# Patient Record
Sex: Male | Born: 1960 | Race: White | Hispanic: No | Smoking: Never smoker
Health system: Southern US, Community
[De-identification: ages and names within clinical notes are randomized; demographics above are authoritative.]

---

## 2010-08-05 DIAGNOSIS — Z86711 Personal history of pulmonary embolism: Secondary | ICD-10-CM | POA: Insufficient documentation

## 2012-06-28 DIAGNOSIS — I10 Essential (primary) hypertension: Secondary | ICD-10-CM | POA: Insufficient documentation

## 2012-07-02 DIAGNOSIS — E119 Type 2 diabetes mellitus without complications: Secondary | ICD-10-CM | POA: Insufficient documentation

## 2012-09-27 DIAGNOSIS — R011 Cardiac murmur, unspecified: Secondary | ICD-10-CM | POA: Insufficient documentation

## 2013-09-12 DIAGNOSIS — N529 Male erectile dysfunction, unspecified: Secondary | ICD-10-CM | POA: Insufficient documentation

## 2014-03-01 DIAGNOSIS — Z7901 Long term (current) use of anticoagulants: Secondary | ICD-10-CM | POA: Insufficient documentation

## 2019-01-15 ENCOUNTER — Encounter: Payer: Self-pay | Admitting: Psychiatry

## 2019-01-15 ENCOUNTER — Ambulatory Visit (INDEPENDENT_AMBULATORY_CARE_PROVIDER_SITE_OTHER): Payer: 59 | Admitting: Psychiatry

## 2019-01-15 ENCOUNTER — Other Ambulatory Visit: Payer: Self-pay

## 2019-01-15 DIAGNOSIS — F4323 Adjustment disorder with mixed anxiety and depressed mood: Secondary | ICD-10-CM
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
## 2019-01-22 ENCOUNTER — Ambulatory Visit (INDEPENDENT_AMBULATORY_CARE_PROVIDER_SITE_OTHER): Payer: 59 | Admitting: Psychiatry

## 2019-01-22 ENCOUNTER — Other Ambulatory Visit: Payer: Self-pay

## 2019-01-22 ENCOUNTER — Encounter: Payer: Self-pay | Admitting: Psychiatry

## 2019-01-22 DIAGNOSIS — F4323 Adjustment disorder with mixed anxiety and depressed mood: Secondary | ICD-10-CM

## 2019-01-22 NOTE — Progress Notes (Signed)
      [facility] Counselor/Therapist Progress Note  Patient ID: Allen Gregory, MRN: [id number],    Date: 01/22/2019  Time Spent: 50 minutes   Treatment Type: Individual Therapy  Reported Symptoms: anxiety, sadness.  Mental Status Exam:  Appearance:   Casual     Behavior:  Appropriate  Motor:  Normal  Speech/Language:   Clear and Coherent  Affect:  Appropriate  Mood:  anxious and sad  Thought process:  normal  Thought content:    WNL  Sensory/Perceptual disturbances:    WNL  Orientation:  oriented to person, place, time/date and situation  Attention:  Good  Concentration:  Good  Memory:  WNL  Fund of knowledge:   Good  Insight:    Good  Judgment:   Good  Impulse Control:  Good   Risk Assessment: Danger to Self:  No Self-injurious Behavior: No Danger to Others: No Duty to Warn:no Physical Aggression / Violence:No  Access to Firearms a concern: No  Gang Involvement:No   Subjective: The client states that he has done some guided meditations online which have been very helpful.  We also discussed mindfulness skills.  I gave the client a handout on different things that he could do.  We also discussed thinking errors and ways to set appropriate boundaries with others.  The client has handouts on both subjects. Today we started with eye-movement around his anxiety with sexual performance.  His negative cognition was, "I am failing.  I am not a man."  His level of distress was a 6+.  At the end of the session it was less than 3.  The client was able to note that he has been successful sexually in the past.  We discussed being able to stay in the present tense and not worry about what his body is doing but what his body is feeling.  As the client used the hand paddles he was able to decrease his anxiety to below a 3.  I took the client through mindfulness exercise which helped him relax.  I also discussed with him the 1-2 ratio to use with breathing when he practices that.  He states  he will try to do the mindfulness practices twice a day.  Interventions: Assertiveness/Communication, Mindfulness Meditation, Motivational Interviewing, Solution-Oriented/Positive Psychology, [facility] Desensitization and Reprocessing (EMDR) and Insight-Oriented  Diagnosis:   ICD-10-CM   1. Adjustment disorder with mixed anxiety and depressed mood  F43.23     Plan: Mindfulness, boundaries, assertiveness, thinking errors, exercise.  This record has been created using [employer].  Chart creation errors have been sought, but Adaysha Dubinsky not always have been located and corrected. Such creation errors do not reflect on the standard of medical care.  Nazanin Kinner, [facility]

## 2019-02-13 ENCOUNTER — Encounter: Payer: Self-pay | Admitting: Psychiatry

## 2019-02-13 ENCOUNTER — Ambulatory Visit (INDEPENDENT_AMBULATORY_CARE_PROVIDER_SITE_OTHER): Payer: 59 | Admitting: Psychiatry

## 2019-02-13 ENCOUNTER — Other Ambulatory Visit: Payer: Self-pay

## 2019-02-13 DIAGNOSIS — F4323 Adjustment disorder with mixed anxiety and depressed mood: Secondary | ICD-10-CM | POA: Diagnosis not present
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
## 2019-02-19 ENCOUNTER — Ambulatory Visit: Payer: 59 | Admitting: Psychiatry

## 2019-03-02 ENCOUNTER — Ambulatory Visit: Payer: 59 | Admitting: Psychiatry

## 2019-03-09 ENCOUNTER — Ambulatory Visit: Payer: 59 | Admitting: Psychiatry

## 2019-03-16 ENCOUNTER — Ambulatory Visit: Payer: 59 | Admitting: Psychiatry

## 2019-07-09 ENCOUNTER — Other Ambulatory Visit: Payer: Self-pay

## 2019-07-09 ENCOUNTER — Encounter (HOSPITAL_BASED_OUTPATIENT_CLINIC_OR_DEPARTMENT_OTHER): Payer: 59 | Attending: Internal Medicine | Admitting: Internal Medicine

## 2019-07-09 DIAGNOSIS — Z7901 Long term (current) use of anticoagulants: Secondary | ICD-10-CM | POA: Insufficient documentation

## 2019-07-09 DIAGNOSIS — Z96641 Presence of right artificial hip joint: Secondary | ICD-10-CM | POA: Diagnosis not present

## 2019-07-09 DIAGNOSIS — L97512 Non-pressure chronic ulcer of other part of right foot with fat layer exposed: Secondary | ICD-10-CM | POA: Diagnosis not present

## 2019-07-09 DIAGNOSIS — E1142 Type 2 diabetes mellitus with diabetic polyneuropathy: Secondary | ICD-10-CM | POA: Diagnosis not present

## 2019-07-09 DIAGNOSIS — I10 Essential (primary) hypertension: Secondary | ICD-10-CM | POA: Diagnosis not present

## 2019-07-09 DIAGNOSIS — E1151 Type 2 diabetes mellitus with diabetic peripheral angiopathy without gangrene: Secondary | ICD-10-CM | POA: Diagnosis not present

## 2019-07-09 DIAGNOSIS — E11621 Type 2 diabetes mellitus with foot ulcer: Secondary | ICD-10-CM | POA: Diagnosis present

## 2019-07-09 DIAGNOSIS — Z86711 Personal history of pulmonary embolism: Secondary | ICD-10-CM | POA: Insufficient documentation

## 2019-07-09 DIAGNOSIS — Z86718 Personal history of other venous thrombosis and embolism: Secondary | ICD-10-CM | POA: Diagnosis not present

## 2019-07-09 NOTE — Progress Notes (Signed)
Allen, Gregory ([id number]) Visit Report for 07/09/2019 Abuse/Suicide Risk Screen Details Patient Name: Date of Service: Allen Gregory, Allen Gregory 07/09/2019 2:45 PM Medical Record [id number] Patient Account Number: [id number] Date of Birth/Sex: Treating RN: 31-Aug-1960 (59 y.o. Male) Baruch Gouty Primary Care Esther Broyles: PATIENT, NO Other Clinician: Referring Kentrell Guettler: Treating Salvadore Valvano/Extender:Robson, Esperanza Richters in Treatment: 0 Abuse/Suicide Risk Screen Items Answer ABUSE RISK SCREEN: Has anyone close to you tried to hurt or harm you recentlyo No Do you feel uncomfortable with anyone in your familyo No Electronic Signature(s) Signed: 07/09/2019 5:35:37 PM By: Baruch Gouty RN, BSN Entered By: Baruch Gouty on 07/09/2019 15:03:11 -------------------------------------------------------------------------------- Activities of Daily Living Details Patient Name: Date of Service: Allen, Gregory 07/09/2019 2:45 PM Medical Record [id number] Patient Account Number: [id number] Date of Birth/Sex: Treating RN: [birth year] (59 y.o. Male) Baruch Gouty Primary Care Jaton Eilers: PATIENT, NO Other Clinician: Referring Davonte Siebenaler: Treating La Shehan/Extender:Robson, Esperanza Richters in Treatment: 0 Activities of Daily Living Items Answer Activities of Daily Living (Please select one for each item) Drive Automobile Completely Able Take Medications Completely Able Use Telephone Completely Able Care for Appearance Completely Able Use Toilet Completely Able Bath / Shower Completely Able Dress Self Completely Able Feed Self Completely Able Walk Completely Able Get In / Out Bed Completely Able Housework Completely Able Prepare Meals Completely Able Handle Money Completely Able Shop for Self Completely Able Electronic Signature(s) Signed: 07/09/2019 5:35:37 PM By: Baruch Gouty RN, BSN Entered By: Baruch Gouty on 07/09/2019  15:03:47 -------------------------------------------------------------------------------- Education Screening Details Patient Name: Date of Service: Allen, Gregory 07/09/2019 2:45 PM Medical Record [id number] Patient Account Number: [id number] Date of Birth/Sex: Treating RN: [birth year] (59 y.o. Male) Baruch Gouty Primary Care Parveen Freehling: PATIENT, NO Other Clinician: Referring Makila Colombe: Treating Gwynneth Fabio/Extender:Robson, Esperanza Richters in Treatment: 0 Primary Learner Assessed: Patient Learning Preferences/Education Level/Primary Language Learning Preference: Explanation, Demonstration, Printed Material Highest Education Level: College or Above Preferred Language: Diplomatic Services operational officer Language Barrier: No Translator Needed: No Memory Deficit: No Emotional Barrier: No Cultural/Religious Beliefs Affecting Medical Care: No Physical Barrier Impaired Vision: Yes Contacts Impaired Hearing: No Decreased Hand dexterity: No Knowledge/Comprehension Knowledge Level: High Comprehension Level: High Ability to understand written High instructions: Ability to understand verbal High instructions: Motivation Anxiety Level: Calm Cooperation: Cooperative Education Importance: Acknowledges Need Interest in Health Problems: Asks Questions Perception: Coherent Willingness to Engage in Self- High Management Activities: Readiness to Engage in Self- High Management Activities: Electronic Signature(s) Signed: 07/09/2019 5:35:37 PM By: Baruch Gouty RN, BSN Entered By: Baruch Gouty on 07/09/2019 15:04:16 -------------------------------------------------------------------------------- Fall Risk Assessment Details Patient Name: Date of Service: Allen, Gregory 07/09/2019 2:45 PM Medical Record [id number] Patient Account Number: [id number] Date of Birth/Sex: Treating RN: 1960-10-01 (59 y.o. Male) Baruch Gouty Primary Care Daxon Kyne: PATIENT, NO Other Clinician: Referring  Lore Polka: Treating Emran Molzahn/Extender:Robson, Esperanza Richters in Treatment: 0 Fall Risk Assessment Items Have you had 2 or more falls in the last 12 monthso 0 No Have you had any fall that resulted in injury in the last 12 monthso 0 No FALLS RISK SCREEN History of falling - immediate or within 3 months 0 No Secondary diagnosis (Do you have 2 or more medical diagnoseso) 0 No Ambulatory aid None/bed rest/wheelchair/nurse 0 Yes Crutches/cane/walker 0 No Furniture 0 No Intravenous therapy Access/Saline/Heparin Lock 0 No Gait/Transferring Normal/ bed rest/ wheelchair 0 Yes Impaired (short steps with shuffle, may have difficulty arising from chair, 0 No head down, impaired balance) Mental Status Oriented to own ability 0 Yes Overestimates or forgets limitations 0 No Risk Level: Low Risk Score: 0 Electronic Signature(s) Signed: 07/09/2019  5:35:37 PM By: Zenaida Deed RN, BSN Entered By: Zenaida Deed on 07/09/2019 15:04:46 -------------------------------------------------------------------------------- Foot Assessment Details Patient Name: Date of Service: Allen, Gregory 07/09/2019 2:45 PM Medical Record [id number] Patient Account Number: [id number] Date of Birth/Sex: Treating RN: Dec 31, 1960 (59 y.o. Male) Zenaida Deed Primary Care Maxamus Colao: PATIENT, NO Other Clinician: Referring Sophi Calligan: Treating Sherrita Riederer/Extender:Robson, [name] in Treatment: 0 Foot Assessment Items Site Locations + = Sensation present, - = Sensation absent, C = Callus, U = Ulcer R = Redness, W = Warmth, M = Maceration, PU = Pre-ulcerative lesion F = Fissure, S = Swelling, D = Dryness Assessment Right: Left: Other Deformity: No No Prior Foot Ulcer: No No Prior Amputation: No No Charcot Joint: No No Ambulatory Status: Ambulatory Without Help Gait: Steady Electronic Signature(s) Signed: 07/09/2019 5:35:37 PM By: Zenaida Deed RN, BSN Entered By: Zenaida Deed on 07/09/2019  15:07:42 -------------------------------------------------------------------------------- Nutrition Risk Screening Details Patient Name: Date of Service: Allen, Gregory 07/09/2019 2:45 PM Medical Record [id number] Patient Account Number: [id number] Date of Birth/Sex: Treating RN: [birth year] (58 y.o. Male) Zenaida Deed Primary Care Tyronica Truxillo: PATIENT, NO Other Clinician: Referring Alisandra Son: Treating Amed Datta/Extender:Robson, [name] in Treatment: 0 Height (in): Weight (lbs): Body Mass Index (BMI): Nutrition Risk Screening Items Score Screening NUTRITION RISK SCREEN: I have an illness or condition that made me change the kind and/or 0 No amount of food I eat I eat fewer than two meals per day 0 No I eat few fruits and vegetables, or milk products 0 No I have three or more drinks of beer, liquor or wine almost every day 0 No I have tooth or mouth problems that make it hard for me to eat 0 No I don't always have enough money to buy the food I need 0 No I eat alone most of the time 1 Yes I take three or more different prescribed or over-the-counter drugs a day 1 Yes 0 No Without wanting to, I have lost or gained 10 pounds in the last six months I am not always physically able to shop, cook and/or feed myself 0 No Nutrition Protocols Good Risk Protocol 0 No interventions needed Moderate Risk Protocol High Risk Proctocol Risk Level: Good Risk Score: 2 Electronic Signature(s) Signed: 07/09/2019 5:35:37 PM By: Zenaida Deed RN, BSN Entered By: Zenaida Deed on 07/09/2019 15:05:23

## 2019-07-10 NOTE — Progress Notes (Signed)
Allen Gregory, Tania ([id number]) Visit Report for 07/09/2019 Chief Complaint Document Details Patient Name: Date of Service: Allen Gregory, Allen Gregory 07/09/2019 2:45 PM Medical Record [id number] Patient Account Number: [id number] Date of Birth/Sex: Treating RN: 03/08/1961 (59 y.o. Male) Zandra AbtsLynch, Shatara Primary Care Provider: PATIENT, NO Other Clinician: Referring Provider: Treating Provider/Extender:Lassie Demorest, Lamar SprinklesMichael Weeks in Treatment: 0 Information Obtained from: Patient Chief Complaint 07/09/2019; patient is here for review of wounds on the right plantar first toe and the dorsal right fifth toe Electronic Signature(s) Signed: 07/10/2019 4:08:35 PM By: Baltazar Najjarobson, Laurali Goddard MD Entered By: Baltazar Najjarobson, Alexandria Current on 07/09/2019 16:37:48 -------------------------------------------------------------------------------- Debridement Details Patient Name: Date of Service: Allen Gregory, Allen Gregory 07/09/2019 2:45 PM Medical Record [id number] Patient Account Number: [id number] Date of Birth/Sex: 06/30/1960 [age] y.o. Male) Treating RN: Zandra AbtsLynch, Shatara Primary Care Provider: PATIENT, NO Other Clinician: Referring Provider: Treating Provider/Extender:Jerni Selmer, Lamar SprinklesMichael Weeks in Treatment: 0 Debridement Performed for Wound #1 Right Toe Great Assessment: Performed By: Physician Maxwell Caulobson, Dailan Pfalzgraf G., MD Debridement Type: Debridement Severity of Tissue Pre Fat layer exposed Debridement: Level of Consciousness (Pre- Awake and Alert procedure): Pre-procedure Verification/Time Out Taken: Yes - 16:20 Start Time: 16:20 Total Area Debrided (L x W): 1 (cm) x 0.6 (cm) = 0.6 (cm) Tissue and other material Viable, Non-Viable, Callus, Subcutaneous debrided: Level: Skin/Subcutaneous Tissue Debridement Description: Excisional Instrument: Curette Bleeding: Minimum Hemostasis Achieved: Pressure End Time: 16:21 Procedural Pain: 0 Post Procedural Pain: 0 Response to Treatment: Procedure was tolerated well Level of Consciousness Awake and  Alert (Post-procedure): Post Debridement Measurements of Total Wound Length: (cm) 1 Width: (cm) 0.6 Depth: (cm) 0.5 Volume: (cm) 0.236 Character of Wound/Ulcer Post Improved Debridement: Severity of Tissue Post Debridement: Fat layer exposed Post Procedure Diagnosis Same as Pre-procedure Electronic Signature(s) Signed: 07/09/2019 6:02:32 PM By: Zandra AbtsLynch, Shatara RN, BSN Signed: 07/10/2019 4:08:35 PM By: Baltazar Najjarobson, Anandi Abramo MD Entered By: Baltazar Najjarobson, Jaquay Morneault on 07/09/2019 16:37:11 -------------------------------------------------------------------------------- Debridement Details Patient Name: Date of Service: Allen Gregory, Allen Gregory 07/09/2019 2:45 PM Medical Record [id number] Patient Account Number: [id number] Date of Birth/Sex: 10/31/1960 [age] y.o. Male) Treating RN: Zandra AbtsLynch, Shatara Primary Care Provider: PATIENT, NO Other Clinician: Referring Provider: Treating Provider/Extender:Gelila Well, Lamar SprinklesMichael Weeks in Treatment: 0 Debridement Performed for Wound #2 Right Toe Fifth Assessment: Performed By: Physician Maxwell Caulobson, Antonieta Slaven G., MD Debridement Type: Debridement Severity of Tissue Pre Fat layer exposed Debridement: Level of Consciousness (Pre- Awake and Alert procedure): Pre-procedure Verification/Time Out Taken: Yes - 16:20 Start Time: 16:22 Total Area Debrided (L x W): 0.4 (cm) x 0.2 (cm) = 0.08 (cm) Tissue and other material Viable, Non-Viable, Callus, Subcutaneous debrided: Level: Skin/Subcutaneous Tissue Debridement Description: Excisional Instrument: Curette Bleeding: Minimum Hemostasis Achieved: Pressure End Time: 16:24 Procedural Pain: 0 Post Procedural Pain: 0 Response to Treatment: Procedure was tolerated well Level of Consciousness Awake and Alert (Post-procedure): Post Debridement Measurements of Total Wound Length: (cm) 0.4 Width: (cm) 0.2 Depth: (cm) 0.1 Volume: (cm) 0.006 Character of Wound/Ulcer Post Improved Debridement: Severity of Tissue Post Debridement: Fat  layer exposed Post Procedure Diagnosis Same as Pre-procedure Electronic Signature(s) Signed: 07/09/2019 6:02:32 PM By: Zandra AbtsLynch, Shatara RN, BSN Signed: 07/10/2019 4:08:35 PM By: Baltazar Najjarobson, Ayson Cherubini MD Entered By: Baltazar Najjarobson, Keelia Graybill on 07/09/2019 16:37:19 -------------------------------------------------------------------------------- HPI Details Patient Name: Date of Service: Allen Gregory, Allen Gregory 07/09/2019 2:45 PM Medical Record [id number] Patient Account Number: [id number] Date of Birth/Sex: Treating RN: 12/27/1960 (59 y.o. Male) Zandra AbtsLynch, Shatara Primary Care Provider: PATIENT, NO Other Clinician: Referring Provider: Treating Provider/Extender:Shareen Capwell, Lamar SprinklesMichael Weeks in Treatment: 0 History of Present Illness HPI Description: ADMISSION 07/09/2019 This is a pleasant 59 year old man who referred himself here for second opinion  sign a new area on the right plantar first toe and the dorsal part of his right fifth toe. He says he had these in August when he had removed callus from the first toe and then played pool volleyball for about 4 hours. He thinks the bottom of the pool simply caused an abrasion of the toe. The story sounds accurate. He has been going to podiatry for the last several months. Me a picture on his phone from August and the wound is gotten considerably smaller. He states that it started doing well recently when they started collagen. He has been using a surgical shoe to offload. 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 He had a brace for a long time but now he simply lifts his foot higher to clear but states that most people would not even notice that he had any disability. He is a type 2 diabetes Badik but he has diet-controlled he has lost weight by watching calories. Past medical history type 2 diabetes diet controlled, right foot  drop is noted, right total hip replacement x2, history of PE on Xarelto chronically ABI in our clinic was 1.03 on the right. Electronic Signature(s) Signed: 07/10/2019 4:08:35 PM By: Baltazar Najjar MD Entered By: Baltazar Najjar on 07/09/2019 16:40:40 -------------------------------------------------------------------------------- Physical Exam Details Patient Name: Date of Service: Allen Gregory, Allen Gregory 07/09/2019 2:45 PM Medical Record [id number] Patient Account Number: [id number] Date of Birth/Sex: Treating RN: 01/03/1961 (59 y.o. Male) Zandra Abts Primary Care Provider: PATIENT, NO Other Clinician: Referring Provider: Treating Provider/Extender:Annaleah Arata, [name] in Treatment: 0 Constitutional Patient is hypertensive.. Pulse regular and within target range for patient.Marland Kitchen Respirations regular, non-labored and within target range.. Temperature is normal and within the target range for the patient.Marland Kitchen Appears in no distress. Respiratory work of breathing is normal. Cardiovascular Both dorsalis pedis posterior tibial and popliteal pulses are robust. Neurological Patient has right foot drop however he manages to do fairly well with his surgical shoe. Psychiatric appears at normal baseline. Notes Wound exam; the patient's wound is on the plantar right great toe over the interphalangeal joint. Raised edges of skin and subcutaneous tissue removed with a #3 curette. The base of this actually looks quite healthy. There is no exposed bone no overt evidence of surrounding infection. Over the fifth toe dorsally over the possible inner phalangeal joint callused skin removed with a #3 curette there is still a small open area removed remaining here. Electronic Signature(s) Signed: 07/10/2019 4:08:35 PM By: Baltazar Najjar MD Entered By: Baltazar Najjar on 07/09/2019 16:56:30 -------------------------------------------------------------------------------- Physician Orders Details Patient  Name: Date of Service: Allen Gregory, Allen Gregory 07/09/2019 2:45 PM Medical Record [id number] Patient Account Number: [id number] Date of Birth/Sex: Treating RN: 02-25-1961 (58 y.o. Male) Zandra Abts Primary Care Provider: PATIENT, NO Other Clinician: Referring Provider: Treating Provider/Extender:Carys Malina, [name] in Treatment: 0 Verbal / Phone Orders: No Diagnosis Coding Follow-up Appointments Return Appointment in 1 week. Dressing Change Frequency Wound #1 Right Toe Great Change Dressing every other day. - may change daily if needed for excess drainage Wound #2 Right Toe Fifth Change Dressing every other day. - may change daily if needed for excess drainage Wound Cleansing May shower and wash wound with soap and water. - on days that dressing is changed Primary Wound Dressing Wound #1 Right Toe Great Silver Collagen - moisten with hydrogel (or KY jelly) Wound #2 Right  Toe Fifth Silver Collagen - moisten with hydrogel (or KY jelly) Secondary Dressing Wound #1 Right Toe Great Foam - foam donut Kerlix/Rolled Gauze Dry Gauze Other: - felt in surgical shoe Wound #2 Right Toe Fifth Foam - foam donut Kerlix/Rolled Gauze Dry Gauze Other: - felt in surgical shoe Electronic Signature(s) Signed: 07/09/2019 6:02:32 PM By: Zandra AbtsLynch, Shatara RN, BSN Signed: 07/10/2019 4:08:35 PM By: Baltazar Najjarobson, Micai Apolinar MD Entered By: Zandra AbtsLynch, Shatara on 07/09/2019 16:27:11 -------------------------------------------------------------------------------- Problem List Details Patient Name: Date of Service: Allen Gregory, Allen Gregory 07/09/2019 2:45 PM Medical Record [id number] Patient Account Number: [id number] Date of Birth/Sex: Treating RN: 10/26/1960 (59 y.o. Male) Zandra AbtsLynch, Shatara Primary Care Provider: PATIENT, NO Other Clinician: Referring Provider: Treating Provider/Extender:Liah Morr, Lamar SprinklesMichael Weeks in Treatment: 0 Active Problems ICD-10 Evaluated Encounter Code Description Active Date Today Diagnosis E11.621  Type 2 diabetes mellitus with foot ulcer 07/09/2019 No Yes L97.512 Non-pressure chronic ulcer of other part of right foot 07/09/2019 No Yes with fat layer exposed E11.42 Type 2 diabetes mellitus with diabetic polyneuropathy 07/09/2019 No Yes M21.371 Foot drop, right foot 07/09/2019 No Yes Inactive Problems Resolved Problems Electronic Signature(s) Signed: 07/10/2019 4:08:35 PM By: Baltazar Najjarobson, Brynlynn Walko MD Entered By: Baltazar Najjarobson, Reace Breshears on 07/09/2019 16:36:50 -------------------------------------------------------------------------------- Progress Note Details Patient Name: Date of Service: Allen Gregory, Allen Gregory 07/09/2019 2:45 PM Medical Record [id number] Patient Account Number: [id number] Date of Birth/Sex: Treating RN: 01/01/1961 (59 y.o. Male) Zandra AbtsLynch, Shatara Primary Care Provider: PATIENT, NO Other Clinician: Referring Provider: Treating Provider/Extender:Olivier Frayre, Lamar SprinklesMichael Weeks in Treatment: 0 Subjective Chief Complaint Information obtained from Patient 07/09/2019; patient is here for review of wounds on the right plantar first toe and the dorsal right fifth toe History of Present Illness (HPI) ADMISSION 07/09/2019 This is a pleasant 59 year old man who referred himself here for second opinion sign a new area on the right plantar first toe and the dorsal part of his right fifth toe. He says he had these in August when he had removed callus from the first toe and then played pool volleyball for about 4 hours. He thinks the bottom of the pool simply caused an abrasion of the toe. The story sounds accurate. He has been going to podiatry for the last several months. Me a picture on his phone from August and the wound is gotten considerably smaller. He states that it started doing well recently when they started collagen. He has been using a surgical shoe to offload. 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 He had a brace for a long time but now he simply lifts his foot higher to clear but states that most people would not even notice that he had any disability. He is a type 2 diabetes Badik but he has diet-controlled he has lost weight by watching calories. 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 Patient History Information obtained from Patient. Allergies lisinopril (Reaction: cough) Family History Cancer - Maternal Grandparents,Paternal Grandparents, Diabetes - Mother, Heart Disease - Father, Hypertension - Father,Siblings, No family history of Kidney Disease, Lung Disease, Seizures, Stroke, Thyroid Problems, Tuberculosis. Social History Never smoker, Marital Status - Single, Alcohol Use - Moderate - 4-5 drinks per week, Drug Use - No History, Caffeine Use - Daily - coffee, diet coke. Medical History Respiratory  Patient has history of Pneumothorax - CPAP Cardiovascular Patient has history of Deep Vein Thrombosis Endocrine Patient has history of Type II Diabetes Denies history of Type I Diabetes Genitourinary Denies history of End Stage Renal Disease Integumentary (Skin) Denies history of History of Burn Neurologic Patient has history of Neuropathy Oncologic Denies history of Received Chemotherapy, Received Radiation Psychiatric Denies history of Anorexia/bulimia, Confinement Anxiety Patient is treated with Controlled Diet. Blood sugar is not tested. Hospitalization/Surgery History - right hip replacement x2. - hydrocele repair. Medical And Surgical History Notes Cardiovascular hx PE Neurologic right foot drop Review of Systems (ROS) Constitutional Symptoms (General Health) Denies complaints or symptoms of Fatigue, Fever, Chills, Marked Weight  Change. Eyes Complains or has symptoms of Glasses / Contacts - contacts. Denies complaints or symptoms of Dry Eyes, Vision Changes. Ear/Nose/Mouth/Throat Denies complaints or symptoms of Chronic sinus problems or rhinitis. Respiratory Denies complaints or symptoms of Chronic or frequent coughs, Shortness of Breath. Cardiovascular Denies complaints or symptoms of Chest pain. Gastrointestinal Denies complaints or symptoms of Frequent diarrhea, Nausea, Vomiting. Genitourinary Denies complaints or symptoms of Frequent urination. Integumentary (Skin) Complains or has symptoms of Wounds - right great toe. Musculoskeletal Complains or has symptoms of Muscle Weakness - right leg. Denies complaints or symptoms of Muscle Pain. Neurologic Complains or has symptoms of Numbness/parasthesias. Psychiatric Denies complaints or symptoms of Claustrophobia, Suicidal. Objective Constitutional Patient is hypertensive.. Pulse regular and within target range for patient.Marland Kitchen Respirations regular, non-labored and within target range.. Temperature is normal and within the target range for the patient.Marland Kitchen Appears in no distress. Vitals Time Taken: 3:00 PM, Height: 78 in, Source: Stated, Weight: 285 lbs, Source: Stated, BMI: 32.9, Temperature: 98.6 F, Pulse: 79 bpm, Respiratory Rate: 18 breaths/min, Blood Pressure: 149/76 mmHg. Respiratory work of breathing is normal. Cardiovascular Both dorsalis pedis posterior tibial and popliteal pulses are robust. Neurological Patient has right foot drop however he manages to do fairly well with his surgical shoe. Psychiatric appears at normal baseline. General Notes: Wound exam; the patient's wound is on the plantar right great toe over the interphalangeal joint. Raised edges of skin and subcutaneous tissue removed with a #3 curette. The base of this actually looks quite healthy. There is no exposed bone no overt evidence of surrounding infection. ooOver the fifth toe  dorsally over the possible inner phalangeal joint callused skin removed with a #3 curette there is still a small open area removed remaining here. Integumentary (Hair, Skin) Wound #1 status is Open. Original cause of wound was Gradually Appeared. The wound is located on the Right Toe Great. The wound measures 1cm length x 0.6cm width x 0.5cm depth; 0.471cm^2 area and 0.236cm^3 volume. There is Fat Layer (Subcutaneous Tissue) Exposed exposed. There is no tunneling noted, however, there is undermining starting at 12:00 and ending at 12:00 with a maximum distance of 0.4cm. There is a small amount of serosanguineous drainage noted. The wound margin is thickened. There is large (67-100%) red granulation within the wound bed. There is no necrotic tissue within the wound bed. Wound #2 status is Open. Original cause of wound was Trauma. The wound is located on the Right Toe Fifth. The wound measures 0.4cm length x 0.2cm width x 0.1cm depth; 0.063cm^2 area and 0.006cm^3 volume. The wound is limited to skin breakdown. There is no tunneling or undermining noted. There is a none present amount of drainage noted. The wound margin is flat and intact. There is no granulation within the wound bed. There is a small (1-33%) amount  of necrotic tissue within the wound bed including Eschar. 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 There was a Excisional Skin/Subcutaneous Tissue Debridement with a total area of 0.6 sq cm performed by Maxwell Caul., MD. With the following instrument(s): Curette to remove Viable and Non-Viable  tissue/material. Material removed includes Callus and Subcutaneous Tissue and. No specimens were taken. A time out was conducted at 16:20, prior to the start of the procedure. A Minimum amount of bleeding was controlled with Pressure. The procedure was tolerated well with a pain level of 0 throughout and a pain level of 0 following the procedure. Post Debridement Measurements: 1cm length x 0.6cm width x 0.5cm depth; 0.236cm^3 volume. Character of Wound/Ulcer Post Debridement is improved. Severity of Tissue Post Debridement is: Fat layer exposed. Post procedure Diagnosis Wound #1: Same as Pre-Procedure Wound #2 Pre-procedure diagnosis of Wound #2 is a Diabetic Wound/Ulcer of the Lower Extremity located on the Right Toe Fifth .Severity of Tissue Pre Debridement is: Fat layer exposed. There was a Excisional Skin/Subcutaneous Tissue Debridement with a total area of 0.08 sq cm performed by Maxwell Caul., MD. With the following instrument(s): Curette to remove Viable and Non-Viable tissue/material. Material removed includes Callus and Subcutaneous Tissue and. No specimens were taken. A time out was conducted at 16:20, prior to the start of the procedure. A Minimum amount of bleeding was controlled with Pressure. The procedure was tolerated well with a pain level of 0 throughout and a pain level of 0 following the procedure. Post Debridement Measurements: 0.4cm length x 0.2cm width x 0.1cm depth; 0.006cm^3 volume. Character of Wound/Ulcer Post Debridement is improved. Severity of Tissue Post Debridement is: Fat layer exposed. Post procedure Diagnosis Wound #2: Same as Pre-Procedure Plan Follow-up Appointments: Return Appointment in 1 week. Dressing Change Frequency: Wound #1 Right Toe Great: Change Dressing every other day. - may change daily if needed for excess drainage Wound #2 Right Toe Fifth: Change Dressing every other day. - may change daily if needed for excess drainage Wound  Cleansing: May shower and wash wound with soap and water. - on days that dressing is changed Primary Wound Dressing: Wound #1 Right Toe Great: Silver Collagen - moisten with hydrogel (or KY jelly) Wound #2 Right Toe Fifth: Silver Collagen - moisten with hydrogel (or KY jelly) Secondary Dressing: Wound #1 Right Toe Great: Foam - foam donut Kerlix/Rolled Gauze Dry Gauze Other: - felt in surgical shoe Wound #2 Right Toe Fifth: Foam - foam donut Kerlix/Rolled Gauze Dry Gauze Other: - felt in surgical shoe 1. I agree with the silver collagen 2. We felted up his surgical shoe to offload the right great toe 3. He states that he had an x-ray about 2 weeks ago and was told that there is "no osteomyelitis". Again this was at podiatry are not available for review 4. I watched his gait he certainly is not that unsteady. I wonder whether he could manage a total contact cast or not. He is offloading this with a scooter at home but he is still going out to grocery stores etc. 5. I saw no evidence of an arterial issue at the bedside or  based on the ABI in this clinic. I spent 35 minutes in the review of this patient's records, face-to-face review and preparation of this record. Electronic Signature(s) Signed: 07/10/2019 4:08:35 PM By: Baltazar Najjar MD Entered By: Baltazar Najjar on 07/09/2019 16:59:04 -------------------------------------------------------------------------------- HxROS Details Patient Name: Date of Service: Allen Gregory, Allen Gregory 07/09/2019 2:45 PM Medical Record [id number] Patient Account Number: [id number] Date of Birth/Sex: Treating RN: 1961/01/28 (58 y.o. Male) Allen Gregory Primary Care Provider: PATIENT, NO Other Clinician: Referring Provider: Treating Provider/Extender:Laira Penninger, [name] in Treatment: 0 Information Obtained From Patient Constitutional Symptoms (General Health) Complaints and Symptoms: Negative for: Fatigue; Fever; Chills; Marked Weight  Change Eyes Complaints and Symptoms: Positive for: Glasses / Contacts - contacts Negative for: Dry Eyes; Vision Changes Ear/Nose/Mouth/Throat Complaints and Symptoms: Negative for: Chronic sinus problems or rhinitis Respiratory Complaints and Symptoms: Negative for: Chronic or frequent coughs; Shortness of Breath Medical History: Positive for: Pneumothorax - CPAP Cardiovascular Complaints and Symptoms: Negative for: Chest pain Medical History: Positive for: Deep Vein Thrombosis Past Medical History Notes: hx PE Gastrointestinal Complaints and Symptoms: Negative for: Frequent diarrhea; Nausea; Vomiting Genitourinary Complaints and Symptoms: Negative for: Frequent urination Medical History: Negative for: End Stage Renal Disease Integumentary (Skin) Complaints and Symptoms: Positive for: Wounds - right great toe Medical History: Negative for: History of Burn Musculoskeletal Complaints and Symptoms: Positive for: Muscle Weakness - right leg Negative for: Muscle Pain Neurologic Complaints and Symptoms: Positive for: Numbness/parasthesias Medical History: Positive for: Neuropathy Past Medical History Notes: right foot drop Psychiatric Complaints and Symptoms: Negative for: Claustrophobia; Suicidal Medical History: Negative for: Anorexia/bulimia; Confinement Anxiety Hematologic/Lymphatic Endocrine Medical History: Positive for: Type II Diabetes Negative for: Type I Diabetes Time with diabetes: 1 yr Treated with: Diet Blood sugar tested every day: No Immunological Oncologic Medical History: Negative for: Received Chemotherapy; Received Radiation Immunizations Pneumococcal Vaccine: Received Pneumococcal Vaccination: No Implantable Devices Yes Hospitalization / Surgery History Type of Hospitalization/Surgery right hip replacement x2 hydrocele repair Family and Social History Cancer: Yes - Maternal Grandparents,Paternal Grandparents; Diabetes: Yes - Mother;  Heart Disease: Yes - Father; Hypertension: Yes - Father,Siblings; Kidney Disease: No; Lung Disease: No; Seizures: No; Stroke: No; Thyroid Problems: No; Tuberculosis: No; Never smoker; Marital Status - Single; Alcohol Use: Moderate - 4-5 drinks per week; Drug Use: No History; Caffeine Use: Daily - coffee, diet coke; Financial Concerns: No; Food, Clothing or Shelter Needs: No; Support System Lacking: No; Transportation Concerns: No Psychologist, prison and probation services) Signed: 07/09/2019 5:35:37 PM By: Allen Deed RN, BSN Signed: 07/10/2019 4:08:35 PM By: Baltazar Najjar MD Entered By: Allen Gregory on 07/09/2019 15:02:55 -------------------------------------------------------------------------------- SuperBill Details Patient Name: Date of Service: PRAISE, DOLECKI 07/09/2019 Medical Record [id number] Patient Account Number: [id number] Date of Birth/Sex: Treating RN: September 06, 1960 (59 y.o. 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 M21.371 Foot drop, right foot Facility Procedures CPT4 Code Description: 57846962 99213 - WOUND CARE VISIT-LEV 3 EST PT Modifier: 25 Quantity: 1 CPT4 Code Description: 95284132 11042 - DEB SUBQ TISSUE 20 SQ CM/< ICD-10 Diagnosis Description L97.512 Non-pressure chronic ulcer of other part of right foot with Modifier: fat layer expo Quantity: 1 sed Physician Procedures CPT4 Code Description: 4401027 WC PHYS LEVEL 3 NEW PT ICD-10 Diagnosis Description E11.621 Type 2 diabetes mellitus with foot ulcer L97.512  Non-pressure chronic ulcer of other part of right foot w E11.42 Type 2 diabetes mellitus with diabetic   polyneuropathy M21.371 Foot drop, right foot Modifier: 25 ith fat layer e Quantity: 1 xposed CPT4 Code Description: 4650354 11042 - WC PHYS SUBQ TISS 20 SQ CM ICD-10 Diagnosis Description L97.512 Non-pressure chronic ulcer of other part of right foot with Modifier: fat layer expo Quantity: 1 sed Electronic Signature(s) Signed: 07/09/2019 6:02:32 PM By: Zandra Abts RN, BSN Signed: 07/10/2019 4:08:35 PM By: Baltazar Najjar MD Entered By: Zandra Abts on 07/09/2019 17:49:49

## 2019-07-10 NOTE — Progress Notes (Addendum)
GARLAN, DREWES ([id number]) Visit Report for 07/09/2019 Allergy List Details Patient Name: Date of Service: Allen Gregory, Allen Gregory 07/09/2019 2:45 PM Medical Record [id number] Patient Account Number: [id number] Date of Birth/Sex: Treating RN: [birth year] (59 y.o. Male) Zenaida Deed Primary Care Markell Sciascia: PATIENT, NO Other Clinician: Referring Rian Koon: Treating Sura Canul/Extender:Robson, [name] in Treatment: 0 Allergies Active Allergies lisinopril Reaction: cough Allergy Notes Electronic Signature(s) Signed: 07/09/2019 5:35:37 PM By: Zenaida Deed RN, BSN Entered By: Zenaida Deed on 07/09/2019 15:20:10 -------------------------------------------------------------------------------- Arrival Information Details Patient Name: Date of Service: Allen Gregory, Allen Gregory 07/09/2019 2:45 PM Medical Record [id number] Patient Account Number: [id number] Date of Birth/Sex: Treating RN: 09-09-1960 (58 y.o. Male) Zenaida Deed Primary Care Zunairah Devers: PATIENT, NO Other Clinician: Referring Laqueta Bonaventura: Treating Taris Galindo/Extender:Robson, [name] in Treatment: 0 Visit Information Patient Arrived: Ambulatory Arrival Time: 15:18 Accompanied By: self Transfer Assistance: None Patient Identification Verified: Yes Secondary Verification Process Yes Completed: Patient Requires Transmission-Based No Precautions: Patient Has Alerts: Yes Patient Alerts: Patient on Blood Thinner Electronic Signature(s) Signed: 07/09/2019 5:35:37 PM By: Zenaida Deed RN, BSN Entered By: Zenaida Deed on 07/09/2019 15:18:59 -------------------------------------------------------------------------------- Clinic Level of Care Assessment Details Patient Name: Date of Service: Allen Gregory, Allen Gregory 07/09/2019 2:45 PM Medical Record [id number] Patient Account Number: [id number] Date of Birth/Sex: Treating RN: Oct 03, 1960 (58 y.o. Male) Zandra Abts Primary Care Arden Tinoco: PATIENT, NO Other  Clinician: Referring Azaliyah Kennard: Treating Codi Kertz/Extender:Robson, [name] in Treatment: 0 Clinic Level of Care Assessment Items TOOL 1 Quantity Score X - Use when EandM and Procedure is performed on INITIAL visit 1 0 ASSESSMENTS - Nursing Assessment / Reassessment X - General Physical Exam (combine w/ comprehensive assessment (listed just below) 1 20 when performed on new pt. evals) X - Comprehensive Assessment (HX, ROS, Risk Assessments, Wounds Hx, etc.) 1 25 ASSESSMENTS - Wound and Skin Assessment / Reassessment []  - Dermatologic / Skin Assessment (not related to wound area) 0 ASSESSMENTS - Ostomy and/or Continence Assessment and Care []  - Incontinence Assessment and Management 0 []  - Ostomy Care Assessment and Management (repouching, etc.) 0 PROCESS - Coordination of Care X - Simple Patient / Family Education for ongoing care 1 15 []  - Complex (extensive) Patient / Family Education for ongoing care 0 X - Staff obtains Consents, Records, Test Results / Process Orders 1 10 []  - Staff telephones HHA, Nursing Homes / Clarify orders / etc 0 []  - Routine Transfer to another Facility (non-emergent condition) 0 []  - Routine Hospital Admission (non-emergent condition) 0 X - New Admissions / / Ordering NPWT, Apligraf, etc. 1 15 []  - Emergency Hospital Admission (emergent condition) 0 PROCESS - Special Needs []  - Pediatric / Minor Patient Management 0 []  - Isolation Patient Management 0 []  - Hearing / Language / Visual special needs 0 []  - Assessment of Community assistance (transportation, D/C planning, etc.) 0 []  - Additional assistance / Altered mentation 0 []  - Support Surface(s) Assessment (bed, cushion, seat, etc.) 0 INTERVENTIONS - Miscellaneous []  - External ear exam 0 []  - Patient Transfer (multiple staff / / Similar devices) 0 []  - Simple Staple / Suture removal (25 or less) 0 []  - Complex Staple / Suture removal (26 or more) 0 []  -  Hypo/Hyperglycemic Management (do not check if billed separately) 0 X - Ankle / Brachial Index (ABI) - do not check if billed separately 1 15 Has the patient been seen at the hospital within the last three years: Yes Total Score: 100 Level Of Care: New/Established - Level 3 Electronic Signature(s) Signed: 07/09/2019 6:02:32 PM By: ,  Shatara RN, BSN Entered By: Zandra Abts on 07/09/2019 16:23:59 -------------------------------------------------------------------------------- Lower Extremity Assessment Details Patient Name: Date of Service: Allen Gregory, Allen Gregory 07/09/2019 2:45 PM Medical Record [id number] Patient Account Number: [id number] Date of Birth/Sex: Treating RN: [birth year] (59 y.o. Male) Zenaida Deed Primary Care Ensley Blas: PATIENT, NO Other Clinician: Referring Dewie Ahart: Treating Shameka Aggarwal/Extender:Robson, [name] in Treatment: 0 Edema Assessment Assessed: [Left: No] [Right: No] Edema: [Left: N] [Right: o] Calf Left: Right: Point of Measurement: 38 cm From Medial Instep cm 45.5 cm Ankle Left: Right: Point of Measurement: 10 cm From Medial Instep cm 24 cm Vascular Assessment Pulses: Dorsalis Pedis Palpable: [Right:Yes] Blood Pressure: Brachial: [Right:149] Dorsalis Pedis: 140 Ankle: Posterior Tibial: 154 Ankle Brachial Index: [Right:1.03] Electronic Signature(s) Signed: 07/09/2019 5:35:37 PM By: Zenaida Deed RN, BSN Entered By: Zenaida Deed on 07/09/2019 15:14:22 -------------------------------------------------------------------------------- Multi Wound Chart Details Patient Name: Date of Service: Allen Gregory 07/09/2019 2:45 PM Medical Record [id number] Patient Account Number: [id number] Date of Birth/Sex: Treating RN: [birth year] (59 y.o. Male) Zandra Abts Primary Care Ramiya Delahunty: PATIENT, NO Other Clinician: Referring [place] Natarajan: Treating Chalee Hirota/Extender:Robson, [name] in Treatment: 0 Vital Signs Height(in): 78 Pulse(bpm):  79 Weight(lbs): 285 Blood Pressure(mmHg): 149/76 Body Mass Index(BMI): 33 Temperature(F): 98.6 Respiratory 18 Rate(breaths/min): Photos: [1:No Photos] [2:No Photos] [N/A:N/A] Wound Location: [1:Right Toe Great] [2:Right Toe Fifth] [N/A:N/A] Wounding Event: [1:Gradually Appeared] [2:Trauma] [N/A:N/A] Primary Etiology: [1:Diabetic Wound/Ulcer of the Diabetic Wound/Ulcer of the N/A Lower Extremity] [2:Lower Extremity] Comorbid History: [1:Pneumothorax, Deep Vein Pneumothorax, Deep Vein N/A Thrombosis, Type II Diabetes, Neuropathy] [2:Thrombosis, Type II Diabetes, Neuropathy] Date Acquired: [1:01/20/2019] [2:06/25/2019] [N/A:N/A] Weeks of Treatment: [1:0] [2:0] [N/A:N/A] Wound Status: [1:Open] [2:Open] [N/A:N/A] Measurements L x W x D 1x0.6x0.5 [2:0.4x0.2x0.1] [N/A:N/A] (cm) Area (cm) : [1:0.471] [2:0.063] [N/A:N/A] Volume (cm) : [1:0.236] [2:0.006] [N/A:N/A] Starting Position 1 12 (o'clock): Ending Position 1 [1:12] (o'clock): Maximum Distance 1 [1:0.4] (cm): Undermining: [1:Yes] [2:No] [N/A:N/A] Classification: [1:Grade 1] [2:Grade 1] [N/A:N/A] Exudate Amount: [1:Small] [2:None Present] [N/A:N/A] Exudate Type: [1:Serosanguineous] [2:N/A] [N/A:N/A] Exudate Color: [1:red, brown] [2:N/A] [N/A:N/A] Wound Margin: [1:Thickened] [2:Flat and Intact] [N/A:N/A] Granulation Amount: [1:Large (67-100%)] [2:None Present (0%)] [N/A:N/A] Granulation Quality: [1:Red] [2:N/A] [N/A:N/A] Necrotic Amount: [1:None Present (0%)] [2:Small (1-33%)] [N/A:N/A] Necrotic Tissue: [1:N/A] [2:Eschar] [N/A:N/A] Exposed Structures: [1:Fat Layer (Subcutaneous Tissue) Exposed: Yes Fascia: No Tendon: No Muscle: No Joint: No Bone: No] [2:Fascia: No Fat Layer (Subcutaneous Tissue) Exposed: No Tendon: No Muscle: No Joint: No Bone: No Limited to Skin Breakdown] [N/A:N/A] Epithelialization: [1:None] [2:Small (1-33%)] [N/A:N/A] Debridement: [1:Debridement - Excisional] [2:Debridement - Excisional] [N/A:N/A] Pre-procedure  [1:16:20] [2:16:20] [N/A:N/A] Verification/Time Out Taken: Tissue Debrided: [1:Callus, Subcutaneous] [2:Callus, Subcutaneous] [N/A:N/A] Level: [1:Skin/Subcutaneous Tissue] [2:Skin/Subcutaneous Tissue] [N/A:N/A] Debridement Area (sq cm):0.6 [2:0.08] [N/A:N/A] Instrument: [1:Curette] [2:Curette] [N/A:N/A] Bleeding: [1:Minimum] [2:Minimum] [N/A:N/A] Hemostasis Achieved: [1:Pressure] [2:Pressure] [N/A:N/A] Procedural Pain: [1:0] [2:0] [N/A:N/A] Post Procedural Pain: [1:0] [2:0] [N/A:N/A] Debridement Treatment Procedure was tolerated [2:Procedure was tolerated] [N/A:N/A] Response: [1:well] [2:well] Post Debridement [1:1x0.6x0.5] [2:0.4x0.2x0.1] [N/A:N/A] Measurements L x W x D (cm) Post Debridement [1:0.236] [2:0.006] [N/A:N/A] Volume: (cm) Procedures Performed: Debridement [2:Debridement] [N/A:N/A] Treatment Notes Electronic Signature(s) Signed: 07/09/2019 6:02:32 PM By: Zandra Abts RN, BSN Signed: 07/10/2019 4:08:35 PM By: Baltazar Najjar MD Entered By: Baltazar Najjar on 07/09/2019 16:36:57 -------------------------------------------------------------------------------- Multi-Disciplinary Care Plan Details Patient Name: Date of Service: Allen Gregory, Allen Gregory 07/09/2019 2:45 PM Medical Record [id number] Patient Account Number: [id number] Date of Birth/Sex: Treating RN: 09-Nov-1960 (59 y.o. Male) Zandra Abts Primary Care Adon Gehlhausen: PATIENT, NO Other Clinician: Referring Porsha Skilton: Treating Siniyah Evangelist/Extender:Robson, [name] in Treatment: 0 Active Inactive Nutrition Nursing Diagnoses: Impaired glucose  control: actual or potential Potential for alteratiion in Nutrition/Potential for imbalanced nutrition Goals: Patient/caregiver agrees to and verbalizes understanding of need to use nutritional supplements and/or vitamins as prescribed Date Initiated: 07/09/2019 Target Resolution Date: 08/10/2019 Goal Status: Active Patient/caregiver will maintain therapeutic glucose  control Date Initiated: 07/09/2019 Target Resolution Date: 08/10/2019 Goal Status: Active Interventions: Assess HgA1c results as ordered upon admission and as needed Assess patient nutrition upon admission and as needed per policy Provide education on elevated blood sugars and impact on wound healing Provide education on nutrition Notes: Wound/Skin Impairment Nursing Diagnoses: Impaired tissue integrity Knowledge deficit related to ulceration/compromised skin integrity Goals: Patient/caregiver will verbalize understanding of skin care regimen Date Initiated: 07/09/2019 Target Resolution Date: 08/10/2019 Goal Status: Active Interventions: Assess patient/caregiver ability to obtain necessary supplies Assess patient/caregiver ability to perform ulcer/skin care regimen upon admission and as needed Assess ulceration(s) every visit Provide education on ulcer and skin care Notes: Electronic Signature(s) Signed: 07/09/2019 6:02:32 PM By: Levan Hurst RN, BSN Entered By: Levan Hurst on 07/09/2019 16:20:47 -------------------------------------------------------------------------------- Pain Assessment Details Patient Name: Date of Service: Allen Gregory, Allen Gregory 07/09/2019 2:45 PM Medical Record [id number] Patient Account Number: [id number] Date of Birth/Sex: Treating RN: [birth year] (58 y.o. Male) Baruch Gouty Primary Care Deuce Paternoster: PATIENT, NO Other Clinician: Referring Taniah Reinecke: Treating Kensley Lares/Extender:Robson, Esperanza Richters in Treatment: 0 Active Problems Location of Pain Severity and Description of Pain Patient Has Paino No Site Locations Rate the pain. Current Pain Level: 0 Pain Management and Medication Current Pain Management: Electronic Signature(s) Signed: 07/09/2019 5:35:37 PM By: Baruch Gouty RN, BSN Entered By: Baruch Gouty on 07/09/2019 15:18:02 -------------------------------------------------------------------------------- Patient/Caregiver Education  Details Patient Name: Date of Service: Allen Gregory, Allen Gregory 1/18/2021andnbsp2:45 PM Medical Record [id number] Patient Account Number: [id number] Date of Birth/Gender: Nov 21, 1960 (59 y.o. Male) Treating RN: Levan Hurst Primary Care Physician: PATIENT, NO Other Clinician: Referring Physician: Treating Physician/Extender:Robson, Esperanza Richters in Treatment: 0 Education Assessment Education Provided To: Patient Education Topics Provided Nutrition: Methods: Explain/Verbal Responses: State content correctly Wound/Skin Impairment: Methods: Explain/Verbal Responses: State content correctly Electronic Signature(s) Signed: 07/09/2019 6:02:32 PM By: Levan Hurst RN, BSN Entered By: Levan Hurst on 07/09/2019 16:23:23 -------------------------------------------------------------------------------- Wound Assessment Details Patient Name: Date of Service: Allen Gregory, Allen Gregory 07/09/2019 2:45 PM Medical Record [id number] Patient Account Number: [id number] Date of Birth/Sex: Treating RN: Jun 29, 1960 (58 y.o. Male) Baruch Gouty Primary Care Maryfer Tauzin: PATIENT, NO Other Clinician: Referring Arin Peral: Treating Wilmont Olund/Extender:Robson, Esperanza Richters in Treatment: 0 Wound Status Wound Number: 1 Primary Diabetic Wound/Ulcer of the Lower Etiology: Extremity Wound Location: Right Toe Great Wound Open Wounding Event: Gradually Appeared Status: Date Acquired: 01/20/2019 Comorbid Pneumothorax, Deep Vein Thrombosis, Type Weeks Of Treatment: 0 History: II Diabetes, Neuropathy Clustered Wound: No Photos Wound Measurements Length: (cm) 1 Width: (cm) 0.6 Depth: (cm) 0.5 Area: (cm) 0.471 Volume: (cm) 0.236 % Reduction in Area: 0% % Reduction in Volume: 0% Epithelialization: None Tunneling: No Undermining: Yes Starting Position (o'clock): 12 Ending Position (o'clock): 12 Maximum Distance: (cm) 0.4 Wound Description Classification: Grade 1 Foul Odor Wound Margin: Thickened  Slough/Fi Exudate Amount: Small Exudate Type: Serosanguineous Exudate Color: red, brown Wound Bed Granulation Amount: Large (67-100%) Granulation Quality: Red Fascia Ex Necrotic Amount: None Present (0%) Fat Layer Tendon Ex Muscle Ex Joint Exp Bone Expo After Cleansing: No brino No Exposed Structure posed: No (Subcutaneous Tissue) Exposed: Yes posed: No posed: No osed: No sed: No Electronic Signature(s) Signed: 07/13/2019 4:58:06 PM By: Baruch Gouty RN, BSN Signed: 07/13/2019 5:14:05 PM By: Mikeal Hawthorne EMT/HBOT Previous Signature: 07/09/2019 5:35:37 PM Version By: Baruch Gouty RN,  BSN Entered By: Benjaman Kindler on 07/13/2019 15:49:25 -------------------------------------------------------------------------------- Wound Assessment Details Patient Name: Date of Service: Allen Gregory, Allen Gregory 07/09/2019 2:45 PM Medical Record [id number] Patient Account Number: [id number] Date of Birth/Sex: Treating RN: 1961-06-02 (59 y.o. Male) Zenaida Deed Primary Care Ramiro Pangilinan: PATIENT, NO Other Clinician: Referring Alayasia Breeding: Treating Leontae Bostock/Extender:Robson, [name] in Treatment: 0 Wound Status Wound Number: 2 Primary Diabetic Wound/Ulcer of the Lower Etiology: Extremity Wound Location: Right Toe Fifth Wound Open Wounding Event: Trauma Status: Date Acquired: 06/25/2019 Comorbid Pneumothorax, Deep Vein Thrombosis, Type Weeks Of Treatment: 0 History: II Diabetes, Neuropathy Clustered Wound: No Photos Wound Measurements Length: (cm) 0.4 % Reductio Width: (cm) 0.2 % Reductio Depth: (cm) 0.1 Epithelial Area: (cm) 0.063 Tunneling Volume: (cm) 0.006 Undermini Wound Description Classification: Grade 1 Wound Margin: Flat and Intact Exudate Amount: None Present Wound Bed Granulation Amount: None Present (0%) Necrotic Amount: Small (1-33%) Necrotic Quality: Eschar Foul Odor After Cleansing: No Slough/Fibrino No Exposed Structure Fascia Exposed: No Fat Layer  (Subcutaneous Tissue) Exposed: No Tendon Exposed: No Muscle Exposed: No Joint Exposed: No Bone Exposed: No Limited to Skin Breakdown n in Area: 0% n in Volume: 0% ization: Small (1-33%) : No ng: No Electronic Signature(s) Signed: 07/13/2019 4:58:06 PM By: Zenaida Deed RN, BSN Signed: 07/13/2019 5:14:05 PM By: Benjaman Kindler EMT/HBOT Previous Signature: 07/09/2019 5:35:37 PM Version By: Zenaida Deed RN, BSN Entered By: Benjaman Kindler on 07/13/2019 15:49:45 -------------------------------------------------------------------------------- Vitals Details Patient Name: Date of Service: Allen Gregory, Allen Gregory 07/09/2019 2:45 PM Medical Record [id number] Patient Account Number: [id number] Date of Birth/Sex: Treating RN: 20-Oct-1960 (59 y.o. Male) Zenaida Deed Primary Care Jennalee Greaves: PATIENT, NO Other Clinician: Referring Ayo Guarino: Treating Leanny Moeckel/Extender:Robson, [name] in Treatment: 0 Vital Signs Time Taken: 15:00 Temperature (F): 98.6 Height (in): 78 Pulse (bpm): 79 Source: Stated Respiratory Rate (breaths/min): 18 Weight (lbs): 285 Blood Pressure (mmHg): 149/76 Source: Stated Reference Range: 80 - 120 mg / dl Body Mass Index (BMI): 32.9 Electronic Signature(s) Signed: 07/09/2019 5:35:37 PM By: Zenaida Deed RN, BSN Entered By: Zenaida Deed on 07/09/2019 15:19:43

## 2019-07-16 ENCOUNTER — Encounter (HOSPITAL_BASED_OUTPATIENT_CLINIC_OR_DEPARTMENT_OTHER): Payer: 59 | Admitting: Internal Medicine

## 2019-07-16 ENCOUNTER — Other Ambulatory Visit: Payer: Self-pay

## 2019-07-16 DIAGNOSIS — E11621 Type 2 diabetes mellitus with foot ulcer: Secondary | ICD-10-CM | POA: Diagnosis not present

## 2019-07-17 NOTE — Progress Notes (Signed)
TAIT, BALISTRERI ([id number]) Visit Report for 07/16/2019 HPI Details Patient Name: Date of Service: Allen Gregory, Allen Gregory 07/16/2019 2:15 PM Medical Record [id number] Patient Account Number: [id number] Date of Birth/Sex: Treating RN: 12-14-1960 (59 y.o. M) Primary Care Provider: PATIENT, NO Other Clinician: Referring Provider: Treating Provider/Extender:Alfreddie Consalvo, [name] in Treatment: 1 History of Present Illness HPI Description: ADMISSION 07/09/2019 This is a pleasant 59 year old man who referred himself here for second opinion sign a new area on the right plantar first toe and the dorsal part of his right fifth toe. He says he had these in August when he had removed callus from the first toe and then played pool volleyball for about 4 hours. He thinks the bottom of the pool simply caused an abrasion of the toe. The story sounds accurate. He has been going to podiatry for the last several months. Me a picture on his phone from August and the wound is gotten considerably smaller. He states that it started doing well recently when they started collagen. He has been using a surgical shoe to offload. 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 He had a brace for a long time but now he simply lifts his foot higher to clear but states that most people would not even notice that he had any disability. He is a type 2 diabetes Badik but he has diet-controlled he has lost weight by watching calories. 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 07/16/2019; right fifth toe is closed. Right first toe is smaller. He is using a surgical shoe but he tells me is fairly religious about using his scooter at home.  Hopefully this will give him enough offloading to close this. Electronic Signature(s) Signed: 07/17/2019 5:32:11 PM By: Baltazar Najjar MD Entered By: Baltazar Najjar on 07/16/2019 16:43:52 -------------------------------------------------------------------------------- Physical Exam Details Patient Name: Date of Service: JAQUELL, Gregory 07/16/2019 2:15 PM Medical Record [id number] Patient Account Number: [id number] Date of Birth/Sex: Treating RN: 06/27/1960 (59 y.o. M) Primary Care Provider: PATIENT, NO Other Clinician: Referring Provider: Treating Provider/Extender:Haunani Dickard, [name] in Treatment: 1 Constitutional Patient is hypertensive.. Pulse regular and within target range for patient.Marland Kitchen Respirations regular, non-labored and within target range.. Temperature is normal and within the target range for the patient.Marland Kitchen Appears in no distress. Notes Wound exam; the patient's wound on the right plantar fifth toe is closed. Over the interphalangeal joint of the right plantar first toe the wound is still open. Some debris around the wound with some callus but I elected not to debride this today. Dimensions are better surface of the wound looks satisfactory for now. May require debridement next week if this does not progress Electronic Signature(s) Signed: 07/17/2019 5:32:11 PM By: Baltazar Najjar MD Entered By: Baltazar Najjar on 07/16/2019 16:44:48 -------------------------------------------------------------------------------- Physician Orders Details Patient Name: Date of Service: DEARL, RUDDEN 07/16/2019 2:15 PM Medical Record [id number] Patient Account Number: [id number] Date of Birth/Sex: Treating RN: [birth year] (59 y.o. 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 Dressing Change Frequency Wound #1 Right Toe Great Change Dressing every other day. - may change daily if needed for excess drainage Wound Cleansing Wound #1 Right Toe Great May shower and wash wound with soap and water. - on days that dressing is changed Primary Wound Dressing Wound #1 Right Toe Great Silver Collagen - moisten with hydrogel (or KY jelly) Secondary Dressing Wound #1 Right Toe Great Foam - foam donut Kerlix/Rolled Gauze Dry Gauze Other: - felt in surgical shoe Electronic Signature(s) Signed: 07/16/2019 6:46:58 PM By: Levan Hurst RN, BSN Signed: 07/17/2019 5:32:11 PM By: Linton Ham MD Entered By: Levan Hurst on 07/16/2019 16:29:25 -------------------------------------------------------------------------------- Problem List Details Patient Name: Date of Service: Allen, Gregory 07/16/2019 2:15 PM Medical Record [id number] Patient Account Number: [id number] Date of Birth/Sex: Treating RN: 1960-12-08 (59 y.o. [name] Primary Care Provider: PATIENT, NO Other Clinician: Referring Provider: Treating Provider/Extender:Anaija Wissink, Esperanza Richters in Treatment: 1 Active Problems ICD-10 Evaluated Encounter Code Description Active Date Today Diagnosis E11.621 Type 2 diabetes mellitus with foot ulcer 07/09/2019 No Yes L97.512 Non-pressure chronic ulcer of other part of right foot 07/09/2019 No Yes with fat layer exposed E11.42 Type 2 diabetes mellitus with diabetic polyneuropathy 07/09/2019 No Yes M21.371 Foot drop, right foot 07/09/2019 No Yes Inactive Problems Resolved Problems Electronic Signature(s) Signed: 07/17/2019 5:32:11 PM By: Linton Ham MD Entered By: Linton Ham on  07/16/2019 16:43:01 -------------------------------------------------------------------------------- Progress Note Details Patient Name: Date of Service: Allen, Gregory 07/16/2019 2:15 PM Medical Record [id number] Patient Account Number: [id number] Date of Birth/Sex: Treating RN: 04-06-1961 (59 y.o. M) Primary Care Provider: PATIENT, NO Other Clinician: Referring Provider: Treating Provider/Extender:Izeah Vossler, Esperanza Richters in Treatment: 1 Subjective History of Present Illness (HPI) ADMISSION 07/09/2019 This is a pleasant 59 year old man who referred himself here for second opinion sign a new area on the right plantar first toe and the dorsal part of his right fifth toe. He says he had these in August when he had removed callus from the first toe and then played pool volleyball for about 4 hours. He thinks the bottom of the pool simply caused an abrasion of the toe. The story sounds accurate. He has been going to podiatry for the last several months. Me a picture on his phone from August and the wound is gotten considerably smaller. He states that it started doing well recently when they started collagen. He has been using a surgical shoe to offload. 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 He had a brace for a long time but now he simply lifts his foot higher to clear but states that most people would not even notice that he had any disability. He is a type 2 diabetes Badik but he has diet-controlled he has lost weight by watching calories. 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 07/16/2019; right fifth toe is closed. Right first toe is smaller. He is using a surgical shoe but he tells me  is fairly religious about using his scooter at home. Hopefully this will give him enough offloading to close this. Objective Constitutional Patient is hypertensive.. Pulse regular and  within target range for patient.Marland Kitchen Respirations regular, non-labored and within target range.. Temperature is normal and within the target range for the patient.Marland Kitchen Appears in no distress. Vitals Time Taken: 3:18 PM, Height: 78 in, Weight: 285 lbs, BMI: 32.9, Temperature: 98 F, Pulse: 71 bpm, Respiratory Rate: 18 breaths/min, Blood Pressure: 140/76 mmHg. General Notes: Wound exam; the patient's wound on the right plantar fifth toe is closed. ooOver the interphalangeal joint of the right plantar first toe the wound is still open. Some debris around the wound with some callus but I elected not to debride this today. Dimensions are better surface of the wound looks satisfactory for now. May require debridement next week if this does not progress Integumentary (Hair, Skin) Wound #1 status is Open. Original cause of wound was Gradually Appeared. The wound is located on the Right Toe Great. The wound measures 1cm length x 0.4cm width x 0.2cm depth; 0.314cm^2 area and 0.063cm^3 volume. There is Fat Layer (Subcutaneous Tissue) Exposed exposed. There is no tunneling or undermining noted. There is a small amount of serosanguineous drainage noted. The wound margin is thickened. There is large (67-100%) red granulation within the wound bed. There is no necrotic tissue within the wound bed. Wound #2 status is Healed - Epithelialized. Original cause of wound was Trauma. The wound is located on the Right Toe Fifth. The wound measures 0cm length x 0cm width x 0cm depth; 0cm^2 area and 0cm^3 volume. The wound is limited to skin breakdown. There is no tunneling or undermining noted. There is a none present amount of drainage noted. The wound margin is flat and intact. There is no granulation within the wound bed. There is  no necrotic tissue within the wound bed. 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 Dressing Change Frequency: Wound #1 Right Toe Great: Change Dressing every other day. - may change daily if needed for excess drainage Wound Cleansing: Wound #1 Right Toe Great: May shower and wash wound with soap and water. - on days that dressing is changed Primary Wound Dressing: Wound #1 Right Toe Great: Silver Collagen - moisten with hydrogel (or KY jelly) Secondary Dressing: Wound #1 Right Toe Great: Foam - foam donut Kerlix/Rolled Gauze Dry Gauze Other: - felt in surgical shoe 1. Continue with silver collagen to the right great toe, Kerlix gauze still in the surgical shoe. He is offloading with a scooter. I told him the option would be a total contact cast 2. He reminds me that this happened playing swimming pool volleyball. It may be that he can go back in his regular shoes without worry once this closes Electronic Signature(s) Signed: 07/17/2019 5:32:11 PM By: Baltazar Najjar MD Entered By: Baltazar Najjar on 07/16/2019 16:45:55 -------------------------------------------------------------------------------- SuperBill Details Patient Name: Date of Service: JOSEDE, CICERO 07/16/2019 Medical Record [id number] Patient Account Number: [id number] Date of Birth/Sex: Treating RN: 01-May-1961 (59 y.o. M) Primary Care Provider: PATIENT, NO Other Clinician: Referring Provider: Treating Provider/Extender:Elizzie Westergard, [name] in Treatment: 1 Diagnosis Coding ICD-10 Codes Code Description E11.621 Type 2 diabetes mellitus with foot ulcer L97.512 Non-pressure chronic ulcer of other part of right foot with fat layer exposed E11.42 Type 2 diabetes  mellitus with diabetic polyneuropathy M21.371 Foot drop, right foot Facility Procedures CPT4 Code: [id number] Description: 99213 - WOUND CARE VISIT-LEV 3 EST PT Modifier:  Quantity: 1 Physician Procedures CPT4 Code Description: [id number] 99213 - WC PHYS LEVEL 3 - EST PT ICD-10 Diagnosis Description L97.512 Non-pressure chronic ulcer of other part of right foot wit E11.621 Type 2 diabetes mellitus with foot ulcer Modifier: h fat layer e Quantity: 1 xposed Electronic Signature(s) Signed: 07/16/2019 6:46:58 PM By: Zandra Abts RN, BSN Signed: 07/17/2019 5:32:11 PM By: Baltazar Najjar MD Entered By: Zandra Abts on 07/16/2019 18:45:16

## 2019-07-17 NOTE — Progress Notes (Addendum)
Allen Gregory ([id number]) Visit Report for 07/16/2019 Arrival Information Details Patient Name: Date of Service: [address] [place], [place] 07/16/2019 2:15 PM Medical Record Number: [id number] Patient Account Number: [id number] Date of Birth/Sex: Treating RN: July 25, 1960 (59 y.o. Allen Gregory) Yevonne Pax Primary Care Carmelle Bamberg: [name], [place] Other Clinician: Referring Jaxston Chohan: Treating Pansey Pinheiro/Extender: Valentino Saxon in Treatment: 1 Visit Information History Since Last Visit All ordered tests and consults were completed: No Patient Arrived: Knee Scooter Added or deleted any medications: No Arrival Time: 15:18 Any new allergies or adverse reactions: No Accompanied By: self Had a fall or experienced change in No Transfer Assistance: None activities of daily living that may affect Patient Identification Verified: Yes risk of falls: Secondary Verification Process Completed: Yes Signs or symptoms of abuse/neglect since last visito No Patient Requires Transmission-Based Precautions: No Hospitalized since last visit: No Patient Has Alerts: Yes Implantable device outside of the clinic excluding No Patient Alerts: Patient on Blood Thinner cellular tissue based products placed in the center since last visit: Has Dressing in Place as Prescribed: Yes Pain Present Now: No Electronic Signature(s) Signed: 07/16/2019 6:01:30 PM By: Yevonne Pax RN Entered By: Yevonne Pax on 07/16/2019 15:18:35 -------------------------------------------------------------------------------- Clinic Level of Care Assessment Details Patient Name: Date of Service: [place], [place] 07/16/2019 2:15 PM Medical Record Number: [id number] Patient Account Number: [id number] Date of Birth/Sex: Treating RN: October 16, 1960 (59 y.o. Allen Gregory Primary Care Conley Pawling: [name], NO Other Clinician: Referring Cj Edgell: Treating Sadie Pickar/Extender: Valentino Saxon in Treatment: 1 Clinic Level of Care Assessment Items TOOL 4  Quantity Score X- 1 0 Use when only an EandM is performed on FOLLOW-UP visit ASSESSMENTS - Nursing Assessment / Reassessment X- 1 10 Reassessment of Co-morbidities (includes updates in patient status) X- 1 5 Reassessment of Adherence to Treatment Plan ASSESSMENTS - Wound and Skin A ssessment / Reassessment X - Simple Wound Assessment / Reassessment - one wound 1 5 []  - 0 Complex Wound Assessment / Reassessment - multiple wounds []  - 0 Dermatologic / Skin Assessment (not related to wound area) ASSESSMENTS - Focused Assessment []  - 0 Circumferential Edema Measurements - multi extremities []  - 0 Nutritional Assessment / Counseling / Intervention X- 1 5 Lower Extremity Assessment (monofilament, tuning fork, pulses) []  - 0 Peripheral Arterial Disease Assessment (using hand held doppler) ASSESSMENTS - Ostomy and/or Continence Assessment and Care []  - 0 Incontinence Assessment and Management []  - 0 Ostomy Care Assessment and Management (repouching, etc.) PROCESS - Coordination of Care X - Simple Patient / Family Education for ongoing care 1 15 []  - 0 Complex (extensive) Patient / Family Education for ongoing care X- 1 10 Staff obtains , Records, T Results / Process Orders est []  - 0 Staff telephones HHA, Nursing Homes / Clarify orders / etc []  - 0 Routine Transfer to another Facility (non-emergent condition) []  - 0 Routine Hospital Admission (non-emergent condition) []  - 0 New Admissions / / Ordering NPWT Apligraf, etc. , []  - 0 Emergency Hospital Admission (emergent condition) X- 1 10 Simple Discharge Coordination []  - 0 Complex (extensive) Discharge Coordination PROCESS - Special Needs []  - 0 Pediatric / Minor Patient Management []  - 0 Isolation Patient Management []  - 0 Hearing / Language / Visual special needs []  - 0 Assessment of Community assistance (transportation, D/C planning, etc.) []  - 0 Additional assistance / Altered  mentation []  - 0 Support Surface(s) Assessment (bed, cushion, seat, etc.) INTERVENTIONS - Wound Cleansing / Measurement X - Simple Wound  Cleansing - one wound 1 5 []  - 0 Complex Wound Cleansing - multiple wounds X- 1 5 Wound Imaging (photographs - any number of wounds) []  - 0 Wound Tracing (instead of photographs) X- 1 5 Simple Wound Measurement - one wound []  - 0 Complex Wound Measurement - multiple wounds INTERVENTIONS - Wound Dressings X - Small Wound Dressing one or multiple wounds 1 10 []  - 0 Medium Wound Dressing one or multiple wounds []  - 0 Large Wound Dressing one or multiple wounds []  - 0 Application of Medications - topical []  - 0 Application of Medications - injection INTERVENTIONS - Miscellaneous []  - 0 External ear exam []  - 0 Specimen Collection (cultures, biopsies, blood, body fluids, etc.) []  - 0 Specimen(s) / Culture(s) sent or taken to Lab for analysis []  - 0 Patient Transfer (multiple staff / Civil Service fast streamer / Similar devices) []  - 0 Simple Staple / Suture removal (25 or less) []  - 0 Complex Staple / Suture removal (26 or more) []  - 0 Hypo / Hyperglycemic Management (close monitor of Blood Glucose) []  - 0 Ankle / Brachial Index (ABI) - do not check if billed separately X- 1 5 Vital Signs Has the patient been seen at the hospital within the last three years: Yes Total Score: 90 Level Of Care: New/Established - Level 3 Electronic Signature(s) Signed: 07/16/2019 6:46:58 PM By: Levan Hurst RN, BSN Entered By: Levan Hurst on 07/16/2019 18:45:07 -------------------------------------------------------------------------------- Encounter Discharge Information Details Patient Name: Date of Service: [address], [place] 07/16/2019 2:15 PM Medical Record Number: [id number] Patient Account Number: [id number] Date of Birth/Sex: Treating RN: May 29, 1961 (59 y.o. Allen Gregory Primary Care Adanna Zuckerman: PA Haig Prophet, NO Other Clinician: Referring Sherilynn Dieu: Treating  Timithy Arons/Extender: Cheree Ditto in Treatment: 1 Encounter Discharge Information Items Discharge Condition: Stable Ambulatory Status: Walker Discharge Destination: Home Transportation: Private Auto Accompanied By: self Schedule Follow-up Appointment: Yes Clinical Summary of Care: Electronic Signature(s) Signed: 07/16/2019 5:49:19 PM By: Deon Pilling Entered By: Deon Pilling on 07/16/2019 16:31:57 -------------------------------------------------------------------------------- Lower Extremity Assessment Details Patient Name: Date of Service: [place] [address], [place] 07/16/2019 2:15 PM Medical Record Number: [id number] Patient Account Number: [id number] Date of Birth/Sex: Treating RN: [birth year] (58 y.o. [name]) Carlene Coria Primary Care Salif Tay: PA Haig Prophet, NO Other Clinician: Referring Johnnisha Forton: Treating Rosealee Recinos/Extender: Cheree Ditto in Treatment: 1 Edema Assessment Assessed: [Left: No] [Right: No] Edema: [Left: N] [Right: o] Calf Left: Right: Point of Measurement: 38 cm From Medial Instep cm 45 cm Ankle Left: Right: Point of Measurement: 10 cm From Medial Instep cm 25 cm Electronic Signature(s) Signed: 07/16/2019 6:01:30 PM By: Carlene Coria RN Entered By: Carlene Coria on 07/16/2019 15:19:51 -------------------------------------------------------------------------------- Multi Wound Chart Details Patient Name: Date of Service: [address], [place] 07/16/2019 2:15 PM Medical Record Number: [id number] Patient Account Number: [id number] Date of Birth/Sex: Treating RN: [birth year] (59 y.o. M) Primary Care Tavaris Eudy: PA Haig Prophet, NO Other Clinician: Referring Tansy Lorek: Treating Charle Mclaurin/Extender: Cheree Ditto in Treatment: 1 Vital Signs Height(in): 66 Pulse(bpm): 89 Weight(lbs): 285 Blood Pressure(mmHg): 140/76 Body Mass Index(BMI): 33 Temperature(F): 50 Respiratory Rate(breaths/min): 53 Photos: [1:No Photos Right T Great oe] [2:No Photos Right T Fifth oe]  [N/A:N/A N/A] Wound Location: [1:Gradually Appeared] [2:Trauma] [N/A:N/A] Wounding Event: [1:Diabetic Wound/Ulcer of the Lower] [2:Diabetic Wound/Ulcer of the Lower] [N/A:N/A] Primary Etiology: [1:Extremity Pneumothorax, Deep Vein Thrombosis, Pneumothorax, Deep Vein Thrombosis, N/A] [2:Extremity] Comorbid History: [1:Type II Diabetes, Neuropathy 01/20/2019] [2:Type II Diabetes, Neuropathy 06/25/2019] [N/A:N/A] Date Acquired: [1:1] [2:1] [N/A:N/A] Weeks of  Treatment: [1:Open] [2:Healed - Epithelialized] [N/A:N/A] Wound Status: [1:1x0.4x0.2] [2:0x0x0] [N/A:N/A] Measurements L x W x D (cm) [1:0.314] [2:0] [N/A:N/A] A (cm) : rea [1:0.063] [2:0] [N/A:N/A] Volume (cm) : [1:33.30%] [2:100.00%] [N/A:N/A] % Reduction in A rea: [1:73.30%] [2:100.00%] [N/A:N/A] % Reduction in Volume: [1:Grade 1] [2:Grade 1] [N/A:N/A] Classification: [1:Small] [2:None Present] [N/A:N/A] Exudate A mount: [1:Serosanguineous] [2:N/A] [N/A:N/A] Exudate Type: [1:red, brown] [2:N/A] [N/A:N/A] Exudate Color: [1:Thickened] [2:Flat and Intact] [N/A:N/A] Wound Margin: [1:Large (67-100%)] [2:None Present (0%)] [N/A:N/A] Granulation A mount: [1:Red] [2:N/A] [N/A:N/A] Granulation Quality: [1:None Present (0%)] [2:None Present (0%)] [N/A:N/A] Necrotic A mount: [1:Fat Layer (Subcutaneous Tissue)] [2:Fascia: No] [N/A:N/A] Exposed Structures: [1:Exposed: Yes Fascia: No Tendon: No Muscle: No Joint: No Bone: No None] [2:Fat Layer (Subcutaneous Tissue) Exposed: No Tendon: No Muscle: No Joint: No Bone: No Limited to Skin Breakdown Large (67-100%)] [N/A:N/A] Treatment Notes Wound #1 (Right Toe Great) 1. Cleanse With Wound Cleanser 3. Primary Dressing Applied Collegen AG Hydrogel or K-Y Jelly 4. Secondary Dressing Dry Gauze Roll Gauze Foam 5. Secured With Medipore tape 7. Footwear/Offloading device applied Felt/Foam Surgical shoe Electronic Signature(s) Signed: 07/17/2019 5:32:11 PM By: Baltazar Najjar MD Entered By: Baltazar Najjar on 07/16/2019 16:43:17 -------------------------------------------------------------------------------- Multi-Disciplinary Care Plan Details Patient Name: Date of Service: [address] RD, [place] 07/16/2019 2:15 PM Medical Record Number: [id number] Patient Account Number: [id number] Date of Birth/Sex: Treating RN: 12-Apr-1961 (59 y.o. Allen Gregory Primary Care Anastasia Tompson: [name], [place] Other Clinician: Referring Damonique Brunelle: Treating Asante Blanda/Extender: Valentino Saxon in Treatment: 1 Active Inactive Nutrition Nursing Diagnoses: Impaired glucose control: actual or potential Potential for alteratiion in Nutrition/Potential for imbalanced nutrition Goals: Patient/caregiver agrees to and verbalizes understanding of need to use nutritional supplements and/or vitamins as prescribed Date Initiated: 07/09/2019 Target Resolution Date: 08/10/2019 Goal Status: Active Patient/caregiver will maintain therapeutic glucose control Date Initiated: 07/09/2019 Target Resolution Date: 08/10/2019 Goal Status: Active Interventions: Assess HgA1c results as ordered upon admission and as needed Assess patient nutrition upon admission and as needed per policy Provide education on elevated blood sugars and impact on wound healing Provide education on nutrition Treatment Activities: Education provided on Nutrition : 07/09/2019 Notes: Wound/Skin Impairment Nursing Diagnoses: Impaired tissue integrity Knowledge deficit related to ulceration/compromised skin integrity Goals: Patient/caregiver will verbalize understanding of skin care regimen Date Initiated: 07/09/2019 Target Resolution Date: 08/10/2019 Goal Status: Active Interventions: Assess patient/caregiver ability to obtain necessary supplies Assess patient/caregiver ability to perform ulcer/skin care regimen upon admission and as needed Assess ulceration(s) every visit Provide education on ulcer and skin care Notes: Electronic  Signature(s) Signed: 07/16/2019 6:46:58 PM By: Zandra Abts RN, BSN Entered By: Zandra Abts on 07/16/2019 18:44:40 -------------------------------------------------------------------------------- Pain Assessment Details Patient Name: Date of Service: Leone Payor RD, RO SO L 07/16/2019 2:15 PM Medical Record Number: [id number] Patient Account Number: [id number] Date of Birth/Sex: Treating RN: Jun 13, 1961 (58 y.o. Allen Gregory) Yevonne Pax Primary Care Yari Szeliga: [name], [place] Other Clinician: Referring Brecklyn Galvis: Treating Zyria Fiscus/Extender: Valentino Saxon in Treatment: 1 Active Problems Location of Pain Severity and Description of Pain Patient Has Paino No Site Locations Pain Management and Medication Current Pain Management: Electronic Signature(s) Signed: 07/16/2019 6:01:30 PM By: Yevonne Pax RN Entered By: Yevonne Pax on 07/16/2019 15:19:11 -------------------------------------------------------------------------------- Patient/Caregiver Education Details Patient Name: Date of Service: Leone Payor RD, RO SO L 1/25/2021andnbsp2:15 PM Medical Record Number: [id number] Patient Account Number: [id number] Date of Birth/Gender: Treating RN: 1961-04-11 (59 y.o. Allen Gregory Primary Care Physician: PA Fidela Juneau Other Clinician: Referring Physician: Treating Physician/Extender: Valentino Saxon in Treatment:  1 Education Assessment Education Provided To: Patient Education Topics Provided Wound/Skin Impairment: Methods: Explain/Verbal Responses: State content correctly Electronic Signature(s) Signed: 07/16/2019 6:46:58 PM By: Zandra Abts RN, BSN Entered By: Zandra Abts on 07/16/2019 18:44:49 -------------------------------------------------------------------------------- Wound Assessment Details Patient Name: Date of Service: Allen Gregory NA RD 07/16/2019 2:15 PM Medical Record Number: [id number] Patient Account Number: [id number] Date of Birth/Sex: Treating RN: 01-02-1961  (58 y.o. Allen Gregory) Yevonne Pax Primary Care Shealeigh Dunstan: [name], [place] Other Clinician: Referring Nalee Lightle: Treating Bethanne Mule/Extender: Valentino Saxon in Treatment: 1 Wound Status Wound Number: 1 Primary Diabetic Wound/Ulcer of the Lower Extremity Etiology: Wound Location: Right T Great oe Wound Status: Open Wounding Event: Gradually Appeared Comorbid Pneumothorax, Deep Vein Thrombosis, Type II Diabetes, Date Acquired: 01/20/2019 History: Neuropathy Weeks Of Treatment: 1 Clustered Wound: No Photos Wound Measurements Length: (cm) 1 Width: (cm) 0.4 Depth: (cm) 0.2 Area: (cm) 0.314 Volume: (cm) 0.063 % Reduction in Area: 33.3% % Reduction in Volume: 73.3% Epithelialization: None Tunneling: No Undermining: No Wound Description Classification: Grade 1 Wound Margin: Thickened Exudate Amount: Small Exudate Type: Serosanguineous Exudate Color: red, brown Foul Odor After Cleansing: No Slough/Fibrino No Wound Bed Granulation Amount: Large (67-100%) Exposed Structure Granulation Quality: Red Fascia Exposed: No Necrotic Amount: None Present (0%) Fat Layer (Subcutaneous Tissue) Exposed: Yes Tendon Exposed: No Muscle Exposed: No Joint Exposed: No Bone Exposed: No Electronic Signature(s) Signed: 07/24/2019 5:19:10 PM By: Benjaman Kindler EMT/HBOT Signed: 12/18/2019 5:27:17 PM By: Yevonne Pax RN Previous Signature: 07/16/2019 6:01:30 PM Version By: Yevonne Pax RN Entered By: Benjaman Kindler on 07/24/2019 15:05:35 -------------------------------------------------------------------------------- Wound Assessment Details Patient Name: Date of Service: Allen Gregory NA RD 07/16/2019 2:15 PM Medical Record Number: [id number] Patient Account Number: [id number] Date of Birth/Sex: Treating RN: [birth year] (59 y.o. M) Primary Care Georgenia Salim: [name], NO Other Clinician: Referring Naziyah Tieszen: Treating Latifah Padin/Extender: Valentino Saxon in Treatment: 1 Wound Status Wound Number: 2  Primary Diabetic Wound/Ulcer of the Lower Extremity Etiology: Wound Location: Right T Fifth oe Wound Status: Healed - Epithelialized Wounding Event: Trauma Comorbid Pneumothorax, Deep Vein Thrombosis, Type II Diabetes, Date Acquired: 06/25/2019 History: Neuropathy Weeks Of Treatment: 1 Clustered Wound: No Photos Wound Measurements Length: (cm) Width: (cm) Depth: (cm) Area: (cm) Volume: (cm) 0 % Reduction in Area: 100% 0 % Reduction in Volume: 100% 0 Epithelialization: Large (67-100%) 0 Tunneling: No 0 Undermining: No Wound Description Classification: Grade 1 Wound Margin: Flat and Intact Exudate Amount: None Present Foul Odor After Cleansing: No Slough/Fibrino No Wound Bed Granulation Amount: None Present (0%) Exposed Structure Necrotic Amount: None Present (0%) Fascia Exposed: No Fat Layer (Subcutaneous Tissue) Exposed: No Tendon Exposed: No Muscle Exposed: No Joint Exposed: No Bone Exposed: No Limited to Skin Breakdown Electronic Signature(s) Signed: 07/24/2019 5:19:10 PM By: Benjaman Kindler EMT/HBOT Previous Signature: 07/16/2019 6:46:58 PM Version By: Zandra Abts RN, BSN Entered By: Benjaman Kindler on 07/24/2019 15:05:14 -------------------------------------------------------------------------------- Vitals Details Patient Name: Date of Service: Leone Payor RD, RO SO L 07/16/2019 2:15 PM Medical Record Number: [id number] Patient Account Number: [id number] Date of Birth/Sex: Treating RN: [birth year] (58 y.o. Allen Gregory) Yevonne Pax Primary Care Cristofer Yaffe: [name], [place] Other Clinician: Referring Timmy Cleverly: Treating Pranit Owensby/Extender: Valentino Saxon in Treatment: 1 Vital Signs Time Taken: 15:18 Temperature (F): 98 Height (in): 78 Pulse (bpm): 71 Weight (lbs): 285 Respiratory Rate (breaths/min): 18 Body Mass Index (BMI): 32.9 Blood Pressure (mmHg): 140/76 Reference Range: 80 - 120 mg / dl Electronic Signature(s) Signed: 07/16/2019 6:01:30 PM By: Yevonne Pax  RN Entered By: Yevonne Pax  on 07/16/2019 15:19:04

## 2019-07-23 ENCOUNTER — Other Ambulatory Visit: Payer: Self-pay

## 2019-07-23 ENCOUNTER — Encounter (HOSPITAL_BASED_OUTPATIENT_CLINIC_OR_DEPARTMENT_OTHER): Payer: 59 | Admitting: Internal Medicine

## 2019-07-23 DIAGNOSIS — M21371 Foot drop, right foot: Secondary | ICD-10-CM | POA: Insufficient documentation

## 2019-07-23 DIAGNOSIS — Z86711 Personal history of pulmonary embolism: Secondary | ICD-10-CM | POA: Insufficient documentation

## 2019-07-23 DIAGNOSIS — L97512 Non-pressure chronic ulcer of other part of right foot with fat layer exposed: Secondary | ICD-10-CM | POA: Diagnosis not present

## 2019-07-23 DIAGNOSIS — E1142 Type 2 diabetes mellitus with diabetic polyneuropathy: Secondary | ICD-10-CM | POA: Diagnosis not present

## 2019-07-23 DIAGNOSIS — E11621 Type 2 diabetes mellitus with foot ulcer: Secondary | ICD-10-CM | POA: Diagnosis not present

## 2019-07-23 DIAGNOSIS — E1151 Type 2 diabetes mellitus with diabetic peripheral angiopathy without gangrene: Secondary | ICD-10-CM | POA: Diagnosis not present

## 2019-07-23 DIAGNOSIS — I10 Essential (primary) hypertension: Secondary | ICD-10-CM | POA: Insufficient documentation

## 2019-07-23 DIAGNOSIS — Z7901 Long term (current) use of anticoagulants: Secondary | ICD-10-CM | POA: Insufficient documentation

## 2019-07-23 DIAGNOSIS — Z96641 Presence of right artificial hip joint: Secondary | ICD-10-CM | POA: Insufficient documentation

## 2019-07-23 NOTE — Progress Notes (Signed)
Allen Gregory ([id number]) Visit Report for 07/23/2019 Debridement Details Patient Name: Date of Service: Allen Gregory, Allen Gregory 07/23/2019 8:00 AM Medical Record [id number] Patient Account Number: [id number] Date of Birth/Sex: Treating RN: July 11, 1960 (59 y.o. M) Primary Care Provider: PATIENT, NO Other Clinician: Referring Provider: Treating Provider/Extender:Allen Gregory, Allen Gregory in Treatment: 2 Debridement Performed for Wound #1 Right Toe Great Assessment: Performed By: Physician Allen Caul., MD Debridement Type: Debridement Severity of Tissue Pre Fat layer exposed Debridement: Level of Consciousness (Pre- Awake and Alert procedure): Pre-procedure Verification/Time Out Taken: Yes - 09:05 Start Time: 09:05 Total Area Debrided (L x W): 0.9 (cm) x 0.4 (cm) = 0.36 (cm) Tissue and other material Viable, Non-Viable, Callus, Subcutaneous debrided: Level: Skin/Subcutaneous Tissue Debridement Description: Excisional Instrument: Curette Bleeding: Moderate Hemostasis Achieved: Silver Nitrate End Time: 09:06 Procedural Pain: 0 Post Procedural Pain: 0 Response to Treatment: Procedure was tolerated well Level of Consciousness Awake and Alert (Post-procedure): Post Debridement Measurements of Total Wound Length: (cm) 0.9 Width: (cm) 0.4 Depth: (cm) 0.4 Volume: (cm) 0.113 Character of Wound/Ulcer Post Improved Debridement: Severity of Tissue Post Debridement: Fat layer exposed Post Procedure Diagnosis Same as Pre-procedure Electronic Signature(s) Signed: 07/23/2019 6:41:12 PM By: Allen Najjar MD Entered By: Allen Gregory on 07/23/2019 09:28:49 -------------------------------------------------------------------------------- HPI Details Patient Name: Date of Service: Allen Gregory, Allen Gregory 07/23/2019 8:00 AM Medical Record [id number] Patient Account Number: [id number] Date of Birth/Sex: Treating RN: 08/29/1960 (59 y.o. M) Primary Care Provider: PATIENT, NO Other  Clinician: Referring Provider: Treating Provider/Extender:Allen Gregory, Allen Gregory in Treatment: 2 History of Present Illness HPI Description: ADMISSION 07/09/2019 This is a pleasant 59 year old man who referred himself here for second opinion sign a new area on the right plantar first toe and the dorsal part of his right fifth toe. He says he had these in August when he had removed callus from the first toe and then played pool volleyball for about 4 hours. He thinks the bottom of the pool simply caused an abrasion of the toe. The story sounds accurate. He has been going to podiatry for the last several months. Me a picture on his phone from August and the wound is gotten considerably smaller. He states that it started doing well recently when they started collagen. He has been using a surgical shoe to offload. 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 He had a brace for a long time but now he simply lifts his foot higher to clear but states that most people would not even notice that he had any disability. He is a type 2 diabetes Badik but he has diet-controlled he has lost weight by watching calories. 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 07/16/2019; right fifth toe is closed. Right first toe is smaller. He is using a surgical shoe but he tells me is fairly religious about using his scooter at home. Hopefully this will give him enough offloading to close this. 2/1; right fifth toe dorsally remains closed. Right first toe still not a lot of improvement. Although superficially it looks smaller there is undermining laterally from 6-6 of at least 3 to 4 mm. Also worrisome is that the granulation does  not look that healthy. We have been using silver collagen He had his  foot x-rayed by his podiatrist but I do not have access to this. I am going to x-ray the right great toe again. Otherwise I am thinking he is going to require a total contact cast probably next week and I discussed this with him today. Electronic Signature(s) Signed: 07/23/2019 6:41:12 PM By: Allen Najjar MD Entered By: Allen Gregory on 07/23/2019 09:30:57 -------------------------------------------------------------------------------- Physical Exam Details Patient Name: Date of Service: Allen Gregory, Allen Gregory 07/23/2019 8:00 AM Medical Record [id number] Patient Account Number: [id number] Date of Birth/Sex: Treating RN: January 05, 1961 (59 y.o. M) Primary Care Provider: PATIENT, NO Other Clinician: Referring Provider: Treating Provider/Extender:Allen Gregory, Allen Gregory in Treatment: 2 Constitutional Sitting or standing Blood Pressure is within target range for patient.. Pulse regular and within target range for patient.Marland Kitchen Respirations regular, non-labored and within target range.. Temperature is normal and within the target range for the patient.Marland Kitchen Appears in no distress. Notes Wound exam; the patient's wound is on the plantar right great toe. Over the interphalangeal joint of the right great toe 3 to 4 mm of 6-6 lateral undermining. There is no palpable bone. I am not healthy with healthy granulation looks. There was no overt evidence of infection using a #5 curette I removed the entire circumference of nonviable tissue. Hemostasis with silver nitrate and direct pressure Electronic Signature(s) Signed: 07/23/2019 6:41:12 PM By: Allen Najjar MD Entered By: Allen Gregory on 07/23/2019 09:31:56 -------------------------------------------------------------------------------- Physician Orders Details Patient Name: Date of Service: Allen Gregory, Allen Gregory 07/23/2019 8:00 AM Medical Record [id number] Patient Account Number:  [id number] Date of Birth/Sex: Treating RN: 09/29/1960 (59 y.o. 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 Dressing Change Frequency Wound #1 Right Toe Great Change Dressing every other day. - may change daily if needed for excess drainage Wound Cleansing Wound #1 Right Toe Great May shower and wash wound with soap and water. - on days that dressing is changed Primary Wound Dressing Wound #1 Right Toe Great Calcium Alginate with Silver Secondary Dressing Wound #1 Right Toe Great Foam - foam donut Kerlix/Rolled Gauze Dry Gauze Other: - felt in surgical shoe Radiology X-ray, right great toe - Non healing ulcer on plantar great toe - (ICD10 E11.621 - Type 2 diabetes mellitus with foot ulcer) Electronic Signature(s) Signed: 07/23/2019 6:39:44 PM By: Zandra Abts RN, BSN Signed: 07/23/2019 6:41:12 PM By: Allen Najjar MD Entered By: Zandra Abts on 07/23/2019 09:09:05 -------------------------------------------------------------------------------- Prescription 07/23/2019 Patient Name: Allen Gregory Provider: Baltazar Najjar MD Date of Birth: Feb 19, 1961 NPI#: [id number] Sex: M DEA#: [id number] Phone #: [phone] License #: [id number] Patient Address: Eligha Bridegroom [facility] [address] [place], [place] [address] [address] [place], [place] [address] [phone] Allergies lisinopril Reaction: cough Provider's Orders X-ray, right great toe - ICD10: E11.621 - Non healing ulcer  on plantar great toe Signature(s): Date(s): Electronic Signature(s) Signed: 07/23/2019 6:39:44 PM By: Zandra Abts RN, BSN Signed: 07/23/2019 6:41:12 PM By: Allen Najjar MD Entered By: Zandra Abts on 07/23/2019 09:09:06 --------------------------------------------------------------------------------  Problem List Details Patient Name: Date of Service: Allen Gregory, Allen Gregory 07/23/2019 8:00 AM Medical Record [id number] Patient Account Number: [id number] Date of Birth/Sex: Treating RN: [birth year] (58  y.o. Judie PetitM) Zandra AbtsLynch, Shatara Primary Care Provider: PATIENT, NO Other Clinician: Referring Provider: Treating Provider/Extender:Jomes Giraldo, Allen SprinklesMichael Weeks in Treatment: 2 Active Problems ICD-10 Evaluated Encounter Code Description Active Date Today Diagnosis E11.621 Type 2 diabetes mellitus with foot ulcer 07/09/2019 No Yes L97.512 Non-pressure chronic ulcer of other part of right foot 07/09/2019 No Yes with fat layer exposed E11.42 Type 2 diabetes mellitus with diabetic polyneuropathy 07/09/2019 No Yes M21.371 Foot drop, right foot 07/09/2019 No Yes Inactive Problems Resolved Problems Electronic Signature(s) Signed: 07/23/2019 6:41:12 PM By: Allen Najjarobson, Carmel[facility] Violet MD Entered By: Allen Najjarobson, Brek Reece on 07/23/2019 09:28:26 -------------------------------------------------------------------------------- Progress Note Details Patient Name: Date of Service: Allen FurnishLEONARD, Allen Gregory 07/23/2019 8:00 AM Medical Record [id number] Patient Account Number: [id number] Date of Birth/Sex: Treating RN: 12/22/1960 (59 y.o. M) Primary Care Provider: PATIENT, NO Other Clinician: Referring Provider: Treating Provider/Extender:Lothar Prehn, Allen SprinklesMichael Weeks in Treatment: 2 Subjective History of Present Illness (HPI) ADMISSION 07/09/2019 This is a pleasant 59 year old man who referred himself here for second opinion sign a new area on the right plantar first toe and the dorsal part of his right fifth toe. He says he had these in August  when he had removed callus from the first toe and then played pool volleyball for about 4 hours. He thinks the bottom of the pool simply caused an abrasion of the toe. The story sounds accurate. He has been going to podiatry for the last several months. Me a picture on his phone from August and the wound is gotten considerably smaller. He states that it started doing well recently when they started collagen. He has been using a surgical shoe to offload. 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 He had a brace for a long time but now he simply lifts his foot higher to clear but states that most people would not even notice that he had any disability. He is a type 2 diabetes Badik but he has diet-controlled he has lost weight by watching calories. 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 07/16/2019; right fifth toe is closed. Right first toe is smaller. He is using a surgical shoe but he tells me is fairly religious about using his scooter at home. Hopefully this will give him enough offloading to close this. 2/1; right fifth toe dorsally remains closed. Right first toe still not a lot of improvement. Although superficially it looks smaller there is undermining laterally from 6-6 of at least 3 to 4 mm. Also worrisome is that the granulation does not look that healthy. We have been using silver collagen He had his foot x-rayed by his podiatrist but I do not have access to this. I am going to x-ray the right great toe again. Otherwise I am thinking he is going to require a total contact cast probably next week and I discussed this with him today. Objective Constitutional Sitting or standing Blood  Pressure is within target range for patient.. Pulse regular and within target range for patient.Marland Kitchen. Respirations regular, non-labored and within target range.. Temperature is normal and within the target range for the patient.Marland Kitchen. Appears in no distress. Vitals Time Taken: 8:30 AM, Height: 78 in, Weight: 285 lbs, BMI: 32.9, Temperature: 98.1 F, Pulse: 72 bpm, Respiratory Rate: 18 breaths/min,  Blood Pressure: 139/71 mmHg. General Notes: Wound exam; the patient's wound is on the plantar right great toe. Over the interphalangeal joint of the right great toe 3 to 4 mm of 6-6 lateral undermining. There is no palpable bone. I am not healthy with healthy granulation looks. There was no overt evidence of infection using a #5 curette I removed the entire circumference of nonviable tissue. Hemostasis with silver nitrate and direct pressure Integumentary (Hair, Skin) Wound #1 status is Open. Original cause of wound was Gradually Appeared. The wound is located on the Right Toe Great. The wound measures 0.9cm length x 0.4cm width x 0.4cm depth; 0.283cm^2 area and 0.113cm^3 volume. There is Fat Layer (Subcutaneous Tissue) Exposed exposed. There is no tunneling noted, however, there is undermining starting at 6:00 and ending at 12:00 with a maximum distance of 0.7cm. There is a small amount of serosanguineous drainage noted. The wound margin is thickened. There is large (67-100%) red granulation within the wound bed. There is no necrotic tissue within the wound bed. 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 There was a Excisional  Skin/Subcutaneous Tissue Debridement with a total area of 0.36 sq cm performed by Ricard Dillon., MD. With the following instrument(s): Curette to remove Viable and Non-Viable tissue/material. Material removed includes Callus and Subcutaneous Tissue and. No specimens were taken. A time out was conducted at 09:05, prior to the start of the procedure. A Moderate amount of bleeding was controlled with Silver Nitrate. The procedure was tolerated well with a pain level of 0 throughout and a pain level of 0 following the procedure. Post Debridement Measurements: 0.9cm length x 0.4cm width x 0.4cm depth; 0.113cm^3 volume. Character of Wound/Ulcer Post Debridement is improved. Severity of Tissue Post Debridement is: Fat layer exposed. Post procedure Diagnosis Wound #1: Same as Pre-Procedure Plan Follow-up Appointments: Return Appointment in 1 week. Dressing Change Frequency: Wound #1 Right Toe Great: Change Dressing every other day. - may change daily if needed for excess drainage Wound Cleansing: Wound #1 Right Toe Great: May shower and wash wound with soap and water. - on days that dressing is changed Primary Wound Dressing: Wound #1 Right Toe Great: Calcium Alginate with Silver Secondary Dressing: Wound #1 Right Toe Great: Foam - foam donut Kerlix/Rolled Gauze Dry Gauze Other: - felt in surgical shoe Radiology ordered were: X-ray, right great toe - Non healing ulcer on plantar great toe 1. I change the primary dressing to silver alginate 2. X-ray of the toe as described. 3. Plan for a total contact cast next week Electronic Signature(s) Signed: 07/23/2019 6:41:12 PM By: Linton Ham MD Entered By: Linton Ham on 07/23/2019 09:32:30 -------------------------------------------------------------------------------- SuperBill Details Patient Name: Date of Service: Allen Gregory, Allen Gregory 07/23/2019 Medical Record [id number] Patient Account Number: [id number] Date of  Birth/Sex: Treating RN: [birth year] (59 y.o. 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

## 2019-07-24 NOTE — Progress Notes (Addendum)
TREQUAN, MARSOLEK ([id number]) Visit Report for 07/23/2019 Arrival Information Details Patient Name: Date of Service: DAILY, CRATE 07/23/2019 8:00 AM Medical Record [id number] Patient Account Number: [id number] Date of Birth/Sex: Treating RN: [birth year] (58 y.o. [name]) Carlene Coria Primary Care Zhaniya Swallows: PATIENT, NO Other Clinician: Referring Deonna Krummel: Treating Trampus Mcquerry/Extender:Robson, Esperanza Richters in Treatment: 2 Visit Information History Since Last Visit All ordered tests and consults were completed: No Patient Arrived: Knee Scooter Added or deleted any medications: No Arrival Time: 08:28 Any new allergies or adverse reactions: No Accompanied By: self Had a fall or experienced change in No Transfer Assistance: None activities of daily living that may affect Patient Identification Verified: Yes risk of falls: Secondary Verification Process Yes Signs or symptoms of abuse/neglect since last No Completed: visito Patient Requires Transmission- No Hospitalized since last visit: No Based Precautions: Implantable device outside of the clinic excluding No Patient Has Alerts: Yes cellular tissue based products placed in the center Patient Alerts: Patient on Blood since last visit: Thinner Has Dressing in Place as Prescribed: Yes Pain Present Now: No Electronic Signature(s) Signed: 07/24/2019 5:23:18 PM By: Carlene Coria RN Entered By: Carlene Coria on 07/23/2019 08:30:48 -------------------------------------------------------------------------------- Encounter Discharge Information Details Patient Name: Date of Service: TRAMMELL, BOWDEN 07/23/2019 8:00 AM Medical Record [id number] Patient Account Number: [id number] Date of Birth/Sex: Treating RN: 1960/11/01 (59 y.o. [name] Primary Care Tymeer Vaquera: PATIENT, NO Other Clinician: Referring Sharisa Toves: Treating Ailin Rochford/Extender:Robson, Esperanza Richters in Treatment: 2 Encounter Discharge Information Items Post Procedure  Vitals Discharge Condition: Stable Temperature (F): 98.1 Ambulatory Status: Walker Pulse (bpm): 72 Discharge Destination: Home Respiratory Rate (breaths/min): 18 Transportation: Private Auto Blood Pressure (mmHg): 139/71 Accompanied By: self Schedule Follow-up Appointment: Yes Clinical Summary of Care: Electronic Signature(s) Signed: 07/23/2019 6:01:21 PM By: Deon Pilling Entered By: Deon Pilling on 07/23/2019 09:21:22 -------------------------------------------------------------------------------- Lower Extremity Assessment Details Patient Name: Date of Service: RONTRELL, MOQUIN 07/23/2019 8:00 AM Medical Record [id number] Patient Account Number: [id number] Date of Birth/Sex: Treating RN: 12-03-1960 (58 y.o. [name]) Carlene Coria Primary Care Michalina Calbert: PATIENT, NO Other Clinician: Referring Michaella Imai: Treating Avrie Kedzierski/Extender:Robson, Esperanza Richters in Treatment: 2 Edema Assessment Assessed: [Left: No] [Right: No] Edema: [Left: N] [Right: o] Calf Left: Right: Point of Measurement: 38 cm From Medial Instep cm 42 cm Ankle Left: Right: Point of Measurement: 10 cm From Medial Instep cm 26 cm Electronic Signature(s) Signed: 07/24/2019 5:23:18 PM By: Carlene Coria RN Entered By: Carlene Coria on 07/23/2019 08:37:20 -------------------------------------------------------------------------------- Multi Wound Chart Details Patient Name: Date of Service: TELLY, JAWAD 07/23/2019 8:00 AM Medical Record [id number] Patient Account Number: [id number] Date of Birth/Sex: Treating RN: [birth year] (59 y.o. M) Primary Care Jewel Venditto: PATIENT, NO Other Clinician: Referring Justa Hatchell: Treating [place] Kielbasa/Extender:Robson, Esperanza Richters in Treatment: 2 Vital Signs Height(in): 78 Pulse(bpm): 72 Weight(lbs): 285 Blood Pressure(mmHg): 139/71 Body Mass Index(BMI): 33 Temperature(F): 98.1 Respiratory 18 Rate(breaths/min): Photos: [1:No Photos] [N/A:N/A] Wound Location: [1:Right Toe Great]  [N/A:N/A] Wounding Event: [1:Gradually Appeared] [N/A:N/A] Primary Etiology: [1:Diabetic Wound/Ulcer of the N/A Lower Extremity] Comorbid History: [1:Pneumothorax, Deep Vein N/A Thrombosis, Type II Diabetes, Neuropathy] Date Acquired: [1:01/20/2019] [N/A:N/A] Weeks of Treatment: [1:2] [N/A:N/A] Wound Status: [1:Open] [N/A:N/A] Measurements L x W x D 0.9x0.4x0.4 [N/A:N/A] (cm) Area (cm) : [1:0.283] [N/A:N/A] Volume (cm) : [1:0.113] [N/A:N/A] % Reduction in Area: [1:39.90%] [N/A:N/A] % Reduction in Volume: 52.10% [N/A:N/A] Starting Position 1 [1:6] (o'clock): Ending Position 1 [1:12] (o'clock): Maximum Distance 1 [1:0.7] (cm): Undermining: [1:Yes] [N/A:N/A] Classification: [1:Grade 1] [N/A:N/A] Exudate Amount: [1:Small] [N/A:N/A] Exudate Type: [1:Serosanguineous] [N/A:N/A] Exudate Color: [1:red, brown] [N/A:N/A] Wound Margin: [1:Thickened] [N/A:N/A]  Granulation Amount: [1:Large (67-100%)] [N/A:N/A] Granulation Quality: [1:Red] [N/A:N/A] Necrotic Amount: [1:None Present (0%)] [N/A:N/A] Exposed Structures: [1:Fat Layer (Subcutaneous N/A Tissue) Exposed: Yes Fascia: No Tendon: No Muscle: No Joint: No Bone: No] Epithelialization: [1:None] [N/A:N/A] Debridement: [1:Debridement - Excisional N/A] Pre-procedure [1:09:05] [N/A:N/A] Verification/Time Out Taken: Tissue Debrided: [1:Callus, Subcutaneous] [N/A:N/A] Level: [1:Skin/Subcutaneous Tissue N/A] Debridement Area (sq cm):0.36 [N/A:N/A] Instrument: [1:Curette] [N/A:N/A] Bleeding: [1:Moderate] [N/A:N/A] Hemostasis Achieved: [1:Silver Nitrate] [N/A:N/A] Procedural Pain: [1:0] [N/A:N/A] Post Procedural Pain: [1:0] [N/A:N/A] Debridement Treatment [1:Procedure was tolerated] [N/A:N/A] Response: [1:well] Post Debridement [1:0.9x0.4x0.4] [N/A:N/A] Measurements L x W x D (cm) Post Debridement [1:0.113] [N/A:N/A] Volume: (cm) Procedures Performed: [1:Debridement] [N/A:N/A] Treatment Notes Wound #1 (Right Toe Great) 1. Cleanse  With Wound Cleanser 3. Primary Dressing Applied Calcium Alginate Ag 4. Secondary Dressing Dry Gauze Roll Gauze 5. Secured With Medipore tape Notes applied several gauze to aid in pressure to stop bleeding. Electronic Signature(s) Signed: 07/23/2019 6:41:12 PM By: Baltazar Najjar MD Entered By: Baltazar Najjar on 07/23/2019 37:10:62 -------------------------------------------------------------------------------- Multi-Disciplinary Care Plan Details Patient Name: Date of Service: ROOK, MAUE 07/23/2019 8:00 AM Medical Record [id number] Patient Account Number: [id number] Date of Birth/Sex: Treating RN: 1961-03-12 (59 y.o. [name] Primary Care Kynsli Haapala: PATIENT, NO Other Clinician: Referring Charliegh Vasudevan: Treating Tam Delisle/Extender:Robson, [name] in Treatment: 2 Active Inactive Nutrition Nursing Diagnoses: Impaired glucose control: actual or potential Potential for alteratiion in Nutrition/Potential for imbalanced nutrition Goals: Patient/caregiver agrees to and verbalizes understanding of need to use nutritional supplements and/or vitamins as prescribed Date Initiated: 07/09/2019 Target Resolution Date: 08/10/2019 Goal Status: Active Patient/caregiver will maintain therapeutic glucose control Date Initiated: 07/09/2019 Target Resolution Date: 08/10/2019 Goal Status: Active Interventions: Assess HgA1c results as ordered upon admission and as needed Assess patient nutrition upon admission and as needed per policy Provide education on elevated blood sugars and impact on wound healing Provide education on nutrition Treatment Activities: Education provided on Nutrition : 07/09/2019 Notes: Wound/Skin Impairment Nursing Diagnoses: Impaired tissue integrity Knowledge deficit related to ulceration/compromised skin integrity Goals: Patient/caregiver will verbalize understanding of skin care regimen Date Initiated: 07/09/2019 Target Resolution Date: 08/10/2019 Goal  Status: Active Interventions: Assess patient/caregiver ability to obtain necessary supplies Assess patient/caregiver ability to perform ulcer/skin care regimen upon admission and as needed Assess ulceration(s) every visit Provide education on ulcer and skin care Notes: Electronic Signature(s) Signed: 07/23/2019 6:39:44 PM By: Zandra Abts RN, BSN Entered By: Zandra Abts on 07/23/2019 09:06:02 -------------------------------------------------------------------------------- Pain Assessment Details Patient Name: Date of Service: BARETT, WHIDBEE 07/23/2019 8:00 AM Medical Record [id number] Patient Account Number: [id number] Date of Birth/Sex: Treating RN: Oct 26, 1960 (58 y.o. [name]) Yevonne Pax Primary Care Chandlor Noecker: PATIENT, NO Other Clinician: Referring Beauford Lando: Treating Maliki Gignac/Extender:Robson, [name] in Treatment: 2 Active Problems Location of Pain Severity and Description of Pain Patient Has Paino No Patient Has Paino No Site Locations Pain Management and Medication Current Pain Management: Electronic Signature(s) Signed: 07/24/2019 5:23:18 PM By: Yevonne Pax RN Entered By: Yevonne Pax on 07/23/2019 08:32:53 -------------------------------------------------------------------------------- Patient/Caregiver Education Details Patient Name: Date of Service: Fielding, Mccaster 2/1/2021andnbsp8:00 AM Medical Record [id number] Patient Account Number: [id number] Date of Birth/Gender: Treating RN: 1961-04-08 (59 y.o. [name] Primary Care Physician: PATIENT, NO Other Clinician: Referring Physician: Treating Physician/Extender:Robson, [name] in Treatment: 2 Education Assessment Education Provided To: Patient Education Topics Provided Wound/Skin Impairment: Methods: Explain/Verbal Responses: State content correctly Electronic Signature(s) Signed: 07/23/2019 6:39:44 PM By: Zandra Abts RN, BSN Entered By: Zandra Abts on 07/23/2019  09:06:15 -------------------------------------------------------------------------------- Wound Assessment Details Patient Name: Date of Service: Kizzie Furnish 07/23/2019 8:00  AM Medical Record [id number] Patient Account Number: [id number] Date of Birth/Sex: Treating RN: [birth year] (59 y.o. M) Primary Care Trase Bunda: PATIENT, NO Other Clinician: Referring Chapel Silverthorn: Treating Lanique Gonzalo/Extender:Robson, [name] in Treatment: 2 Wound Status Wound Number: 1 Primary Diabetic Wound/Ulcer of the Lower Etiology: Extremity Wound Location: Right Toe Great Wound Open Wounding Event: Gradually Appeared Status: Date Acquired: 01/20/2019 Comorbid Pneumothorax, Deep Vein Thrombosis, Type Weeks Of Treatment: 2 History: II Diabetes, Neuropathy Clustered Wound: No Photos Wound Measurements Length: (cm) 0.9 % Reduction in Are Width: (cm) 0.4 % Reduction in Vol Depth: (cm) 0.4 Epithelialization: Area: (cm) 0.283 Tunneling: Volume: (cm) 0.113 Undermining: Starting Positi Ending Position Maximum Distanc a: 39.9% ume: 52.1% None No Yes on (o'clock): 6 (o'clock): 12 e: (cm) 0.7 Wound Description Classification: Grade 1 Foul Odor After Cl Wound Margin: Thickened Slough/Fibrino Exudate Amount: Small Exudate Type: Serosanguineous Exudate Color: red, brown Wound Bed Granulation Amount: Large (67-100%) Granulation Quality: Red Fascia Exposed: Necrotic Amount: None Present (0%) Fat Layer (Subcuta Tendon Exposed: Muscle Exposed: Joint Exposed: Bone Exposed: Treatment Notes Wound #1 (Right Toe Great) 1. Cleanse With Wound Cleanser 3. Primary Dressing Applied Calcium Alginate Ag 4. Secondary Dressing Dry Gauze Roll Gauze 5. Secured With Medipore tape Notes applied several gauze to aid in pressure to stop bleeding. Electronic Signature(s) Signed: 07/30/2019 4:47:04 PM By: Benjaman Kindler EMT/HBOT Previous Signature: 07/24/2019 5:23:18 PM Version By: Yevonne Pax Entered  By: Benjaman Kindler on 02/ eansing: No No Exposed Structure No neous Tissue) Exposed: Yes No No No No RN 01/2020 13:46:31 -------------------------------------------------------------------------------- Vitals Details Patient Name: Date of Service: CAYLON, SAINE 07/23/2019 8:00 AM Medical Record [id number] Patient Account Number: [id number] Date of Birth/Sex: Treating RN: [birth year] (58 y.o. [name]) Yevonne Pax Primary Care Coron Rossano: PATIENT, NO Other Clinician: Referring Brandy Zuba: Treating Ruven Corradi/Extender:Robson, [name] in Treatment: 2 Vital Signs Time Taken: 08:30 Temperature (F): 98.1 Height (in): 78 Pulse (bpm): 72 Weight (lbs): 285 Respiratory Rate (breaths/min): 18 Body Mass Index (BMI): 32.9 Blood Pressure (mmHg): 139/71 Reference Range: 80 - 120 mg / dl Electronic Signature(s) Signed: 07/24/2019 5:23:18 PM By: Yevonne Pax RN Entered By: Yevonne Pax on 07/23/2019 08:32:47

## 2019-07-27 ENCOUNTER — Other Ambulatory Visit (HOSPITAL_COMMUNITY): Payer: Self-pay | Admitting: Internal Medicine

## 2019-07-27 ENCOUNTER — Ambulatory Visit (HOSPITAL_COMMUNITY)
Admission: RE | Admit: 2019-07-27 | Discharge: 2019-07-27 | Disposition: A | Discharge status: Home / Self Care | Payer: 59 | Source: Ambulatory Visit | Attending: Internal Medicine | Admitting: Internal Medicine

## 2019-07-27 ENCOUNTER — Other Ambulatory Visit: Payer: Self-pay

## 2019-07-27 DIAGNOSIS — M869 Osteomyelitis, unspecified: Secondary | ICD-10-CM | POA: Insufficient documentation

## 2019-07-27 DIAGNOSIS — L97509 Non-pressure chronic ulcer of other part of unspecified foot with unspecified severity: Secondary | ICD-10-CM | POA: Insufficient documentation

## 2019-07-27 DIAGNOSIS — E1169 Type 2 diabetes mellitus with other specified complication: Secondary | ICD-10-CM | POA: Diagnosis present

## 2019-07-27 DIAGNOSIS — E11621 Type 2 diabetes mellitus with foot ulcer: Secondary | ICD-10-CM | POA: Diagnosis present

## 2019-07-30 ENCOUNTER — Encounter (HOSPITAL_BASED_OUTPATIENT_CLINIC_OR_DEPARTMENT_OTHER): Payer: 59 | Admitting: Internal Medicine

## 2019-07-31 ENCOUNTER — Other Ambulatory Visit: Payer: Self-pay

## 2019-07-31 ENCOUNTER — Encounter (HOSPITAL_BASED_OUTPATIENT_CLINIC_OR_DEPARTMENT_OTHER): Payer: 59 | Attending: Internal Medicine | Admitting: Internal Medicine

## 2019-07-31 DIAGNOSIS — L97512 Non-pressure chronic ulcer of other part of right foot with fat layer exposed: Secondary | ICD-10-CM | POA: Diagnosis not present

## 2019-07-31 NOTE — Progress Notes (Signed)
Allen Gregory, Allen Gregory ([id number]) Visit Report for 07/31/2019 HPI Details Patient Name: Date of Service: Allen Gregory, Allen Gregory 07/31/2019 9:00 AM Medical Record [id number] Patient Account Number: [id number] Date of Birth/Sex: Treating RN: 1961/06/10 (58 y.o. [name]) Yevonne Pax Primary Care Provider: PATIENT, NO Other Clinician: Referring Provider: Treating Provider/Extender:Aizlyn Schifano, [name] in Treatment: 3 History of Present Illness HPI Description: ADMISSION 07/09/2019 This is a pleasant 59 year old man who referred himself here for second opinion sign a new area on the right plantar first toe and the dorsal part of his right fifth toe. He says he had these in August when he had removed callus from the first toe and then played pool volleyball for about 4 hours. He thinks the bottom of the pool simply caused an abrasion of the toe. The story sounds accurate. He has been going to podiatry for the last several months. Me a picture on his phone from August and the wound is gotten considerably smaller. He states that it started doing well recently when they started collagen. He has been using a surgical shoe to offload. 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 He had a brace for a long time but now he simply lifts his foot higher to clear but states that most people would not even notice that he had any disability. He is a type 2 diabetes Badik but he has diet-controlled he has lost weight by watching calories. 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 07/16/2019; right fifth toe is closed. Right first toe is smaller. He is using a surgical shoe but he tells me is fairly religious about using his scooter  at home. Hopefully this will give him enough offloading to close this. 2/1; right fifth toe dorsally remains closed. Right first toe still not a lot of improvement. Although superficially it looks smaller there is undermining laterally from 6-6 of at least 3 to 4 mm. Also worrisome is that the granulation does not look that healthy. We have been using silver collagen He had his foot x-rayed by his podiatrist but I do not have access to this. I am going to x-ray the right great toe again. Otherwise I am thinking he is going to require a total contact cast probably next week and I discussed this with him today. 2/9; his x-ray of the right foot did not show plain x-ray evidence of osteomyelitis. We have been using silver alginate the wound is not improved looks static. He is going to get a total contact cast today He comes in today telling me that before he left podiatry before he came here he had noninvasive arterial studies that were done by a mobile service and the podiatrist office. They are now trying to arrange I think an angiogram however this would be in [facility]. He asked my opinion on this and I told him that angiograms are done by several different doctor groups locally and if he is from [place] I cannot see a reason to go out of [facility] for any procedure he might need. We will see if we can get the actual angiogram report and I will refer him as needed to either vein and vascular or interventional cardiology Electronic Signature(s) Signed:  07/31/2019 5:08:08 PM By: Baltazar Najjar MD Entered By: Baltazar Najjar on 07/31/2019 10:14:56 -------------------------------------------------------------------------------- Physical Exam Details Patient Name: Date of Service: Allen Gregory, Allen Gregory 07/31/2019 9:00 AM Medical Record [id number] Patient Account Number: [id number] Date of Birth/Sex: Treating RN: 06/13/1961 (58 y.o. [name]) Yevonne Pax Primary Care Provider: PATIENT,  NO Other Clinician: Referring Provider: Treating Provider/Extender:Tristy Udovich, [name] in Treatment: 3 Constitutional Patient is hypertensive.. Pulse regular and within target range for patient.Marland Kitchen Respirations regular, non-labored and within target range.. Temperature is normal and within the target range for the patient.Marland Kitchen Appears in no distress. Notes Wound exam; patient's wound is on the plantar right great toe. There is no palpable bone. The base of this does not look terribly unhealthy. Electronic Signature(s) Signed: 07/31/2019 5:08:08 PM By: Baltazar Najjar MD Entered By: Baltazar Najjar on 07/31/2019 10:16:53 -------------------------------------------------------------------------------- Physician Orders Details Patient Name: Date of Service: Allen Gregory, Allen Gregory 07/31/2019 9:00 AM Medical Record [id number] Patient Account Number: [id number] Date of Birth/Sex: Treating RN: May 14, 1961 (58 y.o. 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 - MD visit Other: - return appointment for 1st cast change on Thursday or Friday Dressing Change Frequency Wound #1 Right Toe Great Change Dressing every other day. - may change daily if needed for excess drainage Wound Cleansing Wound #1 Right Toe Great May shower and wash wound with soap and water. - on days that dressing is changed Primary Wound Dressing Wound #1 Right Toe Great Calcium Alginate with Silver Secondary Dressing Wound #1 Right Toe Great Foam - foam donut Dry  Gauze Off-Loading Total Contact Cast to Right Lower Extremity Electronic Signature(s) Signed: 07/31/2019 5:08:08 PM By: Baltazar Najjar MD Signed: 07/31/2019 5:19:34 PM By: Yevonne Pax RN Entered By: Yevonne Pax on 07/31/2019 10:23:03 -------------------------------------------------------------------------------- Problem List Details Patient Name: Date of Service: Allen Gregory, Allen Gregory 07/31/2019 9:00 AM Medical Record [id number] Patient Account Number: [id number] Date of Birth/Sex: Treating RN: [birth year] (58 y.o. [name]) Jettie Pagan, Nikolski Primary Care Provider: PATIENT, NO Other Clinician: Referring Provider: Treating Provider/Extender:Maddalena Linarez, [name] in Treatment: 3 Active Problems ICD-10 Evaluated Encounter Code Description Active Date Today Diagnosis E11.621 Type 2 diabetes mellitus with foot ulcer 07/09/2019 No Yes L97.512 Non-pressure chronic ulcer of other part of right foot 07/09/2019 No Yes with fat layer exposed E11.42 Type 2 diabetes mellitus with diabetic polyneuropathy 07/09/2019 No Yes M21.371 Foot drop, right foot 07/09/2019 No Yes E11.51 Type 2 diabetes mellitus with diabetic peripheral 07/31/2019 No Yes angiopathy without gangrene Inactive Problems Resolved Problems Electronic Signature(s) Signed: 07/31/2019 5:08:08 PM By: Baltazar Najjar MD Entered By: Baltazar Najjar on 07/31/2019 11:53:02 -------------------------------------------------------------------------------- Progress Note Details Patient Name: Date of Service: Allen Gregory, Allen Gregory 07/31/2019 9:00 AM Medical Record [id number] Patient Account Number: [id number] Date of Birth/Sex: Treating RN: [birth year] (58 y.o. [name]) Yevonne Pax Primary Care Provider: PATIENT, NO Other Clinician: Referring Provider: Treating Provider/Extender:Babak Lucus, [name] in Treatment: 3 Subjective History of Present Illness (HPI) ADMISSION 07/09/2019 This is a pleasant 59 year old man who referred himself here for second opinion sign  a new area on the right plantar first toe and the dorsal part of his right fifth toe. He says he had these in August when he had  removed callus from the first toe and then played pool volleyball for about 4 hours. He thinks the bottom of the pool simply caused an abrasion of the toe. The story sounds accurate. He has been going to podiatry for the last several months. Me a picture on his phone from August and the wound is gotten considerably smaller. He states that it started doing well recently when they started collagen. He has been using a surgical shoe to offload. 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 He had a brace for a long time but now he simply lifts his foot higher to clear but states that most people would not even notice that he had any disability. He is a type 2 diabetes Badik but he has diet-controlled he has lost weight by watching calories. 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 07/16/2019; right fifth toe is closed. Right first toe is smaller. He is using a surgical shoe but he tells me is fairly religious about using his scooter at home. Hopefully this will give him enough offloading to close this. 2/1; right fifth toe dorsally remains closed. Right first toe still not a lot of improvement. Although superficially it looks smaller there is undermining laterally from 6-6 of at least 3 to 4 mm. Also worrisome is that the granulation does not look that healthy. We have been using silver collagen He had his foot x-rayed by his podiatrist but I do not have access to this. I am going to x-ray the right great toe again. Otherwise I am thinking he is going to require a total contact  cast probably next week and I discussed this with him today. 2/9; his x-ray of the right foot did not show plain x-ray evidence of osteomyelitis. We have been using silver alginate the wound is not improved looks static. He is going to get a total contact cast today He comes in today telling me that before he left podiatry before he came here he had noninvasive arterial studies that were done by a mobile service and the podiatrist office. They are now trying to arrange I think an angiogram however this would be in [facility]. He asked my opinion on this and I told him that angiograms are done by several different doctor groups locally and if he is from [place] I cannot see a reason to go out of [facility] for any procedure he might need. We will see if we can get the actual angiogram report and I will refer him as needed to either vein and vascular or interventional cardiology Objective Constitutional Patient is hypertensive.. Pulse regular and within target range for patient.Marland Kitchen Respirations regular, non-labored and within target range.. Temperature is normal and within the target range for the patient.Marland Kitchen Appears in no distress. Vitals Time Taken: 9:05 AM, Height: 78 in, Weight: 285 lbs, BMI: 32.9, Temperature: 98.6 F, Pulse: 70 bpm, Respiratory Rate: 18 breaths/min, Blood Pressure: 140/78 mmHg. General Notes: Wound exam; patient's wound is on the plantar right great toe. There is no palpable bone. The base of this does not look terribly unhealthy. Integumentary (Hair, Skin) Wound #1 status is Open. Original cause of wound was Gradually Appeared. The wound  is located on the Right Toe Great. The wound measures 1.2cm length x 0.8cm width x 0.4cm depth; 0.754cm^2 area and 0.302cm^3 volume. There is Fat Layer (Subcutaneous Tissue) Exposed exposed. There is no tunneling or undermining noted. There is a small amount of serosanguineous drainage noted. The wound margin is thickened.  There is large (67-100%) pink granulation within the wound bed. There is a small (1-33%) amount of necrotic tissue within the wound bed including Adherent Slough. 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 Dressing Change Frequency: Wound #1 Right Toe Great: Change Dressing every other day. - may change daily if needed for excess drainage Wound Cleansing: Wound #1 Right Toe Great: May shower and wash wound with soap and water. - on days that dressing is changed Primary Wound Dressing: Wound #1 Right Toe Great: Calcium Alginate with Silver Secondary Dressing: Wound #1 Right Toe Great: Foam - foam donut Dry Gauze Off-Loading: Total Contact Cast to Right Lower Extremity 1. With silver alginate. I am going to put this on the total contact cast. I did not redebride this today. 2. I will try to get the results of his vascular studies from the podiatry office and refer him as needed. His ABI in our clinic was 1.03 and his peripheral pulses are vibrant. We would not have otherwise ordered the studies however the review we did was not perfect. I will look at the studies he has had and refer him probably to interventional cardiology. However based on the ABI here and clinical exam I do not think a total contact cast is prohibitive 3. I remove the undermining area last visit he does not have any current undermining. No debridement today. Electronic Signature(s) Signed: 07/31/2019 5:08:08 PM By: Linton Ham MD Entered By: Linton Ham on 07/31/2019 10:21:18 -------------------------------------------------------------------------------- Total Contact Cast  Details Patient Name: Date of Service: Allen Gregory, Allen Gregory 07/31/2019 9:00 AM Medical Record [id number] Patient Account Number: [id number] Date of Birth/Sex: Treating RN: 05/27/1961 (58 y.o. [name]) Carlene Coria Primary Care Provider: PATIENT, NO Other Clinician: Referring Provider: Treating Provider/Extender:Winford Hehn, Esperanza Richters in Treatment: 3 Total Contact Cast Applied for Wound Assessment: Wound #1 Right Toe Great Performed By: Physician Ricard Dillon., MD Post Procedure Diagnosis Same as Pre-procedure Electronic Signature(s) Signed: 07/31/2019 5:08:08 PM By: Linton Ham MD Entered By: Linton Ham on 07/31/2019 11:53:33 -------------------------------------------------------------------------------- SuperBill Details Patient Name: Date of Service: Allen Gregory, Allen Gregory 07/31/2019 Medical Record [id number] Patient Account Number: [id number] Date of Birth/Sex: Treating RN: Mar 03, 1961 (58 y.o. 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 M21.371 Foot drop, right foot E11.51 Type 2 diabetes mellitus with diabetic peripheral angiopathy without gangrene Facility Procedures CPT4 Code Description: [id number] - APPLY TOTAL CONTACT LEG CAST ICD-10 Diagnosis Description L97.512 Non-pressure chronic ulcer of other part of right foot wi E11.621 Type 2 diabetes mellitus with foot ulcer Modifier: th fat layer ex Quantity: 1 posed Physician Procedures CPT4 Code Description: [id number] - WC PHYS APPLY TOTAL CONTACT CAST ICD-10 Diagnosis Description  D42.876 Non-pressure chronic ulcer of other part of right foot wit E11.621 Type 2 diabetes  mellitus with foot ulcer Modifier: h fat layer ex Quantity: 1 posed Electronic Signature(s) Signed: 07/31/2019 5:08:08 PM By: Baltazar Najjar MD Entered By: Baltazar Najjar on 07/31/2019 11:54:34

## 2019-08-03 ENCOUNTER — Encounter (HOSPITAL_BASED_OUTPATIENT_CLINIC_OR_DEPARTMENT_OTHER): Payer: 59 | Admitting: Internal Medicine

## 2019-08-03 ENCOUNTER — Other Ambulatory Visit: Payer: Self-pay

## 2019-08-03 DIAGNOSIS — L97512 Non-pressure chronic ulcer of other part of right foot with fat layer exposed: Secondary | ICD-10-CM | POA: Diagnosis not present

## 2019-08-06 NOTE — Progress Notes (Signed)
ADONIRAM, CICOTTE ([id number]) Visit Report for 08/03/2019 HPI Details Patient Name: Date of Service: ELSTER, PETERSON 08/03/2019 2:45 PM Medical Record [id number] Patient Account Number: [id number] Date of Birth/Sex: Treating RN: Nov 08, 1960 (60 y.o. [name] Primary Care Provider: PATIENT, NO Other Clinician: Referring Provider: Treating Provider/Extender:Aldean Pipe, [name] in Treatment: 3 History of Present Illness HPI Description: ADMISSION 07/09/2019 This is a pleasant 59 year old man who referred himself here for second opinion sign a new area on the right plantar first toe and the dorsal part of his right fifth toe. He says he had these in August when he had removed callus from the first toe and then played pool volleyball for about 4 hours. He thinks the bottom of the pool simply caused an abrasion of the toe. The story sounds accurate. He has been going to podiatry for the last several months. Me a picture on his phone from August and the wound is gotten considerably smaller. He states that it started doing well recently when they started collagen. He has been using a surgical shoe to offload. 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 He had a brace for a long time but now he simply lifts his foot higher to clear but states that most people would not even notice that he had any disability. He is a type 2 diabetes Badik but he has diet-controlled he has lost weight by watching calories. 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 07/16/2019; right fifth toe is closed. Right first toe is smaller. He is using a surgical shoe but he tells me is fairly religious about using his  scooter at home. Hopefully this will give him enough offloading to close this. 2/1; right fifth toe dorsally remains closed. Right first toe still not a lot of improvement. Although superficially it looks smaller there is undermining laterally from 6-6 of at least 3 to 4 mm. Also worrisome is that the granulation does not look that healthy. We have been using silver collagen He had his foot x-rayed by his podiatrist but I do not have access to this. I am going to x-ray the right great toe again. Otherwise I am thinking he is going to require a total contact cast probably next week and I discussed this with him today. 2/9; his x-ray of the right foot did not show plain x-ray evidence of osteomyelitis. We have been using silver alginate the wound is not improved looks static. He is going to get a total contact cast today He comes in today telling me that before he left podiatry before he came here he had noninvasive arterial studies that were done by a mobile service and the podiatrist office. They are now trying to arrange I think an angiogram however this would be in [facility]. He asked my opinion on this and I told him that angiograms are done by several different doctor groups locally and if he is from [place] I cannot see a reason to go out of [facility] for any procedure he might need. We will see if we can get the actual angiogram report and I will refer him as needed to either vein and vascular or interventional cardiology 2/12; back for  his first obligatory total contact cast change. I did get his arterial studies from [facility]. Biphasic waveforms ABI in the right of 1.16 on the left at 1.14 I am not really sure what was so concerning about this [place] I will simply repeat these studies along with TBI's locally. There should not be any need for him to go out of town to have this evaluation. I am not sure  that there is a clinical need Electronic Signature(s) Signed: 08/03/2019 5:45:04 PM By: Baltazar Najjar MD Entered By: Baltazar Najjar on 08/03/2019 15:27:54 -------------------------------------------------------------------------------- Physical Exam Details Patient Name: Date of Service: MONTGOMERY, FAVOR 08/03/2019 2:45 PM Medical Record [id number] Patient Account Number: [id number] Date of Birth/Sex: Treating RN: 1961/05/08 (59 y.o. [name] Primary Care Provider: PATIENT, NO Other Clinician: Referring Provider: Treating Provider/Extender:Cleota Pellerito, [name] in Treatment: 3 Constitutional Patient is hypertensive.. Pulse regular and within target range for patient.Marland Kitchen Respirations regular, non-labored and within target range.. Temperature is normal and within the target range for the patient.Marland Kitchen Appears in no distress. Notes Wound exam; patient's wound is on the right plantar great toe. Not much different from 2 days ago. No overt infection. His pedal pulses both dorsalis pedis and posterior tibial pulses are palpable Electronic Signature(s) Signed: 08/03/2019 5:45:04 PM By: Baltazar Najjar MD Entered By: Baltazar Najjar on 08/03/2019 15:29:02 -------------------------------------------------------------------------------- Physician Orders Details Patient Name: Date of Service: SHINE, MIKES 08/03/2019 2:45 PM Medical Record [id number] Patient Account Number: [id number] Date of Birth/Sex: Treating RN: 08/12/1960 (59 y.o. 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 - MD visit Dressing Change Frequency Wound #1 Right Toe Great Do not change entire dressing for one week. Wound Cleansing Wound #1 Right Toe Great May shower with protection. - cast protector Primary Wound Dressing Wound #1 Right Toe Great Calcium Alginate with Silver Secondary Dressing Wound #1 Right Toe Great Foam - foam donut Dry Gauze Off-Loading Total Contact Cast to Right Lower Extremity Services and Therapies Arterial Studies- Bilateral with TBIs and arterial dopplers - Non healing ulcer on right great toe. ABI's, TBI's, and Arterial Dopplers to bilateral lower legs. Dr. Allyson Sabal - (ICD10 [phone] - Type 2 diabetes mellitus with foot ulcer) Electronic Signature(s) Signed: 08/03/2019 5:45:04 PM By: Baltazar Najjar MD Signed: 08/06/2019 6:12:41 PM By: Cherylin Mylar Entered By: Cherylin Mylar on 08/03/2019 15:03:22 -------------------------------------------------------------------------------- Prescription 08/03/2019 Patient Name: Kizzie Furnish Provider: Baltazar Najjar MD Date of Birth: March 13, 1961 NPI#: [id number] Sex: M DEA#: [id number] Phone #: [phone] License #: [id number] Patient Address: Eligha [facility] [address] [place], [place] [address] [address] [place], [place] [address] [phone] Allergies lisinopril Reaction: cough Provider's Orders Arterial Studies- Bilateral with TBIs and arterial dopplers - ICD10: E11.621 - Non healing ulcer on right great toe. ABI's, TBI's, and Arterial Dopplers to bilateral lower legs. Dr. Merdis Delay): Date(s): Electronic Signature(s) Signed: 08/03/2019 5:45:04 PM By: Baltazar Najjar MD Signed: 08/06/2019 6:12:41 PM By: Cherylin Mylar Entered By: Cherylin Mylar  on 08/03/2019  15:03:23 --------------------------------------------------------------------------------  Problem List Details Patient Name: Date of Service: ELVERT, CUMPTON 08/03/2019 2:45 PM Medical Record [id number] Patient Account Number: [id number] Date of Birth/Sex: Treating RN: 1960-08-14 (59 y.o. [name] Primary Care Provider: PATIENT, NO Other Clinician: Referring Provider: Treating Provider/Extender:Legacy Carrender, [name] in Treatment: 3 Active Problems ICD-10 Evaluated Encounter Code Description Active Date Today Diagnosis E11.621 Type 2 diabetes mellitus with foot ulcer 07/09/2019 No Yes L97.512 Non-pressure chronic ulcer of other part of right foot 07/09/2019 No Yes with fat layer exposed E11.42 Type 2 diabetes mellitus with diabetic polyneuropathy 07/09/2019 No Yes M21.371 Foot drop, right foot 07/09/2019 No Yes E11.51 Type 2 diabetes mellitus with diabetic peripheral 07/31/2019 No Yes angiopathy without gangrene Inactive Problems Resolved Problems Electronic Signature(s) Signed: 08/03/2019 5:45:04 PM By: Baltazar Najjar MD Entered By: Baltazar Najjar on 08/03/2019 15:25:36 -------------------------------------------------------------------------------- Progress Note Details Patient Name: Date of Service: LEX, LINHARES 08/03/2019 2:45 PM Medical Record [id number] Patient Account Number: [id number] Date of Birth/Sex: Treating RN: May 02, 1961 (59 y.o. [name] Primary Care Provider: PATIENT, NO Other Clinician: Referring Provider: Treating Provider/Extender:Dniyah Grant, [name] in Treatment: 3 Subjective History of Present Illness (HPI) ADMISSION 07/09/2019 This is a pleasant 59 year old man who referred himself here for second opinion sign a new area on the right plantar first toe and the dorsal part of his right fifth toe. He says he had these in August when he had removed callus from the first toe and then played pool volleyball for about 4  hours. He thinks the bottom of the pool simply caused an abrasion of the toe. The story sounds accurate. He has been going to podiatry for the last several months. Me a picture on his phone from August and the wound is gotten considerably smaller. He states that it started doing well recently when they started collagen. He has been using a surgical shoe to offload. 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 He had a brace for a long time but now he simply lifts his foot higher to clear but states that most people would not even notice that he had any disability. He is a type 2 diabetes Badik but he has diet-controlled he has lost weight by watching calories. 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 07/16/2019; right fifth toe is closed. Right first toe is smaller. He is using a surgical shoe but he tells me is fairly religious about using his scooter at home. Hopefully this will give him enough offloading to close this. 2/1; right fifth toe dorsally remains closed. Right first toe still not a lot of improvement. Although superficially it looks smaller there is undermining laterally from 6-6 of at least 3 to 4 mm. Also worrisome is that the granulation does not look that healthy. We have been using silver collagen He had his foot x-rayed by his podiatrist but I do not have access to this. I am going to x-ray the right great toe again. Otherwise I am thinking he is going to require a total contact cast probably next week and I discussed this with him today. 2/9; his x-ray of the right foot did not show plain x-ray evidence of osteomyelitis. We have been  using silver alginate the wound is not improved  looks static. He is going to get a total contact cast today He comes in today telling me that before he left podiatry before he came here he had noninvasive arterial studies that were done by a mobile service and the podiatrist office. They are now trying to arrange I think an angiogram however this would be in [facility]. He asked my opinion on this and I told him that angiograms are done by several different doctor groups locally and if he is from [place] I cannot see a reason to go out of [facility] for any procedure he might need. We will see if we can get the actual angiogram report and I will refer him as needed to either vein and vascular or interventional cardiology 2/12; back for his first obligatory total contact cast change. I did get his arterial studies from [facility]. Biphasic waveforms ABI in the right of 1.16 on the left at 1.14 I am not really sure what was so concerning about this [place] I will simply repeat these studies along with TBI's locally. There should not be any need for him to go out of town to have this evaluation. I am not sure that there is a clinical need Objective Constitutional Patient is hypertensive.. Pulse regular and within target range for patient.Marland Kitchen Respirations regular, non-labored and within target range.. Temperature is normal and within the target range for the patient.Marland Kitchen Appears in no distress. Vitals Time Taken: 2:48 PM, Height: 78 in, Weight: 285 lbs, BMI: 32.9, Temperature: 98.2 F, Pulse: 70 bpm, Respiratory Rate: 18 breaths/min, Blood Pressure: 156/72 mmHg. General Notes: Wound exam; patient's wound is on the right plantar great toe. Not much different from 2 days ago. No overt infection. His pedal pulses both dorsalis pedis and posterior tibial pulses are palpable Integumentary (Hair, Skin) Wound #1 status is Open. Original cause of wound was Gradually Appeared.  The wound is located on the Right Toe Great. The wound measures 1.2cm length x 0.8cm width x 0.4cm depth; 0.754cm^2 area and 0.302cm^3 volume. There is Fat Layer (Subcutaneous Tissue) Exposed exposed. There is no tunneling or undermining noted. There is a small amount of serosanguineous drainage noted. The wound margin is thickened. There is large (67-100%) pink granulation within the wound bed. There is a small (1-33%) amount of necrotic tissue within the wound bed including Adherent Slough. 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 There was a Total Contact Cast Procedure by Maxwell Caul., MD. Post procedure Diagnosis Wound #1: Same as Pre-Procedure Plan Follow-up Appointments: Return Appointment in 1 week. - MD visit Dressing Change Frequency: Wound #1 Right Toe Great: Do not change entire dressing for one week. Wound Cleansing: Wound #1 Right Toe Great: May shower with protection. - cast protector Primary Wound Dressing: Wound #1 Right Toe Great: Calcium Alginate with Silver Secondary Dressing: Wound #1 Right Toe Great: Foam - foam donut Dry Gauze Off-Loading: Total Contact Cast to Right Lower Extremity Services and Therapies ordered were: Arterial Studies- Bilateral with TBIs and  arterial dopplers - Non healing ulcer on right great toe. ABI's, TBI's, and Arterial Dopplers to bilateral lower legs. Dr. Gwenlyn Found 1. Still using silver alginate 2. We are using a foam doughnut over the great toe 3. Total contact cast replaced although we did use a #3 cast not not a #4 I think  the issue is he has a long foot size #14 but wasting of the muscle in his lower leg because of previous foot drop. Electronic Signature(s) Signed: 08/03/2019 5:45:04 PM By: Linton Ham MD Entered By: Linton Ham on 08/03/2019 15:30:26 -------------------------------------------------------------------------------- Total Contact Cast Details Patient Name: Date of Service: ARAM, DOMZALSKI 08/03/2019 2:45 PM Medical Record [id number] Patient Account Number: [id number] Date of Birth/Sex: Treating RN: [birth year] (59 y.o. Marvis Repress Primary Care Provider: PATIENT, NO Other Clinician: Referring Provider: Treating Provider/Extender:Shalane Florendo, Esperanza Richters in Treatment: 3 Total Contact Cast Applied for Wound Assessment: Wound #1 Right Toe Great Performed By: Physician Ricard Dillon., MD Post Procedure Diagnosis Same as Pre-procedure Electronic Signature(s) Signed: 08/03/2019 5:45:04 PM By: Linton Ham MD Entered By: Linton Ham on 08/03/2019 15:26:21 -------------------------------------------------------------------------------- SuperBill Details Patient Name: Date of Service: DEMERE, DOTZLER 08/03/2019 Medical Record [id number] Patient Account Number: [id number] Date of Birth/Sex: Treating RN: Aug 22, 1960 (59 y.o. 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

## 2019-08-06 NOTE — Progress Notes (Signed)
OSHEN, WLODARCZYK ([id number]) Visit Report for 08/03/2019 Arrival Information Details Patient Name: Date of Service: KEYMARION, BEARMAN 08/03/2019 2:45 PM Medical Record [id number] Patient Account Number: [id number] Date of Birth/Sex: Treating RN: 07/03/1960 (59 y.o. [name] Primary Care Michaela Shankel: PATIENT, NO Other Clinician: Referring Othello Sgroi: Treating Hallie Ertl/Extender:Robson, [name] in Treatment: 3 Visit Information History Since Last Visit Added or deleted any medications: No Patient Arrived: Ambulatory Any new allergies or adverse reactions: No Arrival Time: 14:48 Had a fall or experienced change in No Accompanied By: alone activities of daily living that may affect Transfer Assistance: None risk of falls: Patient Identification Verified: Yes Signs or symptoms of abuse/neglect since No Secondary Verification Process Yes last visito Completed: Hospitalized since last visit: No Patient Requires Transmission- No Implantable device outside of the clinic No Based Precautions: excluding Patient Has Alerts: Yes cellular tissue based products placed in Patient Alerts: Patient on Blood the center Thinner since last visit: Has Dressing in Place as Prescribed: Yes Has Footwear/Offloading in Place as Yes Prescribed: Right: Total Contact Cast Pain Present Now: No Electronic Signature(s) Signed: 08/06/2019 6:14:31 PM By: Zandra Abts RN, BSN Entered By: Zandra Abts on 08/03/2019 14:48:31 -------------------------------------------------------------------------------- Lower Extremity Assessment Details Patient Name: Date of Service: VANDERBILT, RANIERI 08/03/2019 2:45 PM Medical Record [id number] Patient Account Number: [id number] Date of Birth/Sex: Treating RN: [birth year] (59 y.o. [name] Primary Care Demia Viera: PATIENT, NO Other Clinician: Referring Dulcie Gammon: Treating Emonni Depasquale/Extender:Robson, [name] in Treatment: 3 Edema  Assessment Assessed: [Left: No] [Right: No] Edema: [Left: N] [Right: o] Calf Left: Right: Point of Measurement: 38 cm From Medial Instep cm 44 cm Ankle Left: Right: Point of Measurement: 10 cm From Medial Instep cm 24 cm Electronic Signature(s) Signed: 08/06/2019 6:12:41 PM By: Cherylin Mylar Entered By: Cherylin Mylar on 08/03/2019 15:23:18 -------------------------------------------------------------------------------- Multi Wound Chart Details Patient Name: Date of Service: MECCA, BARGA 08/03/2019 2:45 PM Medical Record [id number] Patient Account Number: [id number] Date of Birth/Sex: Treating RN: 09/05/1960 (59 y.o. [name] Primary Care Marirose Deveney: PATIENT, NO Other Clinician: Referring Corion Sherrod: Treating Iolani Twilley/Extender:Robson, [name] in Treatment: 3 Vital Signs Height(in): 78 Pulse(bpm): 70 Weight(lbs): 285 Blood Pressure(mmHg): 156/72 Body Mass Index(BMI): 33 Temperature(F): 98.2 Respiratory 18 Rate(breaths/min): Photos: [1:No Photos] [N/A:N/A] Wound Location: [1:Right Toe Great] [N/A:N/A] Wounding Event: [1:Gradually Appeared] [N/A:N/A] Primary Etiology: [1:Diabetic Wound/Ulcer of the N/A Lower Extremity] Comorbid History: [1:Pneumothorax, Deep Vein N/A Thrombosis, Type II Diabetes, Neuropathy] Date Acquired: [1:01/20/2019] [N/A:N/A] Weeks of Treatment: [1:3] [N/A:N/A] Wound Status: [1:Open] [N/A:N/A] Measurements L x W x D 1.2x0.8x0.4 [N/A:N/A] (cm) Area (cm) : [1:0.754] [N/A:N/A] Volume (cm) : [1:0.302] [N/A:N/A] % Reduction in Area: [1:-60.10%] [N/A:N/A] % Reduction in Volume: [1:-28.00%] [N/A:N/A] Classification: [1:Grade 1] [N/A:N/A] Exudate Amount: [1:Small] [N/A:N/A] Exudate Type: [1:Serosanguineous] [N/A:N/A] Exudate Color: [1:red, brown] [N/A:N/A] Wound Margin: [1:Thickened] [N/A:N/A] Granulation Amount: [1:Large (67-100%)] [N/A:N/A] Granulation Quality: [1:Pink] [N/A:N/A] Necrotic Amount: [1:Small (1-33%)]  [N/A:N/A] Exposed Structures: [1:Fat Layer (Subcutaneous Tissue) Exposed: Yes Fascia: No Tendon: No Muscle: No Joint: No Bone: No] [N/A:N/A] Epithelialization: [1:None Total Contact Cast] [N/A:N/A N/A] Treatment Notes Electronic Signature(s) Signed: 08/03/2019 5:45:04 PM By: Baltazar Najjar MD Signed: 08/06/2019 6:12:41 PM By: Cherylin Mylar Entered By: Baltazar Najjar on 08/03/2019 15:26:04 -------------------------------------------------------------------------------- Multi-Disciplinary Care Plan Details Patient Name: Date of Service: HAMPTON, WIXOM 08/03/2019 2:45 PM Medical Record [id number] Patient Account Number: [id number] Date of Birth/Sex: Treating RN: Sep 13, 1960 (59 y.o. [name] Primary Care Ashly Goethe: PATIENT, NO Other Clinician: Referring Glendal Cassaday: Treating Daysean Tinkham/Extender:Robson, [name] in Treatment: 3 Active Inactive Nutrition Nursing Diagnoses: Impaired glucose control:  actual or potential Potential for alteratiion in Nutrition/Potential for imbalanced nutrition Goals: Patient/caregiver agrees to and verbalizes understanding of need to use nutritional supplements and/or vitamins as prescribed Date Initiated: 07/09/2019 Target Resolution Date: 08/10/2019 Goal Status: Active Patient/caregiver will maintain therapeutic glucose control Date Initiated: 07/09/2019 Target Resolution Date: 08/10/2019 Goal Status: Active Interventions: Assess HgA1c results as ordered upon admission and as needed Assess patient nutrition upon admission and as needed per policy Provide education on elevated blood sugars and impact on wound healing Provide education on nutrition Treatment Activities: Education provided on Nutrition : 07/31/2019 Notes: Wound/Skin Impairment Nursing Diagnoses: Impaired tissue integrity Knowledge deficit related to ulceration/compromised skin integrity Goals: Patient/caregiver will verbalize understanding of skin care regimen Date  Initiated: 07/09/2019 Target Resolution Date: 08/10/2019 Goal Status: Active Interventions: Assess patient/caregiver ability to obtain necessary supplies Assess patient/caregiver ability to perform ulcer/skin care regimen upon admission and as needed Assess ulceration(s) every visit Provide education on ulcer and skin care Notes: Electronic Signature(s) Signed: 08/06/2019 6:12:41 PM By: Cherylin Mylar Entered By: Cherylin Mylar on 08/03/2019 14:55:50 -------------------------------------------------------------------------------- Pain Assessment Details Patient Name: Date of Service: GAYNOR, FERRERAS 08/03/2019 2:45 PM Medical Record [id number] Patient Account Number: [id number] Date of Birth/Sex: Treating RN: Sep 14, 1960 (59 y.o. [name] Primary Care Naeemah Jasmer: PATIENT, NO Other Clinician: Referring Kharee Lesesne: Treating Zakkiyya Barno/Extender:Robson, [name] in Treatment: 3 Active Problems Location of Pain Severity and Description of Pain Patient Has Paino No Site Locations Pain Management and Medication Current Pain Management: Electronic Signature(s) Signed: 08/06/2019 6:12:41 PM By: Cherylin Mylar Entered By: Cherylin Mylar on 08/03/2019 15:23:09 -------------------------------------------------------------------------------- Patient/Caregiver Education Details Patient Name: Date of Service: BROK, STOCKING 2/12/2021andnbsp2:45 PM Medical Record [id number] Patient Account Number: [id number] Date of Birth/Gender: 1960-12-26 (59 y.o. M) Treating RN: Cherylin Mylar Primary Care Physician: PATIENT, NO Other Clinician: Referring Physician: Treating Physician/Extender:Robson, [name] in Treatment: 3 Education Assessment Education Provided To: Patient Education Topics Provided Elevated Blood Sugar/ Impact on Healing: Methods: Explain/Verbal Responses: State content correctly Nutrition: Methods: Explain/Verbal Responses: State content  correctly Wound/Skin Impairment: Methods: Explain/Verbal Responses: State content correctly Electronic Signature(s) Signed: 08/06/2019 6:12:41 PM By: Cherylin Mylar Entered By: Cherylin Mylar on 08/03/2019 14:57:26 -------------------------------------------------------------------------------- Wound Assessment Details Patient Name: Date of Service: LEON, MONTOYA 08/03/2019 2:45 PM Medical Record [id number] Patient Account Number: [id number] Date of Birth/Sex: Treating RN: 1961-03-21 (59 y.o. [name] Primary Care Bobby Ragan: PATIENT, NO Other Clinician: Referring Malay Fantroy: Treating Jeffrie Lofstrom/Extender:Robson, [name] in Treatment: 3 Wound Status Wound Number: 1 Primary Diabetic Wound/Ulcer of the Lower Etiology: Extremity Wound Location: Right Toe Great Wound Open Wounding Event: Gradually Appeared Status: Date Acquired: 01/20/2019 Comorbid Pneumothorax, Deep Vein Thrombosis, Type Weeks Of Treatment: 3 History: II Diabetes, Neuropathy Clustered Wound: No Wound Measurements Length: (cm) 1.2 Width: (cm) 0.8 Depth: (cm) 0.4 Area: (cm) 0.754 Volume: (cm) 0.302 Wound Description Classification: Grade 1 Wound Margin: Thickened Exudate Amount: Small Exudate Type: Serosanguineous Exudate Color: red, brown Wound Bed Granulation Amount: Large (67-100%) Granulation Quality: Pink Necrotic Amount: Small (1-33%) Necrotic Quality: Adherent Slough fter Cleansing: No ino No Exposed Structure sed: No Subcutaneous Tissue) Exposed: Yes sed: No sed: No ed: No d: No % Reduction in Area: -60.1% % Reduction in Volume: -28% Epithelialization: None Tunneling: No Undermining: No Foul Odor A Slough/Fibr Fascia Expo Fat Layer ( Tendon Expo Muscle Expo Joint Expos Bone Expose Electronic Signature(s) Signed: 08/06/2019 6:12:41 PM By: Cherylin Mylar Entered By: Cherylin Mylar on 08/03/2019  15:24:07 -------------------------------------------------------------------------------- Vitals Details Patient Name: Date of Service: Kizzie Furnish 08/03/2019 2:45 PM Medical Record  [id number] Patient Account Number: [id number] Date of Birth/Sex: Treating RN: [birth year] (59 y.o. [name] Primary Care Yolani Vo: PATIENT, NO Other Clinician: Referring Saskia Simerson: Treating Edee Nifong/Extender:Robson, Esperanza Richters in Treatment: 3 Vital Signs Time Taken: 14:48 Temperature (F): 98.2 Height (in): 78 Pulse (bpm): 70 Weight (lbs): 285 Respiratory Rate (breaths/min): 18 Body Mass Index (BMI): 32.9 Blood Pressure (mmHg): 156/72 Reference Range: 80 - 120 mg / dl Electronic Signature(s) Signed: 08/06/2019 6:14:31 PM By: Levan Hurst RN, BSN Entered By: Levan Hurst on 08/03/2019 14:48:51

## 2019-08-07 ENCOUNTER — Other Ambulatory Visit: Payer: Self-pay

## 2019-08-07 ENCOUNTER — Encounter (HOSPITAL_BASED_OUTPATIENT_CLINIC_OR_DEPARTMENT_OTHER): Payer: 59 | Admitting: Internal Medicine

## 2019-08-07 DIAGNOSIS — L97512 Non-pressure chronic ulcer of other part of right foot with fat layer exposed: Secondary | ICD-10-CM | POA: Diagnosis not present

## 2019-08-08 NOTE — Progress Notes (Signed)
DRAXTON, LUU ([id number]) Visit Report for 08/07/2019 Arrival Information Details Patient Name: Date of Service: SALLIE, MAKER 08/07/2019 8:30 AM Medical Record [id number] Patient Account Number: [id number] Date of Birth/Sex: Treating RN: 1960-12-08 (59 y.o. [name] Primary Care Tanairi Cypert: PATIENT, NO Other Clinician: Referring Rudi Bunyard: Treating Karizma Cheek/Extender:Robson, [name] in Treatment: 4 Visit Information History Since Last Visit Added or deleted any medications: No Patient Arrived: Ambulatory Any new allergies or adverse reactions: No Arrival Time: 08:52 Had a fall or experienced change in No Accompanied By: self activities of daily living that may affect Transfer Assistance: None risk of falls: Patient Identification Verified: Yes Signs or symptoms of abuse/neglect since No Secondary Verification Process Yes last visito Completed: Hospitalized since last visit: No Patient Requires Transmission- No Implantable device outside of the clinic No Based Precautions: excluding Patient Has Alerts: Yes cellular tissue based products placed in Patient Alerts: Patient on Blood the center Thinner since last visit: Has Dressing in Place as Prescribed: Yes Has Footwear/Offloading in Place as Yes Prescribed: Right: Total Contact Cast Pain Present Now: Yes Electronic Signature(s) Signed: 08/07/2019 11:16:04 AM By: Zenaida Deed RN, BSN Entered By: Zenaida Deed on 08/07/2019 08:59:01 -------------------------------------------------------------------------------- Encounter Discharge Information Details Patient Name: Date of Service: CHRISTEN, BEDOYA 08/07/2019 8:30 AM Medical Record [id number] Patient Account Number: [id number] Date of Birth/Sex: Treating RN: Jun 30, 1960 (59 y.o. [name] Primary Care Mittie Knittel: PATIENT, NO Other Clinician: Referring Triston Lisanti: Treating Nzinga Ferran/Extender:Robson, [name] in Treatment:  4 Encounter Discharge Information Items Post Procedure Vitals Discharge Condition: Stable Temperature (F): 98.4 Ambulatory Status: Ambulatory Pulse (bpm): 73 Discharge Destination: Home Respiratory Rate (breaths/min): 18 Transportation: Private Auto Blood Pressure (mmHg): 129/96 Accompanied By: alone Schedule Follow-up Appointment: Yes Clinical Summary of Care: Patient Declined Electronic Signature(s) Signed: 08/07/2019 5:22:24 PM By: Cherylin Mylar Entered By: Cherylin Mylar on 08/07/2019 10:00:37 -------------------------------------------------------------------------------- Lower Extremity Assessment Details Patient Name: Date of Service: JAYMIN, WALN 08/07/2019 8:30 AM Medical Record [id number] Patient Account Number: [id number] Date of Birth/Sex: Treating RN: 07/30/1960 (59 y.o. [name] Primary Care Shaheim Mahar: PATIENT, NO Other Clinician: Referring Marvetta Vohs: Treating Harvard Zeiss/Extender:Robson, [name] in Treatment: 4 Edema Assessment Assessed: [Left: No] [Right: No] Edema: [Left: N] [Right: o] Calf Left: Right: Point of Measurement: 38 cm From Medial Instep cm 43.5 cm Ankle Left: Right: Point of Measurement: 10 cm From Medial Instep cm 24.6 cm Vascular Assessment Pulses: Dorsalis Pedis Palpable: [Right:Yes] Electronic Signature(s) Signed: 08/07/2019 11:16:04 AM By: Zenaida Deed RN, BSN Entered By: Zenaida Deed on 08/07/2019 09:12:59 -------------------------------------------------------------------------------- Multi Wound Chart Details Patient Name: Date of Service: Kizzie Furnish 08/07/2019 8:30 AM Medical Record [id number] Patient Account Number: [id number] Date of Birth/Sex: Treating RN: [birth year] (58 y.o. [name]) Yevonne Pax Primary Care Hibah Odonnell: PATIENT, NO Other Clinician: Referring Wilfred Dayrit: Treating Telina Kleckley/Extender:Robson, [name] in Treatment: 4 Vital Signs Height(in): 78 Pulse(bpm): 73 Weight(lbs):  285 Blood Pressure(mmHg): 129/96 Body Mass Index(BMI): 33 Temperature(F): 98.4 Respiratory 18 Rate(breaths/min): Photos: [1:No Photos] [N/A:N/A] Wound Location: [1:Right Toe Great] [N/A:N/A] Wounding Event: [1:Gradually Appeared] [N/A:N/A] Primary Etiology: [1:Diabetic Wound/Ulcer of the N/A Lower Extremity] Comorbid History: [1:Pneumothorax, Deep Vein N/A Thrombosis, Type II Diabetes, Neuropathy] Date Acquired: [1:01/20/2019] [N/A:N/A] Weeks of Treatment: [1:4] [N/A:N/A] Wound Status: [1:Open] [N/A:N/A] Measurements L x W x D 1.1x0.8x0.3 [N/A:N/A] (cm) Area (cm) : [1:0.691] [N/A:N/A] Volume (cm) : [1:0.207] [N/A:N/A] % Reduction in Area: [1:-46.70%] [N/A:N/A] % Reduction in Volume: 12.30% [N/A:N/A] Classification: [1:Grade 1] [N/A:N/A] Exudate Amount: [1:Small] [N/A:N/A] Exudate Type: [1:Serosanguineous] [N/A:N/A] Exudate Color: [1:red, brown] [N/A:N/A] Wound Margin: [1:Thickened] [N/A:N/A] Granulation Amount: [1:Large (  67-100%)] [N/A:N/A] Granulation Quality: [1:Red, Friable] [N/A:N/A] Necrotic Amount: [1:None Present (0%)] [N/A:N/A] Exposed Structures: [1:Fat Layer (Subcutaneous N/A Tissue) Exposed: Yes Fascia: No Tendon: No Muscle: No Joint: No Bone: No] Epithelialization: [1:None] [N/A:N/A] Debridement: [1:Debridement - Excisional N/A] Pre-procedure [1:09:30] [N/A:N/A] Verification/Time Out Taken: Pain Control: [1:Lidocaine 5% topical ointment] Tissue Debrided: [1:Callus, Subcutaneous, Slough] Level: [1:Skin/Subcutaneous Tissue] Debridement Area (sq cm):0.88 Instrument: [1:Curette] Bleeding: [1:Moderate] Hemostasis Achieved: [1:Pressure] Procedural Pain: [1:0] Post Procedural Pain: [1:0] Debridement Treatment Procedure was tolerated Response: [1:well] Post Debridement [1:1.1x0.8x0.3] Measurements L x W x D (cm) Post Debridement [1:0.207] Volume: (cm) Procedures Performed: Debridement Treatment Notes Electronic Signature(s) Signed: 08/07/2019 5:18:57 PM  By: Baltazar Najjar MD Signed: 08/08/2019 5:45:19 PM By: Yevonne Pax RN Entered By: Baltazar Najjar on 08/07/2019 09:38:58 -------------------------------------------------------------------------------- Multi-Disciplinary Care Plan Details Patient Name: Date of Service: RAYMUND, MANRIQUE 08/07/2019 8:30 AM Medical Record [id number] Patient Account Number: [id number] Date of Birth/Sex: Treating RN: May 31, 1961 (58 y.o. [name]) Jettie Pagan, Lyla Son Primary Care Calia Napp: PATIENT, NO Other Clinician: Referring Roarke Marciano: Treating Branton Einstein/Extender:Robson, [name] in Treatment: 4 Active Inactive Nutrition Nursing Diagnoses: Impaired glucose control: actual or potential Potential for alteratiion in Nutrition/Potential for imbalanced nutrition Goals: Patient/caregiver agrees to and verbalizes understanding of need to use nutritional supplements and/or vitamins as prescribed Date Initiated: 07/09/2019 Target Resolution Date: 08/10/2019 Goal Status: Active Patient/caregiver will maintain therapeutic glucose control Date Initiated: 07/09/2019 Target Resolution Date: 08/10/2019 Goal Status: Active Interventions: Assess HgA1c results as ordered upon admission and as needed Assess patient nutrition upon admission and as needed per policy Provide education on elevated blood sugars and impact on wound healing Provide education on nutrition Treatment Activities: Education provided on Nutrition : 08/03/2019 Notes: Wound/Skin Impairment Nursing Diagnoses: Impaired tissue integrity Knowledge deficit related to ulceration/compromised skin integrity Goals: Patient/caregiver will verbalize understanding of skin care regimen Date Initiated: 07/09/2019 Target Resolution Date: 08/10/2019 Goal Status: Active Interventions: Assess patient/caregiver ability to obtain necessary supplies Assess patient/caregiver ability to perform ulcer/skin care regimen upon admission and as needed Assess ulceration(s) every  visit Provide education on ulcer and skin care Notes: Electronic Signature(s) Signed: 08/08/2019 5:45:19 PM By: Yevonne Pax RN Entered By: Yevonne Pax on 08/07/2019 08:19:17 -------------------------------------------------------------------------------- Pain Assessment Details Patient Name: Date of Service: IVERSON, SEES 08/07/2019 8:30 AM Medical Record [id number] Patient Account Number: [id number] Date of Birth/Sex: Treating RN: 1961/01/17 (59 y.o. [name] Primary Care Makiyah Zentz: PATIENT, NO Other Clinician: Referring Shivaun Bilello: Treating Phu Record/Extender:Robson, [name] in Treatment: 4 Active Problems Location of Pain Severity and Description of Pain Patient Has Paino Yes Site Locations Pain Location: Generalized Pain Rate the pain. Current Pain Level: 3 Character of Pain Describe the Pain: Aching Pain Management and Medication Current Pain Management: Other: time, reposition Is the Current Pain Management Adequate Adequate: How does your wound impact your activities of daily livingo Sleep: Yes Bathing: No Appetite: No Relationship With Others: No Bladder Continence: No Emotions: No Bowel Continence: No Work: No Toileting: No Drive: No Dressing: No Hobbies: No Notes c/o discomfort of cast rubbing on lateral calf Electronic Signature(s) Signed: 08/07/2019 11:16:04 AM By: Zenaida Deed RN, BSN Entered By: Zenaida Deed on 08/07/2019 09:18:57 -------------------------------------------------------------------------------- Patient/Caregiver Education Details Patient Name: Date of Service: KOBI, MARIO 2/16/2021andnbsp8:30 AM Medical Record [id number] Patient Account Number: [id number] Date of Birth/Gender: 1961-05-25 (58 y.o. M) Treating RN: Yevonne Pax Primary Care Physician: PATIENT, NO Other Clinician: Referring Physician: Treating Physician/Extender:Robson, [name] in Treatment: 4 Education Assessment Education  Provided To: Patient Education Topics Provided Wound/Skin Impairment: Methods: Explain/Verbal Responses: State content correctly  Electronic Signature(s) Signed: 08/08/2019 5:45:19 PM By: Carlene Coria RN Entered By: Carlene Coria on 08/07/2019 08:19:30 -------------------------------------------------------------------------------- Wound Assessment Details Patient Name: Date of Service: JAIZON, DEROOS 08/07/2019 8:30 AM Medical Record [id number] Patient Account Number: [id number] Date of Birth/Sex: Treating RN: Apr 04, 1961 (58 y.o. [name]) Carlene Coria Primary Care Kasen Sako: PATIENT, NO Other Clinician: Referring Chermaine Schnyder: Treating Preet Perrier/Extender:Robson, Esperanza Richters in Treatment: 4 Wound Status Wound Number: 1 Primary Diabetic Wound/Ulcer of the Lower Etiology: Extremity Wound Location: Right Toe Great Wound Open Wounding Event: Gradually Appeared Status: Date Acquired: 01/20/2019 Comorbid Pneumothorax, Deep Vein Thrombosis, Type Weeks Of Treatment: 4 History: II Diabetes, Neuropathy Clustered Wound: No Photos Wound Measurements Length: (cm) 1.1 Width: (cm) 0.8 Depth: (cm) 0.3 Area: (cm) 0.691 Volume: (cm) 0.207 Wound Description Classification: Grade 1 Wound Margin: Thickened Exudate Amount: Small Exudate Type: Serosanguineous Exudate Color: red, brown Wound Bed Granulation Amount: Large (67-100%) Granulation Quality: Red, Friable Necrotic Amount: None Present (0%) Foul Odor After Cleansing: N Slough/Fibrino Y Exposed Structure Fascia Exposed: No Fat Layer (Subcutaneous Tissue) Exposed: Ye Tendon Exposed: No Muscle Exposed: No Joint Exposed: No Bone Exposed: No % Reduction in Area: -46.7% % Reduction in Volume: 12.3% Epithelialization: None Tunneling: No Undermining: No o es s Treatment Notes Wound #1 (Right Toe Great) 1. Cleanse With Wound Cleanser Soap and water 2. Periwound Care Skin Prep 3. Primary Dressing Applied Calcium Alginate Ag 4.  Secondary Dressing Dry Gauze Roll Gauze 5. Secured With Recruitment consultant) Signed: 08/07/2019 4:25:10 PM By: Mikeal Hawthorne EMT/HBOT Signed: 08/08/2019 5:45:19 PM By: Carlene Coria RN Entered By: Mikeal Hawthorne on 08/07/2019 15:07:19 -------------------------------------------------------------------------------- Vitals Details Patient Name: Date of Service: FINTAN, GRATER 08/07/2019 8:30 AM Medical Record [id number] Patient Account Number: [id number] Date of Birth/Sex: Treating RN: 1960/10/09 (59 y.o. Ernestene Mention Primary Care Chistopher Mangino: PATIENT, NO Other Clinician: Referring Findley Blankenbaker: Treating Jeneen Doutt/Extender:Robson, Esperanza Richters in Treatment: 4 Vital Signs Time Taken: 08:59 Temperature (F): 98.4 Height (in): 78 Pulse (bpm): 73 Source: Stated Respiratory Rate (breaths/min): 18 Weight (lbs): 285 Blood Pressure (mmHg): 129/96 Source: Stated Reference Range: 80 - 120 mg / dl Body Mass Index (BMI): 32.9 Electronic Signature(s) Signed: 08/07/2019 11:16:04 AM By: Baruch Gouty RN, BSN Entered By: Baruch Gouty on 08/07/2019 09:18:39

## 2019-08-08 NOTE — Progress Notes (Signed)
KHAZA, BLANSETT ([id number]) Visit Report for 08/07/2019 Debridement Details Patient Name: Date of Service: RAIDON, SWANNER 08/07/2019 8:30 AM Medical Record [id number] Patient Account Number: [id number] Date of Birth/Sex: Treating RN: 02-Dec-1960 (58 y.o. [name]) Yevonne Pax Primary Care Provider: PATIENT, NO Other Clinician: Referring Provider: Treating Provider/Extender:Jakari Jacot, [name] in Treatment: 4 Debridement Performed for Wound #1 Right Toe Great Assessment: Performed By: Physician Maxwell Caul., MD Debridement Type: Debridement Severity of Tissue Pre Fat layer exposed Debridement: Level of Consciousness (Pre- Awake and Alert procedure): Pre-procedure Verification/Time Out Taken: Yes - 09:30 Start Time: 09:30 Pain Control: Lidocaine 5% topical ointment Total Area Debrided (L x W): 1.1 (cm) x 0.8 (cm) = 0.88 (cm) Tissue and other material Viable, Non-Viable, Callus, Slough, Subcutaneous, Skin: Dermis , Skin: Epidermis, debrided: Slough Level: Skin/Subcutaneous Tissue Debridement Description: Excisional Instrument: Curette Bleeding: Moderate Hemostasis Achieved: Pressure End Time: 09:32 Procedural Pain: 0 Post Procedural Pain: 0 Response to Treatment: Procedure was tolerated well Level of Consciousness Awake and Alert (Post-procedure): Post Debridement Measurements of Total Wound Length: (cm) 1.1 Width: (cm) 0.8 Depth: (cm) 0.3 Volume: (cm) 0.207 Character of Wound/Ulcer Post Improved Debridement: Severity of Tissue Post Debridement: Fat layer exposed Post Procedure Diagnosis Same as Pre-procedure Electronic Signature(s) Signed: 08/07/2019 5:18:57 PM By: Baltazar Najjar MD Signed: 08/08/2019 5:45:19 PM By: Yevonne Pax RN Entered By: Baltazar Najjar on 08/07/2019 09:41:36 -------------------------------------------------------------------------------- HPI Details Patient Name: Date of Service: NECHEMIA, CHIAPPETTA 08/07/2019 8:30 AM Medical Record  [id number] Patient Account Number: [id number] Date of Birth/Sex: Treating RN: [birth year] (58 y.o. [name]) Yevonne Pax Primary Care Provider: PATIENT, NO Other Clinician: Referring Provider: Treating Provider/Extender:Whitt Auletta, [name] in Treatment: 4 History of Present Illness HPI Description: ADMISSION 07/09/2019 This is a pleasant 59 year old man who referred himself here for second opinion sign a new area on the right plantar first toe and the dorsal part of his right fifth toe. He says he had these in August when he had removed callus from the first toe and then played pool volleyball for about 4 hours. He thinks the bottom of the pool simply caused an abrasion of the toe. The story sounds accurate. He has been going to podiatry for the last several months. Me a picture on his phone from August and the wound is gotten considerably smaller. He states that it started doing well recently when they started collagen. He has been using a surgical shoe to offload. 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 He had a brace for a long time but now he simply lifts his foot higher to clear but states that most people would not even notice that he had any disability. He is a type 2 diabetes Badik but he has diet-controlled he has lost weight by watching calories. 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 07/16/2019; right fifth toe is closed. Right first toe is smaller. He is using a surgical shoe but he tells me is fairly religious about using his scooter at home. Hopefully this will give him enough offloading to close this. 2/1; right fifth toe dorsally remains closed. Right first toe still not a lot  of improvement. Although superficially it looks smaller there is undermining laterally from 6-6 of at least  3 to 4 mm. Also worrisome is that the granulation does not look that healthy. We have been using silver collagen He had his foot x-rayed by his podiatrist but I do not have access to this. I am going to x-ray the right great toe again. Otherwise I am thinking he is going to require a total contact cast probably next week and I discussed this with him today. 2/9; his x-ray of the right foot did not show plain x-ray evidence of osteomyelitis. We have been using silver alginate the wound is not improved looks static. He is going to get a total contact cast today He comes in today telling me that before he left podiatry before he came here he had noninvasive arterial studies that were done by a mobile service and the podiatrist office. They are now trying to arrange I think an angiogram however [facility]. He asked my opinion on this and I told him that angiograms are done by several different doctor groups locally and if he is from [place] I cannot see a reason to [facility] out [facility] he might need. We will see if we can get the actual angiogram report and I will refer him as needed to either vein and vascular or interventional cardiology 2/12; back for his first obligatory total contact cast change. I did get his arterial studies from [facility]. Biphasic waveforms ABI in the right of 1.16 on the left at 1.14 I am not really sure what was so concerning about this [place] I will simply repeat these studies along with TBI's locally. There should not be any need for him to go out of town to have this evaluation. I am not sure that there is a clinical need 2/16; once again the total contact cast was too loose he has multiple skin excoriations where things were rubbing. He claims  to not be on this all that much and is working from home but between the size of his foot the wasting in the distal part of his lower leg and large calfs we just cannot mold the cast in to fit his leg properly.-Changed him to a forefoot off loader but with his foot drop that may not be possible. Electronic Signature(s) Signed: 08/07/2019 5:18:57 PM By: Baltazar Najjarobson, Prosper Paff MD Entered By: Baltazar Najjarobson, Gurfateh Mcclain on 08/07/2019 09:41:14 -------------------------------------------------------------------------------- Physical Exam Details Patient Name: Date of Service: Kizzie FurnishROSOL, Travas 08/07/2019 8:30 AM Medical Record [id number] Patient Account Number: [id number] Date of Birth/Sex: Treating RN: 03/06/1961 (58 y.o. Judie PetitM) Yevonne PaxEpps, Carrie Primary Care Provider: PATIENT, NO Other Clinician: Referring Provider: Treating Provider/Extender:Jamyria Ozanich, Lamar SprinklesMichael Weeks in Treatment: 4 Constitutional Patient is hypertensive.. Pulse regular and within target range for patient.Marland Kitchen. Respirations regular, non-labored and within target range.. Temperature is normal and within the target range for the patient.Marland Kitchen. Appears in no distress. Notes Wound exam Wound is on the right plantar great toe. Surface looks better dimensions somewhat better. Healthy granulation. Using a #5 curette I took down skin and some cutaneous tissue from around the wound margins hemostasis with direct pressure. I am hopeful that this will give clean edges for epithelialization Electronic Signature(s) Signed: 08/07/2019 5:18:57 PM By: Baltazar Najjarobson, Delma Villalva MD Entered By: Baltazar Najjarobson, Larin Weissberg on 08/07/2019 09:42:41 -------------------------------------------------------------------------------- Physician Orders Details Patient Name: Date of Service: Kizzie FurnishROSOL, Cederick 08/07/2019 8:30 AM Medical Record [id number] Patient Account Number: [id number] Date of Birth/Sex: Treating RN: 12/28/1960 (58 y.o.  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 - MD visit Dressing Change Frequency Wound #1 Right Toe Great Change dressing every day. Wound Cleansing Wound #1 Right Toe Great May shower and wash wound with soap and water. Primary Wound Dressing Wound #1 Right Toe Great Calcium Alginate with Silver Secondary Dressing Wound #1 Right Toe Great Kerlix/Rolled Gauze - secure with tape Dry Gauze Off-Loading Other: - forefoot off loader Electronic Signature(s) Signed: 08/07/2019 5:18:57 PM By: Baltazar Najjar MD Signed: 08/08/2019 5:45:19 PM By: Yevonne Pax RN Entered By: Yevonne Pax on 08/07/2019 09:36:15 -------------------------------------------------------------------------------- Problem List Details Patient Name: Date of Service: VENCIL, BASNETT 08/07/2019 8:30 AM Medical Record [id number] Patient Account Number: [id number] Date of Birth/Sex: Treating RN: June 30, 1960 (58 y.o. [name]) Yevonne Pax Primary Care Provider: PATIENT, NO Other Clinician: Referring Provider: Treating Provider/Extender:Nishita Isaacks, [name] in Treatment: 4 Active Problems ICD-10 Evaluated Encounter Code Description Active Date Today Diagnosis E11.621 Type 2 diabetes mellitus with foot ulcer 07/09/2019 No Yes L97.512 Non-pressure chronic ulcer of other part of right foot 07/09/2019 No Yes with fat layer exposed E11.42 Type 2 diabetes mellitus with diabetic polyneuropathy 07/09/2019 No Yes M21.371 Foot drop, right foot  07/09/2019 No Yes E11.51 Type 2 diabetes mellitus with diabetic peripheral 07/31/2019 No Yes angiopathy without gangrene Inactive Problems Resolved Problems Electronic Signature(s) Signed: 08/07/2019 5:18:57 PM By: Baltazar Najjar MD Entered By: Baltazar Najjar on 08/07/2019 09:38:51 -------------------------------------------------------------------------------- Progress Note Details Patient Name: Date of Service: LESS, WOOLSEY 08/07/2019 8:30 AM Medical Record [id number] Patient Account Number: [id number] Date of Birth/Sex: Treating RN: 1960/08/25 (58 y.o. [name]) Yevonne Pax Primary Care Provider: PATIENT, NO Other Clinician: Referring Provider: Treating Provider/Extender:Aspen Deterding, [name] in Treatment: 4 Subjective History of Present Illness (HPI) ADMISSION 07/09/2019 This is a pleasant 59 year old man who referred himself here for second opinion sign a new area on the right plantar first toe and the dorsal part of his right fifth toe. He says he had these in August when he had removed callus from the first toe and then played pool volleyball for about 4 hours. He thinks the bottom of the pool simply caused an abrasion of the toe. The story sounds accurate. He has been going to podiatry for the last several months. Me a picture on his phone from August and the wound is gotten considerably smaller. He states that it started doing well recently when they started collagen. He has been using a surgical shoe to offload. 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 He had a brace for a long time but now he simply lifts his foot higher to clear but states that most people would not even notice that he had any disability. He is a type 2 diabetes Badik but he has diet-controlled he has lost weight by watching calories. Past medical history  type 2 diabetes diet controlled, right foot drop is noted, right total hip replacement x2, history of PE on  Xarelto chronically ABI in our clinic was 1.03 on the right. 07/16/2019; right fifth toe is closed. Right first toe is smaller. He is using a surgical shoe but he tells me is fairly religious about using his scooter at home. Hopefully this will give him enough offloading to close this. 2/1; right fifth toe dorsally remains closed. Right first toe still not a lot of improvement. Although superficially it looks smaller there is undermining laterally from 6-6 of at least 3 to 4 mm. Also worrisome is that the granulation does not look that healthy. We have been using silver collagen He had his foot x-rayed by his podiatrist but I do not have access to this. I am going to x-ray the right great toe again. Otherwise I am thinking he is going to require a total contact cast probably next week and I discussed this with him today. 2/9; his x-ray of the right foot did not show plain x-ray evidence of osteomyelitis. We have been using silver alginate the wound is not improved looks static. He is going to get a total contact cast today He comes in today telling me that before he left podiatry before he came here he had noninvasive arterial studies that were done by a mobile service and the podiatrist office. They are now trying to arrange I think an angiogram however this would be in [facility]. He asked my opinion on this and I told him that angiograms are done by several different doctor groups locally and if he is from [place] I cannot see a reason to go out of [facility] for any procedure he might need. We will see if we can get the actual angiogram report and I will refer him as needed to either vein and vascular or interventional cardiology 2/12; back for his first obligatory total contact cast change. I did get his arterial studies from [facility]. Biphasic  waveforms ABI in the right of 1.16 on the left at 1.14 I am not really sure what was so concerning about this [place] I will simply repeat these studies along with TBI's locally. There should not be any need for him to go out of town to have this evaluation. I am not sure that there is a clinical need 2/16; once again the total contact cast was too loose he has multiple skin excoriations where things were rubbing. He claims to not be on this all that much and is working from home but between the size of his foot the wasting in the distal part of his lower leg and large calfs we just cannot mold the cast in to fit his leg properly.-Changed him to a forefoot off loader but with his foot drop that may not be possible. Objective Constitutional Patient is hypertensive.. Pulse regular and within target range for patient.Marland Kitchen Respirations regular, non-labored and within target range.. Temperature is normal and within the target range for the patient.Marland Kitchen Appears in no distress. Vitals Time Taken: 8:59 AM, Height: 78 in, Source: Stated, Weight: 285 lbs, Source: Stated, BMI: 32.9, Temperature: 98.4 F, Pulse: 73 bpm, Respiratory Rate: 18 breaths/min, Blood Pressure: 129/96 mmHg. General Notes: Wound exam ooWound is on the right plantar great toe. Surface looks better dimensions somewhat better. Healthy granulation. Using a #5 curette I took down skin and some cutaneous tissue from around the wound margins hemostasis with direct pressure. I am hopeful that this will give clean  edges for epithelialization Integumentary (Hair, Skin) Wound #1 status is Open. Original cause of wound was Gradually Appeared. The wound is located on the Right Toe Great. The wound measures 1.1cm length x 0.8cm width x 0.3cm depth; 0.691cm^2 area and 0.207cm^3 volume. There is Fat Layer (Subcutaneous Tissue) Exposed exposed. There is no tunneling or undermining noted. There is a small  amount of serosanguineous drainage noted. The wound margin is thickened. There is large (67-100%) red, friable granulation within the wound bed. There is no necrotic tissue within the wound bed. 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 There was a Excisional Skin/Subcutaneous Tissue Debridement with a total area of 0.88 sq cm performed by Ricard Dillon., MD. With the following instrument(s): Curette to remove Viable and Non-Viable tissue/material. Material removed includes Callus, Subcutaneous Tissue, Slough, Skin: Dermis, and Skin: Epidermis after achieving pain control using Lidocaine 5% topical ointment. No specimens were taken. A time out was conducted at 09:30, prior to the start of the procedure. A Moderate amount of bleeding was controlled with Pressure. The procedure was tolerated well with a pain level of 0 throughout and a pain level of 0 following the procedure. Post Debridement Measurements: 1.1cm length x 0.8cm width x 0.3cm depth; 0.207cm^3 volume. Character of Wound/Ulcer Post Debridement is improved. Severity of Tissue Post Debridement is: Fat layer exposed. Post procedure Diagnosis Wound #1: Same as Pre-Procedure Plan Follow-up Appointments: Return Appointment in 1 week. - MD visit Dressing Change Frequency: Wound #1 Right Toe Great: Change dressing every day. Wound Cleansing: Wound #1 Right Toe Great: May shower and wash wound with soap and water. Primary Wound Dressing: Wound #1 Right Toe Great: Calcium  Alginate with Silver Secondary Dressing: Wound #1 Right Toe Great: Kerlix/Rolled Gauze - secure with tape Dry Gauze Off-Loading: Other: - forefoot off loader 1. Continue with silver alginate 2. Forefoot off loader 3. I have emphasized keeping pressure off this toe to the patient which he seems to understand 4. I was reluctant to put this leg back in a cast he has a number of friction/pressure areas including the right anterior lower leg right medial and lateral lower leg and a smaller area on the posterior lower leg. I think the problem here was simply in the difference in dimensions that did not allow [place] to mold the cast properly. He also has the foot drop. Electronic Signature(s) Signed: 08/07/2019 5:18:57 PM By: Linton Ham MD Entered By: Linton Ham on 08/07/2019 09:43:55 -------------------------------------------------------------------------------- SuperBill Details Patient Name: Date of Service: MILOH, ALCOCER 08/07/2019 Medical Record [id number] Patient Account Number: [id number] Date of Birth/Sex: Treating RN: [birth year] (58 y.o. 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 M21.371 Foot drop, right foot E11.51 Type 2 diabetes mellitus with diabetic peripheral angiopathy without gangrene Facility Procedures CPT4 Code Description: [id number] 11042 - DEB SUBQ TISSUE 20  SQ CM/< ICD-10 Diagnosis Description L97.512 Non-pressure chronic ulcer of other part of right foot with Modifier: fat layer expose Quantity: 1 d Physician Procedures CPT4 Code Description: [id number] 11042 - WC PHYS SUBQ TISS 20 SQ CM ICD-10 Diagnosis  Description L97.512 Non-pressure chronic ulcer of other part of right foot with Modifier: fat layer expose Quantity: 1 d Electronic Signature(s) Signed: 08/07/2019 5:18:57 PM By: Baltazar Najjar MD Entered By: Baltazar Najjar on 08/07/2019 09:44:07

## 2019-08-14 ENCOUNTER — Other Ambulatory Visit: Payer: Self-pay

## 2019-08-14 ENCOUNTER — Encounter (HOSPITAL_BASED_OUTPATIENT_CLINIC_OR_DEPARTMENT_OTHER): Payer: 59 | Admitting: Internal Medicine

## 2019-08-14 DIAGNOSIS — L97512 Non-pressure chronic ulcer of other part of right foot with fat layer exposed: Secondary | ICD-10-CM | POA: Diagnosis not present

## 2019-08-14 NOTE — Progress Notes (Signed)
Allen, Gregory ([id number]) Visit Report for 08/14/2019 HPI Details Patient Name: Date of Service: Allen Gregory, Allen Gregory 08/14/2019 8:45 AM Medical Record [id number] Patient Account Number: [id number] Date of Birth/Sex: Treating RN: 1960-09-15 (59 y.o. Allen Gregory) Allen Gregory Primary Care Provider: PATIENT, NO Other Clinician: Referring Provider: Treating Provider/Extender:Allen Gregory, Allen Gregory in Treatment: 5 History of Present Illness HPI Description: ADMISSION 07/09/2019 This is a pleasant 59 year old man who referred himself here for second opinion sign a new area on the right plantar first toe and the dorsal part of his right fifth toe. He says he had these in August when he had removed callus from the first toe and then played pool volleyball for about 4 hours. He thinks the bottom of the pool simply caused an abrasion of the toe. The story sounds accurate. He has been going to podiatry for the last several months. Me a picture on his phone from August and the wound is gotten considerably smaller. He states that it started doing well recently when they started collagen. He has been using a surgical shoe to offload. 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 He had a brace for a long time but now he simply lifts his foot higher to clear but states that most people would not even notice that he had any disability. He is a type 2 diabetes Badik but he has diet-controlled he has lost weight by watching calories. 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 07/16/2019; right fifth toe is closed. Right first toe is smaller. He is using a surgical shoe but he tells me is fairly religious about using his scooter  at home. Hopefully this will give him enough offloading to close this. 2/1; right fifth toe dorsally remains closed. Right first toe still not a lot of improvement. Although superficially it looks smaller there is undermining laterally from 6-6 of at least 3 to 4 mm. Also worrisome is that the granulation does not look that healthy. We have been using silver collagen He had his foot x-rayed by his podiatrist but I do not have access to this. I am going to x-ray the right great toe again. Otherwise I am thinking he is going to require a total contact cast probably next week and I discussed this with him today. 2/9; his x-ray of the right foot did not show plain x-ray evidence of osteomyelitis. We have been using silver alginate the wound is not improved looks static. He is going to get a total contact cast today He comes in today telling me that before he left podiatry before he came here he had noninvasive arterial studies that were done by a mobile service and the podiatrist office. They are now trying to arrange I think an angiogram however this would be in [facility]. He asked my opinion on this and I told him that angiograms are done by several different doctor groups locally and if he is from [place] I cannot see a reason to go out of [facility] for any procedure he might need. We will see if we can get the actual angiogram report and I will refer him as needed to either vein and vascular or interventional cardiology 2/12; back for  his first obligatory total contact cast change. I did get his arterial studies from [facility]. Biphasic waveforms ABI in the right of 1.16 on the left at 1.14 I am not really sure what was so concerning about [place] I will simply repeat these studies along with TBI's locally. There should not be any need for him to go out of town to have this evaluation. I am not sure that  there is a clinical need 2/16; once again the total contact cast was too loose he has multiple skin excoriations where things were rubbing. He claims to not be on this all that much and is working from home but between the size of his foot the wasting in the distal part of his lower leg and large calfs we just cannot mold the cast in to fit his leg properly.-Changed him to a forefoot off loader but with his foot drop that may not be possible. 2/23; we put him out in a surgical shoe last week as we did not have a forefoot off loader. We have this today. We are using silver alginate. He did not tolerate a total contact cast Electronic Signature(s) Signed: 08/14/2019 6:00:34 PM By: Allen Najjar MD Entered By: Allen Gregory on 08/14/2019 09:45:11 -------------------------------------------------------------------------------- Physical Exam Details Patient Name: Date of Service: Allen, Gregory 08/14/2019 8:45 AM Medical Record [id number] Patient Account Number: [id number] Date of Birth/Sex: Treating RN: 05-01-1961 (59 y.o. Allen Gregory) Allen Gregory Primary Care Provider: PATIENT, NO Other Clinician: Referring Provider: Treating Provider/Extender:Allen Gregory, Allen Gregory in Treatment: 5 Constitutional Patient is hypertensive.. Pulse regular and within target range for patient.Marland Kitchen Respirations regular, non-labored and within target range.. Temperature is normal and within the target range for the patient.Marland Kitchen Appears in no distress. Notes Wound exam; the wound looks stable. There is not much undermining. Rolled edges of skin and subcutaneous tissue with the margins. No evidence of infection Electronic Signature(s) Signed: 08/14/2019 6:00:34 PM By: Allen Najjar MD Entered By: Allen Gregory on 08/14/2019 09:46:24 -------------------------------------------------------------------------------- Physician Orders Details Patient Name: Date of Service: Allen, Gregory 08/14/2019 8:45 AM Medical Record  [id number] Patient Account Number: [id number] Date of Birth/Sex: Treating RN: 1960/09/25 (59 y.o. 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 - MD visit Dressing Change Frequency Wound #1 Right Toe Great Change Dressing every other day. Wound Cleansing Wound #1 Right Toe Great May shower and wash wound with soap and water. Primary Wound Dressing Wound #1 Right Toe Great Silver Collagen - moisten with hydrogel Secondary Dressing Wound #1 Right Toe Great Kerlix/Rolled Gauze - secure with tape Dry Gauze Off-Loading Other: - forefoot off loader Electronic Signature(s) Signed: 08/14/2019 6:00:34 PM By: Allen Najjar MD Signed: 08/14/2019 6:06:02 PM By: Allen Pax RN Entered By: Allen Gregory on 08/14/2019 09:32:35 -------------------------------------------------------------------------------- Problem List Details Patient Name: Date of Service: Allen Gregory, Allen Gregory 08/14/2019 8:45 AM Medical Record [id number] Patient Account Number: [id number] Date of Birth/Sex: Treating RN: 1960-10-09 (59 y.o. Melonie [place] Primary Care Provider: PATIENT, NO Other Clinician: Referring Provider:  Treating Provider/Extender:Bernerd Terhune, Allen Gregory in Treatment: 5 Active Problems ICD-10 Evaluated Encounter Code Description Active Date Today Diagnosis E11.621 Type 2 diabetes mellitus with foot ulcer 07/09/2019 No Yes L97.512 Non-pressure chronic  ulcer of other part of right foot 07/09/2019 No Yes with fat layer exposed E11.42 Type 2 diabetes mellitus with diabetic polyneuropathy 07/09/2019 No Yes M21.371 Foot drop, right foot 07/09/2019 No Yes E11.51 Type 2 diabetes mellitus with diabetic peripheral 07/31/2019 No Yes angiopathy without gangrene Inactive Problems Resolved Problems Electronic Signature(s) Signed: 08/14/2019 6:00:34 PM By: Allen Najjar MD Entered By: Allen Gregory on 08/14/2019 09:44:29 -------------------------------------------------------------------------------- Progress Note Details Patient Name: Date of Service: Allen Gregory, Allen Gregory 08/14/2019 8:45 AM Medical Record [id number] Patient Account Number: [id number] Date of Birth/Sex: Treating RN: 10-27-1960 (59 y.o. Allen Gregory) Allen Gregory Primary Care Provider: PATIENT, NO Other Clinician: Referring Provider: Treating Provider/Extender:Damean Poffenberger, Allen Gregory in Treatment: 5 Subjective History of Present Illness (HPI) ADMISSION 07/09/2019 This is a pleasant 59 year old man who referred himself here for second opinion sign a new area on the right plantar first toe and the dorsal part of his right fifth toe. He says he had these in August when he had removed callus from the first toe and then played pool volleyball for about 4 hours. He thinks the bottom of the pool simply caused an abrasion of the toe. The story sounds accurate. He has been going to podiatry for the last several months. Me a picture on his phone from August and the wound is gotten considerably smaller. He states that it started doing well recently when they started collagen. He has been using a surgical shoe to offload. 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 He had a brace for a long time but now he simply lifts his foot higher to clear but states  that most people would not even notice that he had any disability. He is a type 2 diabetes Badik but he has diet-controlled he has lost weight by watching calories. 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 07/16/2019; right fifth toe is closed. Right first toe is smaller. He is using a surgical shoe but he tells me is fairly religious about using his scooter at home. Hopefully this will give him enough offloading to close this. 2/1; right fifth toe dorsally remains closed. Right first toe still not a lot of improvement. Although superficially it looks smaller there is undermining laterally from 6-6 of at least 3 to 4 mm. Also worrisome is that the granulation does not look that healthy. We have been using silver collagen He had his foot x-rayed by his podiatrist but I do not have access to this. I am going to x-ray the right great toe again. Otherwise I am thinking he is going to require a total contact cast probably next week and I discussed this with him today. 2/9; his x-ray of the right foot did not show plain x-ray evidence of osteomyelitis. We have been using silver alginate the wound is not improved looks static. He is going to get a total contact cast today He comes in today telling me that before he left podiatry before he came here he had noninvasive arterial studies that were done by a  mobile service and the podiatrist office. They are now trying to arrange I think an angiogram however this would be in [facility]. He asked my opinion on this and I told him that angiograms are done by several different doctor groups locally and if he is from [place] see a reason to go out of [facility] for any procedure he might need. We will see if we can get the actual angiogram report and I will refer him as needed to either  vein and vascular or interventional cardiology 2/12; back for his first obligatory total contact cast change. I did get his arterial studies from [facility]. Biphasic waveforms ABI in the right of 1.16 on the left at 1.14 I am not really sure what was so concerning about this [place] I will simply repeat these studies along with TBI's locally. There should not be any need for him to go out of town to have this evaluation. I am not sure that there is a clinical need 2/16; once again the total contact cast was too loose he has multiple skin excoriations where things were rubbing. He claims to not be on this all that much and is working from home but between the size of his foot the wasting in the distal part of his lower leg and large calfs we just cannot mold the cast in to fit his leg properly.-Changed him to a forefoot off loader but with his foot drop that may not be possible. 2/23; we put him out in a surgical shoe last week as we did not have a forefoot off loader. We have this today. We are using silver alginate. He did not tolerate a total contact cast Objective Constitutional Patient is hypertensive.. Pulse regular and within target range for patient.Marland Kitchen Respirations regular, non-labored and within target range.. Temperature is normal and within the target range for the patient.Marland Kitchen Appears in no distress. Vitals Time Taken: 9:06 AM, Height: 78 in, Source: Stated, Weight: 285 lbs, Source: Stated, BMI: 32.9, Temperature: 98.4 F, Pulse: 74 bpm, Respiratory Rate: 18 breaths/min, Blood Pressure: 143/70 mmHg. General Notes: Wound exam; the wound looks stable. There is not much undermining. Rolled edges of skin and subcutaneous tissue with the margins. No evidence of infection Integumentary (Hair, Skin) Wound #1 status is Open. Original cause of wound was Gradually Appeared. The wound is located on the Right Toe Great. The wound  measures 1.2cm length x 0.6cm width x 0.3cm depth; 0.565cm^2 area and 0.17cm^3 volume. There is Fat Layer (Subcutaneous Tissue) Exposed exposed. There is no tunneling or undermining noted. There is a small amount of serosanguineous drainage noted. The wound margin is thickened. There is large (67-100%) red, friable granulation within the wound bed. There is a small (1-33%) amount of necrotic tissue within the wound bed including Adherent Slough. 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 - MD visit Dressing Change Frequency: Wound #1 Right Toe Great: Change Dressing every other day. Wound Cleansing: Wound #1 Right Toe Great: May shower and wash wound with soap and water. Primary Wound Dressing: Wound #1 Right Toe Great: Silver Collagen - moisten with hydrogel Secondary Dressing: Wound #  1 Right Toe Great: Kerlix/Rolled Gauze - secure with tape Dry Gauze Off-Loading: Other: - forefoot off loader 1. I change the dressing to moistened silver collagen which the patient will change every second day 2. Forefoot off loader. For various reasons he did not tolerate a total contact cast Electronic Signature(s) Signed: 08/14/2019 6:00:34 PM By: Allen Najjar MD Entered By: Allen Gregory on 08/14/2019 09:46:58 -------------------------------------------------------------------------------- SuperBill Details Patient Name: Date of Service: Allen Gregory, Allen Gregory 08/14/2019 Medical Record [id number] Patient Account Number: [id number] Date of Birth/Sex: Treating RN: 1961-03-10 (59 y.o. 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 M21.371 Foot drop, right foot E11.51 Type 2 diabetes mellitus with diabetic peripheral angiopathy without gangrene Facility Procedures CPT4 Code: [id number] Description: 41937 - WOUND CARE VISIT-LEV 3 EST PT Modifier: Quantity: 1 Physician Procedures CPT4 Code Description: [id number] - WC PHYS LEVEL 3 - EST PT ICD-10 Diagnosis Description L97.512 Non-pressure chronic ulcer of other part of right foot E11.621 Type 2 diabetes mellitus with foot ulcer Modifier: with fat layer e Quantity: 1 xposed Electronic Signature(s) Signed: 08/14/2019 6:00:34 PM By: Allen Najjar MD Entered By: Allen Gregory on 08/14/2019 09:47:20
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
## 2019-08-20 ENCOUNTER — Other Ambulatory Visit (HOSPITAL_COMMUNITY): Payer: Self-pay | Admitting: Internal Medicine

## 2019-08-20 ENCOUNTER — Other Ambulatory Visit: Payer: Self-pay

## 2019-08-20 ENCOUNTER — Encounter (HOSPITAL_BASED_OUTPATIENT_CLINIC_OR_DEPARTMENT_OTHER): Payer: 59 | Attending: Internal Medicine | Admitting: Internal Medicine

## 2019-08-20 DIAGNOSIS — E1151 Type 2 diabetes mellitus with diabetic peripheral angiopathy without gangrene: Secondary | ICD-10-CM | POA: Insufficient documentation

## 2019-08-20 DIAGNOSIS — Z96641 Presence of right artificial hip joint: Secondary | ICD-10-CM | POA: Diagnosis not present

## 2019-08-20 DIAGNOSIS — L97512 Non-pressure chronic ulcer of other part of right foot with fat layer exposed: Secondary | ICD-10-CM | POA: Diagnosis not present

## 2019-08-20 DIAGNOSIS — Z7901 Long term (current) use of anticoagulants: Secondary | ICD-10-CM | POA: Insufficient documentation

## 2019-08-20 DIAGNOSIS — E11621 Type 2 diabetes mellitus with foot ulcer: Secondary | ICD-10-CM | POA: Diagnosis not present

## 2019-08-20 DIAGNOSIS — L03031 Cellulitis of right toe: Secondary | ICD-10-CM | POA: Insufficient documentation

## 2019-08-20 DIAGNOSIS — Z86711 Personal history of pulmonary embolism: Secondary | ICD-10-CM | POA: Diagnosis not present

## 2019-08-20 DIAGNOSIS — M21371 Foot drop, right foot: Secondary | ICD-10-CM | POA: Diagnosis not present

## 2019-08-20 DIAGNOSIS — E1142 Type 2 diabetes mellitus with diabetic polyneuropathy: Secondary | ICD-10-CM | POA: Insufficient documentation

## 2019-08-20 NOTE — Progress Notes (Signed)
Allen Gregory ([id number]) Visit Report for 08/20/2019 Debridement Details Patient Name: Date of Service: Allen Gregory, Allen Gregory 08/20/2019 8:00 AM Medical Record [id number] Patient Account Number: [id number] Date of Birth/Sex: Treating RN: 1961-04-02 (59 y.o. Allen Gregory Primary Care Provider: PATIENT, NO Other Clinician: Referring Provider: Treating Provider/Extender:Amol Domanski, [name] in Treatment: 6 Debridement Performed for Wound #1 Right Toe Great Assessment: Performed By: Physician Maxwell Caul., MD Debridement Type: Debridement Severity of Tissue Pre Fat layer exposed Debridement: Level of Consciousness (Pre- Awake and Alert procedure): Pre-procedure Verification/Time Out Taken: Yes - 08:44 Start Time: 08:44 Pain Control: Lidocaine 5% topical ointment Total Area Debrided (L x W): 0.5 (cm) x 0.4 (cm) = 0.2 (cm) Tissue and other material Viable, Non-Viable, Callus, Subcutaneous debrided: Level: Skin/Subcutaneous Tissue Debridement Description: Excisional Instrument: Curette Bleeding: Moderate Hemostasis Achieved: Silver Nitrate End Time: 08:45 Procedural Pain: 0 Post Procedural Pain: 0 Response to Treatment: Procedure was tolerated well Level of Consciousness Awake and Alert (Post-procedure): Post Debridement Measurements of Total Wound Length: (cm) 0.5 Width: (cm) 0.4 Depth: (cm) 0.6 Volume: (cm) 0.094 Character of Wound/Ulcer Post Improved Debridement: Severity of Tissue Post Debridement: Fat layer exposed Post Procedure Diagnosis Same as Pre-procedure Electronic Signature(s) Signed: 08/20/2019 5:45:55 PM By: Baltazar Najjar MD Signed: 08/20/2019 5:57:14 PM By: Zandra Abts RN, BSN Entered By: Baltazar Najjar on 08/20/2019 08:49:47 -------------------------------------------------------------------------------- HPI Details Patient Name: Date of Service: Allen Gregory 08/20/2019 8:00 AM Medical Record [id number] Patient Account Number:  [id number] Date of Birth/Sex: Treating RN: 08-Apr-1961 (59 y.o. Allen Gregory Primary Care Provider: PATIENT, NO Other Clinician: Referring Provider: Treating Provider/Extender:Roselle Norton, [name] in Treatment: 6 History of Present Illness HPI Description: ADMISSION 07/09/2019 This is a pleasant 59 year old man who referred himself here for second opinion sign a new area on the right plantar first toe and the dorsal part of his right fifth toe. He says he had these in August when he had removed callus from the first toe and then played pool volleyball for about 4 hours. He thinks the bottom of the pool simply caused an abrasion of the toe. The story sounds accurate. He has been going to podiatry for the last several months. Me a picture on his phone from August and the wound is gotten considerably smaller. He states that it started doing well recently when they started collagen. He has been using a surgical shoe to offload. 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 He had a brace for a long time but now he simply lifts his foot higher to clear but states that most people would not even notice that he had any disability. He is a type 2 diabetes Badik but he has diet-controlled he has lost weight by watching calories. 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 07/16/2019; right fifth toe is closed. Right first toe is smaller. He is using a surgical shoe but he tells me is fairly religious about using his scooter at home. Hopefully this will give him enough offloading to close this. 2/1; right fifth toe dorsally remains closed. Right first toe still not a lot of improvement. Although superficially  it looks smaller there is undermining laterally from 6-6 of at least 3 to 4 mm. Also  worrisome is that the granulation does not look that healthy. We have been using silver collagen He had his foot x-rayed by his podiatrist but I do not have access to this. I am going to x-ray the right great toe again. Otherwise I am thinking he is going to require a total contact cast probably next week and I discussed this with him today. 2/9; his x-ray of the right foot did not show plain x-ray evidence of osteomyelitis. We have been using silver alginate the wound is not improved looks static. He is going to get a total contact cast today He comes in today telling me that before he left podiatry before he came here he had noninvasive arterial studies that were done by a mobile service and the podiatrist office. They are now trying to arrange I think an angiogram however [facility]. He asked my opinion on this and I told him that angiograms are done by several different doctor groups locally and [place] is from [place] I cannot see a reason to go out of [facility] for any procedure he might need. We will see if we can get the actual angiogram report and I will refer him as needed to either vein and vascular or interventional cardiology 2/12; back for his first obligatory total contact cast change. I did get his arterial studies [facility]. Biphasic waveforms ABI in the right of 1.16 on the left at 1.14 I am not really sure what was so concerning about this [place] I will simply repeat these studies along with TBI's locally. There should not be any need for him to go out of town to have this evaluation. I am not sure that there is a clinical need 2/16; once again the total contact cast was too loose he has multiple skin excoriations where things were rubbing. He claims to not be on this all that much and is  working from home but between the size of his foot the wasting in the distal part of his lower leg and large calfs we just cannot mold the cast in to fit his leg properly.-Changed him to a forefoot off loader but with his foot drop that may not be possible. 2/23; we put him out in a surgical shoe last week as we did not have a forefoot off loader. We have this today. We are using silver alginate. He did not tolerate a total contact cast 3/1; he is in a forefoot offloading boot. Using silver collagen as of last week. No major change Electronic Signature(s) Signed: 08/20/2019 5:45:55 PM By: Baltazar Najjarobson, Caylee Vlachos MD Entered By: Baltazar Najjarobson, Sophee Mckimmy on 08/20/2019 08:50:12 -------------------------------------------------------------------------------- Physical Exam Details Patient Name: Date of Service: Kizzie FurnishROSOL, Allen Gregory 08/20/2019 8:00 AM Medical Record [id number] Patient Account Number: [id number] Date of Birth/Sex: Treating RN: 03/04/1961 (59 y.o. Allen KollerM) Lynch, Shatara Primary Care Provider: PATIENT, NO Other Clinician: Referring Provider: Treating Provider/Extender:Lucrecia Mcphearson, Lamar SprinklesMichael Weeks in Treatment: 6 Constitutional Sitting or standing Blood Pressure is within target range for patient.. Pulse regular and within target range for patient.Marland Kitchen. Respirations regular, non-labored and within target range.. Temperature is normal and within the target range for the patient.Marland Kitchen. Appears in no distress. Notes Wound exam; the wound looks stable on initial LOC however circumferential undermining of 0.5 cm at least the bed of the wound does not look unhealthy although the depth of the wound I think is completely unchanged. There  is no exposed bone no evidence of infection. Using a #5 curette I remove the overhanging skin and subcutaneous tissue hemostasis with silver nitrate and direct pressure Electronic Signature(s) Signed: 08/20/2019 5:45:55 PM By: Baltazar Najjar MD Entered By: Baltazar Najjar on 08/20/2019  08:51:16 -------------------------------------------------------------------------------- Physician Orders Details Patient Name: Date of Service: Allen Gregory, Allen Gregory 08/20/2019 8:00 AM Medical Record [id number] Patient Account Number: [id number] Date of Birth/Sex: Treating RN: 1961/02/08 (59 y.o. 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 Dressing Change Frequency Wound #1 Right Toe Great Change Dressing every other day. Wound Cleansing Wound #1 Right Toe Great May shower and wash wound with soap and water. Primary Wound Dressing Wound #1 Right Toe Great Silver Collagen - moisten with hydrogel Secondary Dressing Wound #1 Right Toe Great Foam - foam donut Kerlix/Rolled Gauze - secure with tape Dry Gauze Off-Loading Wedge shoe to: - right foot Electronic Signature(s) Signed: 08/20/2019 5:45:55 PM By: Baltazar Najjar MD Signed: 08/20/2019 5:57:14 PM By: Zandra Abts RN, BSN Entered By: Zandra Abts on 08/20/2019 08:48:36 -------------------------------------------------------------------------------- Problem List Details Patient Name: Date of Service: Allen Gregory, Allen Gregory 08/20/2019 8:00 AM Medical Record [id number] Patient Account Number: [id number] Date of Birth/Sex: Treating RN: May 23, 1961 (59 y.o. Allen Gregory Primary Care Provider: PATIENT, NO Other Clinician: Referring Provider: Treating Provider/Extender:Enedelia Martorelli,  [name] in Treatment: 6 Active Problems ICD-10 Evaluated Encounter Code Description Active Date Today Diagnosis E11.621 Type 2 diabetes mellitus with foot ulcer 07/09/2019 No Yes L97.512 Non-pressure chronic ulcer of other part of right foot 07/09/2019 No Yes with fat layer exposed E11.42 Type 2 diabetes mellitus with diabetic polyneuropathy 07/09/2019 No Yes M21.371 Foot drop, right foot 07/09/2019 No Yes E11.51 Type 2 diabetes mellitus with diabetic peripheral 07/31/2019 No Yes angiopathy without gangrene Inactive Problems Resolved Problems Electronic Signature(s) Signed: 08/20/2019 5:45:55 PM By: Baltazar Najjar MD Entered By: Baltazar Najjar on 08/20/2019 08:49:33 -------------------------------------------------------------------------------- Progress Note Details Patient Name: Date of Service: Allen Gregory, Allen Gregory 08/20/2019 8:00 AM Medical Record [id number] Patient Account Number: [id number] Date of Birth/Sex: Treating RN: Dec 26, 1960 (58 y.o. Allen Gregory Primary Care Provider: PATIENT, NO Other Clinician: Referring Provider: Treating Provider/Extender:Shaylea Ucci, [name] in Treatment: 6 Subjective History of Present Illness (HPI) ADMISSION 07/09/2019 This is a pleasant 59 year old man who referred himself here for second opinion sign a new area on the right plantar first toe and the dorsal part of his right fifth toe. He says he had these in August when he had removed callus from the first toe and then played pool volleyball for about 4 hours. He thinks the bottom of the pool simply caused an abrasion of the toe. The story sounds accurate. He has been going to podiatry for the last several months. Me a picture on his phone from August and the wound is gotten considerably smaller. He states that it started doing well recently when they started collagen. He has been using a surgical shoe to offload. 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 He had a brace for a long time but now he simply lifts his foot higher to clear but states that most people would not even notice that he had any disability. He is a type 2 diabetes Badik but he has diet-controlled he has lost weight by watching calories. 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 07/16/2019; right fifth toe is closed. Right first toe is smaller. He is using a surgical shoe but he tells me is fairly religious about using his scooter at home. Hopefully this will give him enough offloading to close this. 2/1; right fifth toe dorsally remains closed. Right first toe still not a lot of improvement. Although superficially it looks smaller there is undermining laterally from 6-6 of at least 3 to 4 mm. Also worrisome is that the granulation does not look that healthy. We have been using silver collagen He had his foot x-rayed by his podiatrist but I do not have access to this. I am going to x-ray the right great toe again. Otherwise I am thinking he is going to require a total contact cast probably next week and I discussed this with him today. 2/9; his x-ray of the right foot did not show plain x-ray evidence of osteomyelitis. We have been using silver alginate the wound is not improved looks static. He is going to get a total contact cast today He comes in today telling me that before he left podiatry before he came here he had noninvasive arterial studies that were done by a mobile service and the podiatrist office. They are now trying to arrange I think an angiogram however this would be in [facility]. He asked my opinion on this and I told him that angiograms are done by several different doctor  groups locally and if he is from [place] I cannot see a reason to go out of [facility] for any procedure he might need. We will see if we can get the actual angiogram report and I will refer him as needed to either vein and vascular or interventional cardiology 2/12; back for his first obligatory total contact cast change. I did get his arterial studies from [facility]. Biphasic waveforms ABI in the right of 1.16 on the left at 1.14 I am not really sure what was so concerning about this [place] I will simply repeat these studies along with TBI's locally. There should not be any need for him to go out of town to have this evaluation. I am not sure that there is a clinical need 2/16; once again the total contact cast was too loose he has multiple skin excoriations where things were rubbing. He claims to not be on this all that much and is working from home but between the size of his foot the wasting in the distal part of his lower leg and large calfs we just cannot mold the cast in to fit his leg properly.-Changed him to a forefoot off loader but with his foot drop that may not be possible. 2/23; we put him out in a surgical shoe last week as we did not have a forefoot off loader. We have this today. We are using silver alginate. He did not tolerate a total contact  cast 3/1; he is in a forefoot offloading boot. Using silver collagen as of last week. No major change Objective Constitutional Sitting or standing Blood Pressure is within target range for patient.. Pulse regular and within target range for patient.Marland Kitchen Respirations regular, non-labored and within target range.. Temperature is normal and within the target range for the patient.Marland Kitchen Appears in no distress. Vitals Time Taken: 8:29 AM, Height: 78 in, Weight: 285 lbs, BMI: 32.9, Temperature: 98.3 F, Pulse: 72 bpm, Respiratory Rate: 18 breaths/min, Blood Pressure: 138/70  mmHg. General Notes: Wound exam; the wound looks stable on initial LOC however circumferential undermining of 0.5 cm at least the bed of the wound does not look unhealthy although the depth of the wound I think is completely unchanged. There is no exposed bone no evidence of infection. Using a #5 curette I remove the overhanging skin and subcutaneous tissue hemostasis with silver nitrate and direct pressure Integumentary (Hair, Skin) Wound #1 status is Open. Original cause of wound was Gradually Appeared. The wound is located on the Right Toe Great. The wound measures 0.5cm length x 0.4cm width x 0.6cm depth; 0.157cm^2 area and 0.094cm^3 volume. There is Fat Layer (Subcutaneous Tissue) Exposed exposed. There is no tunneling noted, however, there is undermining starting at 10:00 and ending at 7:00 with a maximum distance of 0.5cm. There is a small amount of serosanguineous drainage noted. The wound margin is thickened. There is large (67-100%) red, friable granulation within the wound bed. There is a small (1-33%) amount of necrotic tissue within the wound bed including Adherent Slough. 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 There was a Excisional Skin/Subcutaneous Tissue Debridement with a total area of 0.2 sq cm performed by Ricard Dillon., MD. With the following instrument(s): Curette to remove Viable and Non-Viable tissue/material. Material removed includes Callus and Subcutaneous Tissue and after achieving pain control using  Lidocaine 5% topical ointment. No specimens were taken. A time out was conducted at 08:44, prior to the start of the procedure. A Moderate amount of bleeding was controlled with Silver Nitrate. The procedure was tolerated well with a pain level of 0 throughout and a pain level of 0 following the procedure. Post Debridement Measurements: 0.5cm length x 0.4cm width x 0.6cm depth; 0.094cm^3 volume. Character of Wound/Ulcer Post Debridement is improved. Severity of Tissue Post Debridement is: Fat layer exposed. Post procedure Diagnosis Wound #1: Same as Pre-Procedure Plan Follow-up Appointments: Return Appointment in 1 week. Dressing Change Frequency: Wound #1 Right Toe Great: Change Dressing every other day. Wound Cleansing: Wound #1 Right Toe Great: May shower and wash wound with soap and water. Primary Wound Dressing: Wound #1 Right Toe Great: Silver Collagen - moisten with hydrogel Secondary Dressing: Wound #1 Right Toe Great: Foam - foam donut Kerlix/Rolled Gauze - secure with tape Dry Gauze Off-Loading: Wedge shoe to: - right foot 1. He claims to be compliant with the forefoot offloading boot 2. Continue with silver collagen this week, only started this last week. I have asked him to double up on the kerlix padding. 3. Emphasized offloading once again. Not a total contact  cast candidate 4. I do not see anything else which is obvious. No evidence of infection. No evidence of ischemia Electronic Signature(s) Signed: 08/20/2019 5:45:55 PM By: Baltazar Najjar MD Entered By: Baltazar Najjar on 08/20/2019 08:52:29 -------------------------------------------------------------------------------- SuperBill Details Patient Name: Date of Service: Allen Gregory, Allen Gregory 08/20/2019 Medical Record [id number] Patient Account Number: [id number] Date of Birth/Sex: Treating RN: 01-Jan-1961 (59 y.o. 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 M21.371 Foot drop, right foot E11.51 Type 2 diabetes mellitus with diabetic peripheral angiopathy without gangrene Facility Procedures CPT4 Code Description: [id number] 11042 - DEB SUBQ TISSUE 20 SQ CM/< ICD-10 Diagnosis Description L97.512 Non-pressure chronic ulcer of other part of right foot wit E11.621 Type 2 diabetes mellitus with foot ulcer Modifier: h fat layer exp Quantity: 1 osed Physician Procedures CPT4 Code Description: [id number] 11042 - WC PHYS SUBQ TISS 20 SQ CM ICD-10 Diagnosis Description L97.512 Non-pressure chronic ulcer of other part of right foot wit E11.621 Type 2 diabetes mellitus with foot ulcer Modifier: h fat layer expo Quantity: 1 sed Electronic Signature(s) Signed: 08/20/2019 5:45:55 PM By: Baltazar Najjar MD Entered By: Baltazar Najjar on 08/20/2019 08:52:45

## 2019-08-21 ENCOUNTER — Ambulatory Visit (HOSPITAL_COMMUNITY)
Admission: RE | Admit: 2019-08-21 | Discharge: 2019-08-21 | Disposition: A | Discharge status: Home / Self Care | Payer: 59 | Source: Ambulatory Visit | Attending: Cardiology | Admitting: Cardiology

## 2019-08-21 DIAGNOSIS — E11621 Type 2 diabetes mellitus with foot ulcer: Secondary | ICD-10-CM | POA: Diagnosis not present

## 2019-08-21 DIAGNOSIS — L97512 Non-pressure chronic ulcer of other part of right foot with fat layer exposed: Secondary | ICD-10-CM | POA: Insufficient documentation

## 2019-08-21 NOTE — Progress Notes (Addendum)
Allen Gregory, Allen Gregory ([id number]) Visit Report for 08/20/2019 Arrival Information Details Patient Name: Date of Service: Allen Gregory, Allen Gregory 08/20/2019 8:00 AM Medical Record [id number] Patient Account Number: [id number] Date of Birth/Sex: Treating RN: [birth year] (59 y.o. Allen Gregory Primary Care Allen Gregory: PATIENT, NO Other Clinician: Referring Allen Gregory: Treating Allen Gregory/Extender:Allen Gregory in Treatment: 6 Visit Information History Since Last Visit Added or deleted any medications: No Patient Arrived: Ambulatory Any new allergies or adverse reactions: No Arrival Time: 08:28 Had a fall or experienced change in No Accompanied By: self activities of daily living that may affect Transfer Assistance: None risk of falls: Patient Identification Verified: Yes Signs or symptoms of abuse/neglect No Secondary Verification Process Yes since last visito Completed: Hospitalized since last visit: No Patient Requires Transmission- No Implantable device outside of the clinic No Based Precautions: excluding Patient Has Alerts: Yes cellular tissue based products placed in Patient Alerts: Patient on Blood the center Thinner since last visit: Has Footwear/Offloading in Place as Yes Prescribed: Gregory: Other:front offloader Pain Present Now: No Electronic Signature(s) Signed: 08/20/2019 5:21:05 PM By: Allen Gregory Entered By: Allen Gregory on 08/20/2019 08:29:04 -------------------------------------------------------------------------------- Encounter Discharge Information Details Patient Name: Date of Service: Allen Gregory, Allen Gregory 08/20/2019 8:00 AM Medical Record [id number] Patient Account Number: [id number] Date of Birth/Sex: Treating RN: 1960-08-02 (59 y.o. Allen Gregory Primary Care Allen Gregory: PATIENT, NO Other Clinician: Referring Allen Gregory: Treating Allen Gregory/Extender:Allen Gregory in Treatment: 6 Encounter Discharge Information Items Post Procedure  Vitals Discharge Condition: Stable Temperature (F): 98.3 Ambulatory Status: Ambulatory Pulse (bpm): 72 Discharge Destination: Home Respiratory Rate (breaths/min): 18 Transportation: Private Auto Blood Pressure (mmHg): 138/70 Accompanied By: self Schedule Follow-up Appointment: Yes Clinical Summary of Care: Patient Declined Electronic Signature(s) Signed: 08/21/2019 4:52:50 PM By: Allen Deed RN, BSN Entered By: Allen Gregory on 08/20/2019 09:00:31 -------------------------------------------------------------------------------- Lower Extremity Assessment Details Patient Name: Date of Service: Allen Gregory, Allen Gregory 08/20/2019 8:00 AM Medical Record [id number] Patient Account Number: [id number] Date of Birth/Sex: Treating RN: January 21, 1961 (59 y.o. Allen Gregory Primary Care Antrell Tipler: PATIENT, NO Other Clinician: Referring Allen Gregory: Treating Allen Gregory/Extender:Allen Gregory in Treatment: 6 Edema Assessment Assessed: [Left: No] [Gregory: No] Edema: [Left: N] [Gregory: o] Calf Left: Gregory: Point of Measurement: 38 cm From Medial Instep cm 45.5 cm Ankle Left: Gregory: Point of Measurement: 10 cm From Medial Instep cm 24.5 cm Vascular Assessment Pulses: Dorsalis Pedis Palpable: [Gregory:Yes] Electronic Signature(s) Signed: 08/20/2019 5:21:05 PM By: Allen Gregory Entered By: Allen Gregory on 08/20/2019 08:31:48 -------------------------------------------------------------------------------- Multi Wound Chart Details Patient Name: Date of Service: Allen Gregory, Allen Gregory 08/20/2019 8:00 AM Medical Record [id number] Patient Account Number: [id number] Date of Birth/Sex: Treating RN: Sep 21, 1960 (59 y.o. Allen Gregory Primary Care Allen Gregory: PATIENT, NO Other Clinician: Referring Allen Gregory: Treating Allen Gregory/Extender:Allen Gregory in Treatment: 6 Vital Signs Height(in): 78 Pulse(bpm): 72 Weight(lbs): 285 Blood Pressure(mmHg): 138/70 Body Mass Index(BMI):  33 Temperature(F): 98.3 Respiratory 18 Rate(breaths/min): Photos: [1:No Photos] [N/A:N/A] Wound Location: [1:Gregory Toe Great] [N/A:N/A] Wounding Event: [1:Gradually Appeared] [N/A:N/A] Primary Etiology: [1:Diabetic Wound/Ulcer of the N/A Lower Extremity] Comorbid History: [1:Pneumothorax, Deep Vein N/A Thrombosis, Type II Diabetes, Neuropathy] Date Acquired: [1:01/20/2019] [N/A:N/A] Weeks of Treatment: [1:6] [N/A:N/A] Wound Status: [1:Open] [N/A:N/A] Measurements L x W x D 0.5x0.4x0.6 [N/A:N/A] (cm) Area (cm) : [1:0.157] [N/A:N/A] Volume (cm) : [1:0.094] [N/A:N/A] % Reduction in Area: [1:66.70%] [N/A:N/A] % Reduction in Volume: 60.20% [N/A:N/A] Starting Position 1 10 (o'clock): Ending Position 1 [1:7] (o'clock): Maximum Distance 1 [1:0.5] (cm): Undermining: [1:Yes] [N/A:N/A] Classification: [1:Grade 2] [N/A:N/A] Exudate Amount: [1:Small] [N/A:N/A] Exudate Type: [1:Serosanguineous] [N/A:N/A] Exudate Color: [1:red, brown] [  N/A:N/A] Wound Margin: [1:Thickened] [N/A:N/A] Granulation Amount: [1:Large (67-100%)] [N/A:N/A] Granulation Quality: [1:Red, Friable] [N/A:N/A] Necrotic Amount: [1:Small (1-33%)] [N/A:N/A] Exposed Structures: [1:Fat Layer (Subcutaneous N/A Tissue) Exposed: Yes Fascia: No Tendon: No Muscle: No Joint: No Bone: No] Epithelialization: [1:None] [N/A:N/A] Debridement: [1:Debridement - Excisional N/A] Pre-procedure [1:08:44] [N/A:N/A] Verification/Time Out Taken: Pain Control: [1:Lidocaine 5% topical ointment] [N/A:N/A] Tissue Debrided: [1:Callus, Subcutaneous] [N/A:N/A] Level: [1:Skin/Subcutaneous Tissue] [N/A:N/A] Debridement Area (sq cm):0.2 [N/A:N/A] Instrument: [1:Curette] [N/A:N/A] Bleeding: [1:Moderate] [N/A:N/A] Hemostasis Achieved: [1:Silver Nitrate] [N/A:N/A] Procedural Pain: [1:0] [N/A:N/A] Post Procedural Pain: [1:0] [N/A:N/A] Debridement Treatment Procedure was tolerated [N/A:N/A] Response: [1:well] Post Debridement [1:0.5x0.4x0.6]  [N/A:N/A] Measurements L x W x D (cm) Post Debridement [1:0.094] [N/A:N/A] Volume: (cm) Procedures Performed: Debridement [N/A:N/A] Treatment Notes Electronic Signature(s) Signed: 08/20/2019 5:45:55 PM By: Baltazar Najjar MD Signed: 08/20/2019 5:57:14 PM By: Zandra Abts RN, BSN Entered By: Baltazar Najjar on 08/20/2019 08:49:38 -------------------------------------------------------------------------------- Multi-Disciplinary Care Plan Details Patient Name: Date of Service: Allen Gregory, Allen Gregory 08/20/2019 8:00 AM Medical Record [id number] Patient Account Number: [id number] Date of Birth/Sex: Treating RN: 06/06/1961 (59 y.o. Allen Gregory Primary Care Berry Godsey: PATIENT, NO Other Clinician: Referring Victorio Creeden: Treating Adriana Quinby/Extender:Allen Gregory in Treatment: 6 Active Inactive Wound/Skin Impairment Nursing Diagnoses: Impaired tissue integrity Knowledge deficit related to ulceration/compromised skin integrity Goals: Patient/caregiver will verbalize understanding of skin care regimen Date Initiated: 07/09/2019 Target Resolution Date: 09/07/2019 Goal Status: Active Interventions: Assess patient/caregiver ability to obtain necessary supplies Assess patient/caregiver ability to perform ulcer/skin care regimen upon admission and as needed Assess ulceration(s) every visit Provide education on ulcer and skin care Notes: Electronic Signature(s) Signed: 08/20/2019 5:57:14 PM By: Zandra Abts RN, BSN Entered By: Zandra Abts on 08/20/2019 17:18:26 -------------------------------------------------------------------------------- Pain Assessment Details Patient Name: Date of Service: Allen Gregory, Allen Gregory 08/20/2019 8:00 AM Medical Record [id number] Patient Account Number: [id number] Date of Birth/Sex: Treating RN: [birth year] (59 y.o. Allen Gregory Primary Care Artavis Cowie: PATIENT, NO Other Clinician: Referring Marcelle Bebout: Treating Trishia Cuthrell/Extender:Allen Gregory,  Allen Gregory in Treatment: 6 Active Problems Location of Pain Severity and Description of Pain Patient Has Paino No Site Locations Pain Management and Medication Current Pain Management: Electronic Signature(s) Signed: 08/20/2019 5:21:05 PM By: Allen Gregory Entered By: Allen Gregory on 08/20/2019 08:31:36 -------------------------------------------------------------------------------- Patient/Caregiver Education Details Patient Name: Date of Service: Allen Gregory, Allen Gregory 3/1/2021andnbsp8:00 AM Medical Record [id number] Patient Account Number: [id number] Date of Birth/Gender: Treating RN: 1960/07/26 (59 y.o. Allen Gregory Primary Care Physician: PATIENT, NO Other Clinician: Referring Physician: Treating Physician/Extender:Allen Gregory in Treatment: 6 Education Assessment Education Provided To: Patient Education Topics Provided Wound/Skin Impairment: Methods: Explain/Verbal Responses: State content correctly Electronic Signature(s) Signed: 08/20/2019 5:57:14 PM By: Zandra Abts RN, BSN Entered By: Zandra Abts on 08/20/2019 17:18:40 -------------------------------------------------------------------------------- Wound Assessment Details Patient Name: Date of Service: Allen Gregory, Allen Gregory 08/20/2019 8:00 AM Medical Record [id number] Patient Account Number: [id number] Date of Birth/Sex: Treating RN: 10-04-1960 (59 y.o. Allen Gregory Primary Care Tashyra Adduci: PATIENT, NO Other Clinician: Referring Chellsea Beckers: Treating Shenea Giacobbe/Extender:Allen Gregory in Treatment: 6 Wound Status Wound Number: 1 Primary Diabetic Wound/Ulcer of the Lower Etiology: Extremity Wound Location: Gregory Toe Great Wound Open Wounding Event: Gradually Appeared Status: Date Acquired: 01/20/2019 Comorbid Pneumothorax, Deep Vein Thrombosis, Type Weeks Of Treatment: 6 History: II Diabetes, Neuropathy Clustered Wound: No Photos Wound Measurements Length: (cm) 0.5 Width: (cm)  0.4 Depth: (cm) 0.6 Area: (cm) 0.157 Volume: (cm) 0.094 % Reduction in Area: 66.7% % Reduction in Volume: 60.2% Epithelialization: None Tunneling: No Undermining: Yes Starting Position (o'clock): 10 Ending Position (o'clock): 7 Maximum Distance: (cm) 0.5 Wound Description Classification:  Grade 2 Wound Margin: Thickened Exudate Amount: Small Exudate Type: Serosanguineous Exudate Color: red, brown Wound Bed Granulation Amount: Large (67-100%) Granulation Quality: Red, Friable Necrotic Amount: Small (1-33%) Necrotic Quality: Adherent Slough Foul Odor After Cleansing: No Slough/Fibrino Yes Exposed Structure Fascia Exposed: No Fat Layer (Subcutaneous Tissue) Exposed: Yes Tendon Exposed: No Muscle Exposed: No Joint Exposed: No Bone Exposed: No Treatment Notes Wound #1 (Gregory Toe Great) 3. Primary Dressing Applied Collegen AG Hydrogel or K-Y Jelly 4. Secondary Dressing Dry Gauze Roll Gauze Foam 5. Secured With Tape Other (specify in notes) 7. Footwear/Offloading device applied Wedge shoe Notes stretch net Electronic Signature(s) Signed: 08/23/2019 3:59:23 PM By: Mikeal Hawthorne EMT/HBOT Signed: 08/23/2019 5:39:26 PM By: Levan Hurst RN, BSN Previous Signature: 08/20/2019 5:57:14 PM Version By: Levan Hurst RN, BSN Previous Signature: 08/20/2019 5:57:14 PM Version By: Levan Hurst RN, BSN Entered By: Mikeal Hawthorne on 08/23/2019 11:44:58 -------------------------------------------------------------------------------- [place] Details Patient Name: Date of Service: Allen Gregory, Allen Gregory 08/20/2019 8:00 AM Medical Record [id number] Patient Account Number: [id number] Date of Birth/Sex: Treating RN: [birth year] (59 y.o. Marvis Repress Primary Care Christe Tellez: PATIENT, NO Other Clinician: Referring Veneta Sliter: Treating Jishnu Jenniges/Extender:Allen Gregory, Esperanza Richters in Treatment: 6 Vital Signs Time Taken: 08:29 Temperature (F): 98.3 Height (in): 78 Pulse (bpm): 72 Weight  (lbs): 285 Respiratory Rate (breaths/min): 18 Body Mass Index (BMI): 32.9 Blood Pressure (mmHg): 138/70 Reference Range: 80 - 120 mg / dl Electronic Signature(s) Signed: 08/20/2019 5:21:05 PM By: Kela Millin Entered By: Kela Millin on 08/20/2019 08:31:24

## 2019-08-27 ENCOUNTER — Other Ambulatory Visit: Payer: Self-pay

## 2019-08-27 ENCOUNTER — Encounter (HOSPITAL_BASED_OUTPATIENT_CLINIC_OR_DEPARTMENT_OTHER): Payer: 59 | Admitting: Internal Medicine

## 2019-08-27 DIAGNOSIS — E11621 Type 2 diabetes mellitus with foot ulcer: Secondary | ICD-10-CM | POA: Diagnosis not present

## 2019-08-27 NOTE — Progress Notes (Signed)
MACE, WEINBERG ([id number]) Visit Report for 08/27/2019 Debridement Details Patient Name: Date of Service: MOOSA, BUECHE 08/27/2019 8:15 AM Medical Record [id number] Patient Account Number: [id number] Date of Birth/Sex: Treating RN: 02-Aug-1960 (59 y.o. [name] Primary Care Provider: PATIENT, NO Other Clinician: Referring Provider: Treating Provider/Extender:Javayah Magaw, [name] in Treatment: 7 Debridement Performed for Wound #1 Right Toe Great Assessment: Performed By: Physician Maxwell Caul., MD Debridement Type: Debridement Severity of Tissue Pre Fat layer exposed Debridement: Level of Consciousness (Pre- Awake and Alert procedure): Pre-procedure Verification/Time Out Taken: Yes - 08:57 Start Time: 08:57 Total Area Debrided (L x W): 1 (cm) x 0.8 (cm) = 0.8 (cm) Tissue and other material Viable, Non-Viable, Callus, Subcutaneous debrided: Level: Skin/Subcutaneous Tissue Debridement Description: Excisional Instrument: Curette Bleeding: Minimum Hemostasis Achieved: Pressure End Time: 08:58 Procedural Pain: 0 Post Procedural Pain: 0 Response to Treatment: Procedure was tolerated well Level of Consciousness Awake and Alert (Post-procedure): Post Debridement Measurements of Total Wound Length: (cm) 1 Width: (cm) 0.8 Depth: (cm) 0.5 Volume: (cm) 0.314 Character of Wound/Ulcer Post Improved Debridement: Severity of Tissue Post Debridement: Fat layer exposed Post Procedure Diagnosis Same as Pre-procedure Electronic Signature(s) Signed: 08/27/2019 5:29:15 PM By: Baltazar Najjar MD Signed: 08/27/2019 5:59:01 PM By: Zandra Abts RN, BSN Entered By: Baltazar Najjar on 08/27/2019 09:19:02 -------------------------------------------------------------------------------- HPI Details Patient Name: Date of Service: DOMENIQUE, SOUTHERS 08/27/2019 8:15 AM Medical Record [id number] Patient Account Number: [id number] Date of Birth/Sex: Treating RN: [birth year] (59  y.o. [name] Primary Care Provider: PATIENT, NO Other Clinician: Referring Provider: Treating Provider/Extender:Louise Victory, [name] in Treatment: 7 History of Present Illness HPI Description: ADMISSION 07/09/2019 This is a pleasant 59 year old man who referred himself here for second opinion sign a new area on the right plantar first toe and the dorsal part of his right fifth toe. He says he had these in August when he had removed callus from the first toe and then played pool volleyball for about 4 hours. He thinks the bottom of the pool simply caused an abrasion of the toe. The story sounds accurate. He has been going to podiatry for the last several months. Me a picture on his phone from August and the wound is gotten considerably smaller. He states that it started doing well recently when they started collagen. He has been using a surgical shoe to offload. 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 He had a brace for a long time but now he simply lifts his foot higher to clear but states that most people would not even notice that he had any disability. He is a type 2 diabetes Badik but he has diet-controlled he has lost weight by watching calories. 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 07/16/2019; right fifth toe is closed. Right first toe is smaller. He is using a surgical shoe but he tells me is fairly religious about using his scooter at home. Hopefully this will give him enough offloading to close this. 2/1; right fifth toe dorsally remains closed. Right first toe still not a lot of improvement. Although superficially it looks smaller there is undermining laterally from 6-6  of at least 3 to 4 mm. Also worrisome is that the granulation does not  look that healthy. We have been using silver collagen He had his foot x-rayed by his podiatrist but I do not have access to this. I am going to x-ray the right great toe again. Otherwise I am thinking he is going to require a total contact cast probably next week and I discussed this with him today. 2/9; his x-ray of the right foot did not show plain x-ray evidence of osteomyelitis. We have been using silver alginate the wound is not improved looks static. He is going to get a total contact cast today He comes in today telling me that before he left podiatry before he came here he had noninvasive arterial studies that were done by a mobile service and the podiatrist office. They are now trying to arrange I think an angiogram however [facility]. He asked my opinion on this and I told him that angiograms are done by several different doctor groups locally and [place] from [place] I cannot see a [place] might need. We will see if we can get the actual angiogram report and I will refer him as needed to either vein and vascular or interventional cardiology 2/12; back for his first obligatory total contact cast change. I did get his arterial studies from [facility]. Biphasic waveforms ABI in the right of 1.16 on the left at 1.14 I am not really sure what was so concerning about [place] I will simply repeat these studies along with TBI's locally. There should not be any need for him to go out of town to have this evaluation. I am not sure that there is a clinical need 2/16; once again the total contact cast was too loose he has multiple skin excoriations where things were rubbing. He claims to not be on this all that much and is working from home but between the size of his foot the  wasting in the distal part of his lower leg and large calfs we just cannot mold the cast in to fit his leg properly.-Changed him to a forefoot off loader but with his foot drop that may not be possible. 2/23; we put him out in a surgical shoe last week as we did not have a forefoot off loader. We have this today. We are using silver alginate. He did not tolerate a total contact cast 3/1; he is in a forefoot offloading boot. Using silver collagen as of last week. No major change 3/8; he is using a forefoot offloading boot. Silver collagen over the last 2 weeks. Perhaps some reduction in depth. Moreover surface looks healthy Electronic Signature(s) Signed: 08/27/2019 5:29:15 PM By: Baltazar Najjarobson, Yony Roulston MD Entered By: Baltazar Najjarobson, Donella Pascarella on 08/27/2019 09:19:37 -------------------------------------------------------------------------------- Physical Exam Details Patient Name: Date of Service: Kizzie FurnishROSOL, Jacksyn 08/27/2019 8:15 AM Medical Record [id number] Patient Account Number: [id number] Date of Birth/Sex: Treating RN: 11/03/1960 (59 y.o. Elizebeth KollerM) Lynch, Shatara Primary Care Provider: PATIENT, NO Other Clinician: Referring Provider: Treating Provider/Extender:Maelani Yarbro, Lamar SprinklesMichael Weeks in Treatment: 7 Constitutional Sitting or standing Blood Pressure is within target range for patient.. Pulse regular and within target range for patient.Marland Kitchen. Respirations regular, non-labored and within target range.. Temperature is normal and within the target range for the patient.Marland Kitchen. Appears in no distress. Notes Wound exam; the wound looks stable to improved. Slight reduction in undermining and depth. I used a #5 curette to remove  thick callus and subcutaneous tissue from around the wound edge hemostasis with a pressure dressing Electronic Signature(s) Signed: 08/27/2019 5:29:15 PM By: Baltazar Najjar MD Entered By: Baltazar Najjar on 08/27/2019  09:20:45 -------------------------------------------------------------------------------- Physician Orders Details Patient Name: Date of Service: KANE, KUSEK 08/27/2019 8:15 AM Medical Record [id number] Patient Account Number: [id number] Date of Birth/Sex: Treating RN: 1961/01/31 (59 y.o. 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 Dressing Change Frequency Wound #1 Right Toe Great Change Dressing every other day. Wound Cleansing Wound #1 Right Toe Great May shower and wash wound with soap and water. Primary Wound Dressing Wound #1 Right Toe Great Silver Collagen - moisten with hydrogel or KY jelly Secondary Dressing Wound #1 Right Toe Great Kerlix/Rolled Gauze - secure with tape Dry Gauze Other: - felt callous pad Off-Loading Wedge shoe to: - right foot Electronic Signature(s) Signed: 08/27/2019 5:29:15 PM By: Baltazar Najjar MD Signed: 08/27/2019 5:59:01 PM By: Zandra Abts RN, BSN Entered By: Zandra Abts on 08/27/2019 09:00:51 -------------------------------------------------------------------------------- Problem List Details Patient Name: Date of Service: JACARRI, GESNER 08/27/2019 8:15 AM Medical Record [id number] Patient Account Number: [id number] Date of Birth/Sex: Treating RN: 1961-03-18 (59 y.o. [name] Primary Care Provider: PATIENT, NO Other Clinician: Referring Provider: Treating  Provider/Extender:Kaydance Bowie, [name] in Treatment: 7 Active Problems ICD-10 Evaluated Encounter Code Description Active Date Today Diagnosis E11.621 Type 2 diabetes mellitus with foot ulcer 07/09/2019 No Yes L97.512 Non-pressure chronic ulcer of other part of right foot 07/09/2019 No Yes with fat layer exposed E11.42 Type 2 diabetes mellitus with diabetic polyneuropathy 07/09/2019 No Yes M21.371 Foot drop, right foot 07/09/2019 No Yes E11.51 Type 2 diabetes mellitus with diabetic peripheral 07/31/2019 No Yes angiopathy without gangrene Inactive Problems Resolved Problems Electronic Signature(s) Signed: 08/27/2019 5:29:15 PM By: Baltazar Najjar MD Entered By: Baltazar Najjar on 08/27/2019 09:18:42 -------------------------------------------------------------------------------- Progress Note Details Patient Name: Date of Service: MAICOL, BOWLAND 08/27/2019 8:15 AM Medical Record [id number] Patient Account Number: [id number] Date of Birth/Sex: Treating RN: October 12, 1960 (59 y.o. [name] Primary Care Provider: PATIENT, NO Other Clinician: Referring Provider: Treating Provider/Extender:Demetres Prochnow, [name] in Treatment: 7 Subjective History of Present Illness (HPI) ADMISSION 07/09/2019 This is a pleasant 59 year old man who referred himself here for second opinion sign a new area on the right plantar first toe and the dorsal part of his right fifth toe. He says he had these in August when he had removed callus from the first toe and then played pool volleyball for about 4 hours. He thinks the bottom of the pool simply caused an abrasion of the toe. The story sounds accurate. He has been going to podiatry for the last several months. Me a picture on his phone from August and the wound is gotten considerably smaller. He states that it started doing well recently when they started collagen. He has been using a surgical shoe to offload. 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 He  had a brace for a long time but now he simply lifts his foot higher to clear but states that most people would not even notice that he had any disability. He is a type 2 diabetes Badik but he has diet-controlled he has lost weight by watching calories. 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 07/16/2019; right fifth toe is closed. Right first toe is smaller. He is using a surgical shoe but he tells me is fairly religious about using his scooter at home. Hopefully this will give him enough offloading to close this. 2/1; right fifth toe dorsally remains closed. Right first toe still not a lot of improvement. Although superficially it looks smaller there is undermining laterally from 6-6 of at least 3 to 4 mm. Also worrisome is that the granulation does not look that healthy. We have been using silver collagen He had his foot x-rayed by his podiatrist but I do not have access to this. I am going to x-ray the right great toe again. Otherwise I am thinking he is going to require a total contact cast probably next week and I discussed this with him today. 2/9; his x-ray of the right foot did not show plain x-ray evidence of osteomyelitis. We have been using silver alginate the wound is not improved looks static. He is going to get a total contact cast today He comes in today telling me that before he left podiatry before he came here he had noninvasive arterial studies that were done by a mobile service and the podiatrist office. They are now trying to arrange I think an angiogram however this would be in [facility]. He asked my opinion on this and I told him that angiograms are done  by several different doctor groups locally and if he is from [place] see a reason to go out of [facility] for any procedure he might need. We will see if we can get the actual angiogram report and I will refer him as needed to either vein and vascular or interventional cardiology 2/12; back for his first obligatory total contact cast change. I did get his arterial studies from [facility]. Biphasic waveforms ABI in the right of 1.16 on the left at 1.14 I am not really sure what was so concerning about this [place] I will simply repeat these studies along with TBI's locally. There should not be any need for him to go out of town to have this evaluation. I am not sure that there is a clinical need 2/16; once again the total contact cast was too loose he has multiple skin excoriations where things were rubbing. He claims to not be on this all that much and is working from home but between the size of his foot the wasting in the distal part of his lower leg and large calfs we just cannot mold the cast in to fit his leg properly.-Changed him to a forefoot off loader but with his foot drop that may not be possible. 2/23; we put him out in a surgical shoe last week as we did not have a forefoot off loader. We have this today. We are using silver alginate. He did not tolerate a total contact cast 3/1; he is in a forefoot offloading  boot. Using silver collagen as of last week. No major change 3/8; he is using a forefoot offloading boot. Silver collagen over the last 2 weeks. Perhaps some reduction in depth. Moreover surface looks healthy Objective Constitutional Sitting or standing Blood Pressure is within target range for patient.. Pulse regular and within target range for patient.Marland Kitchen Respirations regular, non-labored and within target range.. Temperature is normal and within the target range for the patient.Marland Kitchen Appears in no  distress. Vitals Time Taken: 8:00 AM, Height: 78 in, Weight: 285 lbs, BMI: 32.9, Temperature: 98.1 F, Pulse: 71 bpm, Respiratory Rate: 18 breaths/min, Blood Pressure: 130/66 mmHg. General Notes: Wound exam; the wound looks stable to improved. Slight reduction in undermining and depth. I used a #5 curette to remove thick callus and subcutaneous tissue from around the wound edge hemostasis with a pressure dressing Integumentary (Hair, Skin) Wound #1 status is Open. Original cause of wound was Gradually Appeared. The wound is located on the Right Toe Great. The wound measures 1cm length x 0.8cm width x 0.5cm depth; 0.628cm^2 area and 0.314cm^3 volume. There is Fat Layer (Subcutaneous Tissue) Exposed exposed. There is no tunneling or undermining noted. There is a small amount of serosanguineous drainage noted. The wound margin is thickened. There is large (67-100%) red, friable granulation within the wound bed. There is no necrotic tissue within the wound bed. 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 There was a Excisional Skin/Subcutaneous Tissue Debridement with a total area of 0.8 sq cm performed by Maxwell Caul., MD. With the following instrument(s): Curette to remove Viable and Non-Viable tissue/material. Material removed includes Callus and Subcutaneous Tissue and. No specimens were taken. A time out was conducted at 08:57, prior to the start of the procedure. A Minimum amount of bleeding was controlled with Pressure. The  procedure was tolerated well with a pain level of 0 throughout and a pain level of 0 following the procedure. Post Debridement Measurements: 1cm length x 0.8cm width x 0.5cm depth; 0.314cm^3 volume. Character of Wound/Ulcer Post Debridement is improved. Severity of Tissue Post Debridement is: Fat layer exposed. Post procedure Diagnosis Wound #1: Same as Pre-Procedure Plan Follow-up Appointments: Return Appointment in 1 week. Dressing Change Frequency: Wound #1 Right Toe Great: Change Dressing every other day. Wound Cleansing: Wound #1 Right Toe Great: May shower and wash wound with soap and water. Primary Wound Dressing: Wound #1 Right Toe Great: Silver Collagen - moisten with hydrogel or KY jelly Secondary Dressing: Wound #1 Right Toe Great: Kerlix/Rolled Gauze - secure with tape Dry Gauze Other: - felt callous pad Off-Loading: Wedge shoe to: - right foot 1. Continue with silver collagen padded with Kerlix and rolled gauze. He is using a forefoot off loader 2. Because of the shape of his leg related to foot drop, muscle atrophy and the size of his foot we could not use a total contact cast Electronic Signature(s) Signed: 08/27/2019 5:29:15 PM By: Baltazar Najjar MD Entered By: Baltazar Najjar on 08/27/2019 09:21:31 -------------------------------------------------------------------------------- SuperBill Details Patient Name: Date of Service: BRANDI, ARMATO 08/27/2019 Medical Record [id number] Patient Account Number: [id number] Date of Birth/Sex:  Treating RN: 1961-02-16 (59 y.o. [name] Primary Care Provider: PATIENT, NO Other Clinician: Referring Provider: Treating Provider/Extender:Eisa Necaise, [name] in Treatment: 7 Diagnosis Coding ICD-10 Codes Code Description E11.621 Type 2 diabetes mellitus with foot ulcer L97.512 Non-pressure chronic ulcer of other part of right foot with fat layer exposed E11.42 Type 2 diabetes mellitus with diabetic  polyneuropathy M21.371 Foot drop, right foot E11.51 Type 2 diabetes mellitus with diabetic peripheral angiopathy without gangrene Facility Procedures CPT4 Code Description: [id number] 11042 - DEB SUBQ TISSUE 20 SQ CM/< ICD-10 Diagnosis Description E11.621 Type 2 diabetes mellitus with foot ulcer L97.512 Non-pressure chronic ulcer of other part of right foot wi Modifier: th fat layer ex Quantity: 1 posed Physician Procedures CPT4 Code Description: 5409811 11042 - WC PHYS SUBQ TISS 20 SQ CM ICD-10 Diagnosis Description E11.621 Type 2 diabetes mellitus with foot ulcer L97.512 Non-pressure chronic ulcer of other part of right foot w Modifier: ith fat layer e Quantity: 1 xposed Electronic Signature(s) Signed: 08/27/2019 5:29:15 PM By: Baltazar Najjar MD Entered By: Baltazar Najjar on 08/27/2019 09:21:48

## 2019-09-03 ENCOUNTER — Other Ambulatory Visit: Payer: Self-pay

## 2019-09-03 ENCOUNTER — Encounter (HOSPITAL_BASED_OUTPATIENT_CLINIC_OR_DEPARTMENT_OTHER): Payer: 59 | Admitting: Internal Medicine

## 2019-09-03 DIAGNOSIS — E11621 Type 2 diabetes mellitus with foot ulcer: Secondary | ICD-10-CM | POA: Diagnosis not present

## 2019-09-04 NOTE — Progress Notes (Signed)
CLEVESTER, HELZER ([id number]) Visit Report for 09/03/2019 HPI Details Patient Name: Date of Service: Allen Gregory, Allen Gregory 09/03/2019 8:00 AM Medical Record [id number] Patient Account Number: [id number] Date of Birth/Sex: Treating RN: 1960-07-21 (59 y.o. [name] Primary Care Provider: PATIENT, NO Other Clinician: Referring Provider: Treating Provider/Extender:Sidonie Dexheimer, Esperanza Richters in Treatment: 8 History of Present Illness HPI Description: ADMISSION 07/09/2019 This is a pleasant 60 year old man who referred himself here for second opinion sign a new area on the right plantar first toe and the dorsal part of his right fifth toe. He says he had these in August when he had removed callus from the first toe and then played pool volleyball for about 4 hours. He thinks the bottom of the pool simply caused an abrasion of the toe. The story sounds accurate. He has been going to podiatry for the last several months. Me a picture on his phone from August and the wound is gotten considerably smaller. He states that it started doing well recently when they started collagen. He has been using a surgical shoe to offload. 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 He had a brace for a long time but now he simply lifts his foot higher to clear but states that most people would not even notice that he had any disability. He is a type 2 diabetes Badik but he has diet-controlled he has lost weight by watching calories. 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 07/16/2019; right fifth toe is closed. Right first toe is smaller. He is using a surgical shoe but he tells me is fairly religious about using his  scooter at home. Hopefully this will give him enough offloading to close this. 2/1; right fifth toe dorsally remains closed. Right first toe still not a lot of improvement. Although superficially it looks smaller there is undermining laterally from 6-6 of at least 3 to 4 mm. Also worrisome is that the granulation does not look that healthy. We have been using silver collagen He had his foot x-rayed by his podiatrist but I do not have access to this. I am going to x-ray the right great toe again. Otherwise I am thinking he is going to require a total contact cast probably next week and I discussed this with him today. 2/9; his x-ray of the right foot did not show plain x-ray evidence of osteomyelitis. We have been using silver alginate the wound is not improved looks static. He is going to get a total contact cast today He comes in today telling me that before he left podiatry before he came here he had noninvasive arterial studies that were done by a mobile service and the podiatrist office. They are now trying to arrange I think an angiogram however this would be in [facility]. He asked my opinion on this and I told him that angiograms are done by several different doctor groups locally and if he is from [place] see a reason to go out of [facility] for any procedure he might need. We will see if we can get the actual angiogram report and I will refer him as needed to either vein and vascular or interventional cardiology 2/12; back for  his first obligatory total contact cast change. I did get his arterial studies from [facility]. Biphasic waveforms ABI in the right of 1.16 on the left at 1.14 I am not really sure what was so concerning about this [place] I will simply repeat these studies along with TBI's locally. There should not be any need for him to go out of town to have this evaluation. I am not sure  that there is a clinical need 2/16; once again the total contact cast was too loose he has multiple skin excoriations where things were rubbing. He claims to not be on this all that much and is working from home but between the size of his foot the wasting in the distal part of his lower leg and large calfs we just cannot mold the cast in to fit his leg properly.-Changed him to a forefoot off loader but with his foot drop that may not be possible. 2/23; we put him out in a surgical shoe last week as we did not have a forefoot off loader. We have this today. We are using silver alginate. He did not tolerate a total contact cast 3/1; he is in a forefoot offloading boot. Using silver collagen as of last week. No major change 3/8; he is using a forefoot offloading boot. Silver collagen over the last 2 weeks. Perhaps some reduction in depth. Moreover surface looks healthy 3/15; he is a forefoot offloading boot. Silver collagen over the last 3 weeks. There has been reduction in depth. Much less circumferential callus and thick subcutaneous tissue. Electronic Signature(s) Signed: 09/04/2019 10:32:20 AM By: Baltazar Najjar MD Entered By: Baltazar Najjar on 09/03/2019 08:23:40 -------------------------------------------------------------------------------- Physical Exam Details Patient Name: Date of Service: Allen Gregory, Allen Gregory 09/03/2019 8:00 AM Medical Record [id number] Patient Account Number: [id number] Date of Birth/Sex: Treating RN: 01-Sep-1960 (59 y.o. [name] Primary Care Provider: PATIENT, NO Other Clinician: Referring Provider: Treating Provider/Extender:Rickey Sadowski, [name] in Treatment: 8 Cardiovascular Pedal pulses are palpable. Notes Wound exam; the wound looks improved especially in terms of depth. There is still some slight callus and thick surrounding subcutaneous tissue but I think this is much better than in the past. I did not do any mechanical debridement today.  There is no evidence of surrounding infection. He managed with the amount of padding he is putting on the large toe to cause a superficial blister on the second toe. Electronic Signature(s) Signed: 09/04/2019 10:32:20 AM By: Baltazar Najjar MD Entered By: Baltazar Najjar on 09/03/2019 08:24:36 -------------------------------------------------------------------------------- Physician Orders Details Patient Name: Date of Service: KIMANI, HOVIS 09/03/2019 8:00 AM Medical Record [id number] Patient Account Number: [id number] Date of Birth/Sex: Treating RN: 1960/09/20 (59 y.o. 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 Dressing Change Frequency Wound #1 Right Toe Great Change Dressing every other day. Wound Cleansing Wound #1 Right Toe Great May shower and wash wound with soap and water. Primary Wound Dressing Wound #1 Right Toe Great Silver Collagen - moisten with hydrogel or KY jelly  Secondary Dressing Wound #1 Right Toe Great Kerlix/Rolled Gauze - secure with tape Dry Gauze Other: - felt callous pad Off-Loading Wedge shoe to: - right foot Electronic Signature(s) Signed: 09/03/2019 5:44:18 PM By: Zandra Abts RN, BSN Signed: 09/04/2019 10:32:20 AM By: Baltazar Najjar MD Entered By: Zandra Abts on 09/03/2019 08:13:56 -------------------------------------------------------------------------------- Problem List Details Patient  Name: Date of Service: ANTONY, SIAN 09/03/2019 8:00 AM Medical Record [id number] Patient Account Number: [id number] Date of Birth/Sex: Treating RN: [birth year] (59 y.o. [name] Primary Care Provider: PATIENT, NO Other Clinician: Referring Provider: Treating Provider/Extender:Juquan Reznick, [name] in Treatment: 8 Active Problems ICD-10 Evaluated Encounter Code Description Active Date Today Diagnosis E11.621 Type 2 diabetes mellitus with foot ulcer 07/09/2019 No Yes L97.512 Non-pressure chronic ulcer of other part of right foot 07/09/2019 No Yes with fat layer exposed E11.42 Type 2 diabetes mellitus with diabetic polyneuropathy 07/09/2019 No Yes M21.371 Foot drop, right foot 07/09/2019 No Yes E11.51 Type 2 diabetes mellitus with diabetic peripheral 07/31/2019 No Yes angiopathy without gangrene Inactive Problems Resolved Problems Electronic Signature(s) Signed: 09/04/2019 10:32:20 AM By: Baltazar Najjar MD Entered By: Baltazar Najjar on 09/03/2019 08:22:40 -------------------------------------------------------------------------------- Progress Note Details Patient Name: Date of Service: Allen Gregory, Allen Gregory 09/03/2019 8:00 AM Medical Record [id number] Patient Account Number: [id number] Date of Birth/Sex: Treating RN: 02-20-1961 (59 y.o. [name] Primary Care Provider: PATIENT, NO Other Clinician: Referring Provider: Treating Provider/Extender:Ezra Marquess, [name] in Treatment: 8 Subjective History of Present Illness (HPI) ADMISSION 07/09/2019 This is a pleasant 59 year old man who referred himself here for second opinion sign a new area on the right plantar first toe and the dorsal part of his right fifth toe. He says he had these in August when he had removed callus from the first toe and then played pool volleyball for about 4 hours. He thinks the bottom of the pool simply caused an abrasion of the toe. The story sounds accurate. He has been going to  podiatry for the last several months. Me a picture on his phone from August and the wound is gotten considerably smaller. He states that it started doing well recently when they started collagen. He has been using a surgical shoe to offload. 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 He had a brace for a long time but now he simply lifts his foot higher to clear but states that most people would not even notice that he had any disability. He is a type 2 diabetes Badik but he has diet-controlled he has lost weight by watching calories. 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 07/16/2019; right fifth toe is closed. Right first toe is smaller. He is using a surgical shoe but he tells me is fairly religious about using his scooter at home. Hopefully this will give him enough offloading to close this. 2/1; right fifth toe dorsally remains closed. Right first toe still not a lot of improvement. Although superficially it looks smaller there is undermining laterally from 6-6 of at least 3 to 4 mm. Also worrisome is that the granulation does not look that healthy. We have been using silver collagen He had his foot x-rayed by his podiatrist but I do not have access to this. I am  going to x-ray the right great toe again. Otherwise I am thinking he is going to require a total contact cast probably next week and I discussed this with him today. 2/9; his x-ray of the right foot did not show plain x-ray evidence of osteomyelitis. We have been using silver alginate the wound is not improved looks static. He is going to get a total contact cast today He comes in today telling me that before he left podiatry before he  came here he had noninvasive arterial studies that were done by a mobile service and the podiatrist office. They are now trying to arrange I think an angiogram however this would be in [facility]. He asked my opinion on this and I told him that angiograms are done by several different doctor groups locally and if he is from [place] I cannot see a reason to go out of [facility] for any procedure he might need. We will see if we can get the actual angiogram report and I will refer him as needed to either vein and vascular or interventional cardiology 2/12; back for his first obligatory total contact cast change. I did get his arterial studies from [facility]. Biphasic waveforms ABI in the right of 1.16 on the left at 1.14 I am not really sure what was so concerning about this that [place] I will simply repeat these studies along with TBI's locally. There should not be any need for him to go out of town to have this evaluation. I am not sure that there is a clinical need 2/16; once again the total contact cast was too loose he has multiple skin excoriations where things were rubbing. He claims to not be on this all that much and is working from home but between the size of his foot the wasting in the distal part of his lower leg and large calfs we just cannot mold the cast in to fit his leg properly.-Changed him to a forefoot off loader but with his foot drop that may not be possible. 2/23; we put him out in a surgical shoe last week as we did not have a forefoot off loader. We have this today. We are using silver alginate. He did not tolerate a total contact cast 3/1; he is in a forefoot offloading boot. Using silver collagen as of last week. No major change 3/8; he is using a forefoot offloading boot. Silver collagen over the last 2 weeks. Perhaps some reduction in depth. Moreover surface looks healthy 3/15; he is a  forefoot offloading boot. Silver collagen over the last 3 weeks. There has been reduction in depth. Much less circumferential callus and thick subcutaneous tissue. Objective Constitutional Vitals Time Taken: 7:56 AM, Height: 78 in, Weight: 285 lbs, BMI: 32.9, Temperature: 98.4 F, Pulse: 78 bpm, Respiratory Rate: 18 breaths/min, Blood Pressure: 131/61 mmHg. Cardiovascular Pedal pulses are palpable. General Notes: Wound exam; the wound looks improved especially in terms of depth. There is still some slight callus and thick surrounding subcutaneous tissue but I think this is much better than in the past. I did not do any mechanical debridement today. There is no evidence of surrounding infection. He managed with the amount of padding he is putting on the large toe to cause a superficial blister on the second toe. Integumentary (Hair, Skin) Wound #1 status is Open. Original cause of wound was Gradually Appeared. The wound is located on  the Right Toe Great. The wound measures 0.5cm length x 0.6cm width x 0.4cm depth; 0.236cm^2 area and 0.094cm^3 volume. There is Fat Layer (Subcutaneous Tissue) Exposed exposed. There is no tunneling or undermining noted. There is a small amount of serosanguineous drainage noted. The wound margin is thickened. There is large (67-100%) red granulation within the wound bed. There is no necrotic tissue within the wound bed. 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 Dressing Change Frequency: Wound #1 Right Toe Great: Change Dressing every other day. Wound Cleansing: Wound #1 Right Toe Great: May shower and wash wound with soap and  water. Primary Wound Dressing: Wound #1 Right Toe Great: Silver Collagen - moisten with hydrogel or KY jelly Secondary Dressing: Wound #1 Right Toe Great: Kerlix/Rolled Gauze - secure with tape Dry Gauze Other: - felt callous pad Off-Loading: Wedge shoe to: - right foot 1. No change in the primary dressing which is a collagen-based dressings 2. I did not do any mechanical debridement of the surrounding callus and subcutaneous tissue I am hopeful that this is closing in. Surface area dimensions are better. 3. I have counseled him to continue to vigorously and religiously offload this Electronic Signature(s) Signed: 09/04/2019 10:32:20 AM By: Baltazar Najjarobson, Daquarius Dubeau MD Entered By: Baltazar Najjarobson, Robin Pafford on 09/03/2019 08:25:27 -------------------------------------------------------------------------------- SuperBill Details Patient Name: Date of Service: Kizzie FurnishROSOL, Stark 09/03/2019 Medical Record [id number] Patient Account Number: [id number] Date of Birth/Sex: Treating RN: 01/08/1961 (59 y.o. 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 M21.371 Foot drop, right foot E11.51 Type 2 diabetes mellitus with diabetic peripheral angiopathy without gangrene Facility Procedures CPT4 Code: [id number] Description: 99213 - WOUND CARE VISIT-LEV 3 EST PT Modifier: Quantity: 1 Physician Procedures CPT4 Code Description: [id number] 99212 - WC PHYS LEVEL 2 - EST PT ICD-10 Diagnosis Description E11.621 Type 2 diabetes mellitus with foot ulcer L97.512 Non-pressure chronic ulcer of other part of right foot Modifier: with fat layer e Quantity: 1 xposed Electronic  Signature(s) Signed: 09/03/2019 5:44:18 PM By: Zandra AbtsLynch, Shatara RN, BSN Signed: 09/04/2019 10:32:20 AM By: Baltazar Najjarobson, Tecora Eustache MD Entered By: Zandra AbtsLynch, Shatara on 09/03/2019 08:44:25

## 2019-09-10 ENCOUNTER — Other Ambulatory Visit (HOSPITAL_COMMUNITY)
Admission: RE | Admit: 2019-09-10 | Discharge: 2019-09-10 | Disposition: A | Discharge status: Home / Self Care | Payer: 59 | Source: Other Acute Inpatient Hospital | Attending: Internal Medicine | Admitting: Internal Medicine

## 2019-09-10 ENCOUNTER — Encounter (HOSPITAL_BASED_OUTPATIENT_CLINIC_OR_DEPARTMENT_OTHER): Payer: 59 | Admitting: Internal Medicine

## 2019-09-10 ENCOUNTER — Other Ambulatory Visit: Payer: Self-pay

## 2019-09-10 DIAGNOSIS — E11621 Type 2 diabetes mellitus with foot ulcer: Secondary | ICD-10-CM | POA: Insufficient documentation

## 2019-09-10 NOTE — Progress Notes (Signed)
MARLAND, REINE ([id number]) Visit Report for 09/10/2019 Debridement Details Patient Name: Date of Service: ASHLIN, HIDALGO 09/10/2019 8:00 AM Medical Record [id number] Patient Account Number: [id number] Date of Birth/Sex: Treating RN: 01/04/1961 (59 y.o. [name] Primary Care Provider: PATIENT, NO Other Clinician: Referring Provider: Treating Provider/Extender:Jolisa Intriago, [name] in Treatment: 9 Debridement Performed for Wound #1 Right,Plantar Toe Great Assessment: Performed By: Physician Maxwell Caul., MD Debridement Type: Debridement Severity of Tissue Pre Fat layer exposed Debridement: Level of Consciousness (Pre- Awake and Alert procedure): Pre-procedure Verification/Time Out Taken: Yes - 08:37 Start Time: 08:37 Pain Control: Lidocaine 5% topical ointment Total Area Debrided (L x W): 0.5 (cm) x 0.4 (cm) = 0.2 (cm) Tissue and other material Viable, Non-Viable, Callus, Subcutaneous debrided: Level: Skin/Subcutaneous Tissue Debridement Description: Excisional Instrument: Curette Bleeding: Minimum Hemostasis Achieved: Pressure End Time: 08:39 Procedural Pain: 0 Post Procedural Pain: 0 Response to Treatment: Procedure was tolerated well Level of Consciousness Awake and Alert (Post-procedure): Post Debridement Measurements of Total Wound Length: (cm) 0.5 Width: (cm) 0.4 Depth: (cm) 0.5 Volume: (cm) 0.079 Character of Wound/Ulcer Post Improved Debridement: Severity of Tissue Post Debridement: Fat layer exposed Post Procedure Diagnosis Same as Pre-procedure Electronic Signature(s) Signed: 09/10/2019 5:18:21 PM By: Zandra Abts RN, BSN Signed: 09/10/2019 5:34:39 PM By: Baltazar Najjar MD Entered By: Baltazar Najjar on 09/10/2019 08:48:17 -------------------------------------------------------------------------------- Debridement Details Patient Name: Date of Service: MUSTAPHA, COLSON 09/10/2019 8:00 AM Medical Record [id number] Patient  Account Number: [id number] Date of Birth/Sex: Treating RN: Apr 12, 1961 (59 y.o. [name] Primary Care Provider: PATIENT, NO Other Clinician: Referring Provider: Treating Provider/Extender:Ludella Pranger, [name] in Treatment: 9 Debridement Performed for Wound #3 Right,Distal Toe Great Assessment: Performed By: Physician Maxwell Caul., MD Debridement Type: Debridement Severity of Tissue Pre Fat layer exposed Debridement: Level of Consciousness (Pre- Awake and Alert procedure): Pre-procedure Yes - 08:37 Verification/Time Out Taken: Start Time: 08:37 Pain Control: Lidocaine 5% topical ointment Total Area Debrided (L x W): 1.7 (cm) x 2 (cm) = 3.4 (cm) Tissue and other material Non-Viable, Skin: Epidermis debrided: Level: Skin/Epidermis Debridement Description: Selective/Open Wound Instrument: Blade, Forceps Specimen: Swab, Number of Specimens Taken: 1 Bleeding: Minimum Hemostasis Achieved: Pressure End Time: 08:39 Procedural Pain: 0 Post Procedural Pain: 0 Response to Treatment: Procedure was tolerated well Level of Consciousness Awake and Alert (Post-procedure): Post Debridement Measurements of Total Wound Length: (cm) 1.7 Width: (cm) 2 Depth: (cm) 0.1 Volume: (cm) 0.267 Character of Wound/Ulcer Post Improved Debridement: Severity of Tissue Post Debridement: Fat layer exposed Post Procedure Diagnosis Same as Pre-procedure Electronic Signature(s) Signed: 09/10/2019 5:18:21 PM By: Zandra Abts RN, BSN Signed: 09/10/2019 5:34:39 PM By: Baltazar Najjar MD Entered By: Baltazar Najjar on 09/10/2019 08:48:25 -------------------------------------------------------------------------------- HPI Details Patient Name: Date of Service: KALIK, HOARE 09/10/2019 8:00 AM Medical Record [id number] Patient Account Number: [id number] Date of Birth/Sex: Treating RN: July 15, 1960 (59 y.o. [name] Primary Care Provider: PATIENT, NO Other  Clinician: Referring Provider: Treating Provider/Extender:Jearldean Gutt, [name] in Treatment: 9 History of Present Illness HPI Description: ADMISSION 07/09/2019 This is a pleasant 59 year old man who referred himself here for second opinion sign a new area on the right plantar first toe and the dorsal part of his right fifth toe. He says he had these in August when he had removed callus from the first toe and then played pool volleyball for about 4 hours. He thinks the bottom of the pool simply caused an abrasion of the toe. The story sounds accurate. He has been going to podiatry for the last several months.  Me a picture on his phone from August and the wound is gotten considerably smaller. He states that it started doing well recently when they started collagen. He has been using a surgical shoe to offload. 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 He had a brace for a long time but now he simply lifts his foot higher to clear but states that most people would not even notice that he had any disability. He is a type 2 diabetes Badik but he has diet-controlled he has lost weight by watching calories. 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 07/16/2019; right fifth toe is closed. Right first toe is smaller. He is using a surgical shoe but he tells me is fairly religious about using his scooter at home. Hopefully this will give him enough offloading to close this. 2/1; right fifth toe dorsally remains closed. Right first toe still not a lot of improvement. Although superficially it looks smaller there is undermining laterally from 6-6 of at least 3 to 4 mm. Also worrisome is that the granulation does  not look that healthy. We have been using silver collagen He had his foot x-rayed by his podiatrist but I do not have access to this. I am going to x-ray the right great toe again. Otherwise I am thinking he is going to require a total contact cast probably next week and I discussed this with him today. 2/9; his x-ray of the right foot did not show plain x-ray evidence of osteomyelitis. We have been using silver alginate the wound is not improved looks static. He is going to get a total contact cast today He comes in today telling me that before he left podiatry before he came here he had noninvasive arterial studies that were done by a mobile service and the podiatrist office. They are now trying to arrange I think an angiogram however [facility]. He asked my opinion on this and I told him that angiograms are done by several different doctor groups locally and if he is from [place] I cannot see a reason to go out of [facility] for any procedure he might need. We will see if we can get the actual angiogram report and I will refer him as needed to either vein and vascular or interventional cardiology 2/12; back for his first obligatory total contact cast change. I did get his arterial studies from [facility]. Biphasic waveforms ABI in the right of 1.16 on the left at 1.14 I am not really sure what was so concerning about this [place] I will simply repeat these studies along with TBI's locally. There should not be any need for him to go out of town to have this evaluation. I am not sure that there is a clinical need 2/16; once again the total contact cast was too loose he has multiple skin excoriations where things were rubbing. He claims to not be on this all that much and is working from home but between the size of his foot the wasting in the distal part of his lower leg and large calfs we  just  cannot mold the cast in to fit his leg properly.-Changed him to a forefoot off loader but with his foot drop that may not be possible. 2/23; we put him out in a surgical shoe last week as we did not have a forefoot off loader. We have this today. We are using silver alginate. He did not tolerate a total contact cast 3/1; he is in a forefoot offloading boot. Using silver collagen as of last week. No major change 3/8; he is using a forefoot offloading boot. Silver collagen over the last 2 weeks. Perhaps some reduction in depth. Moreover surface looks healthy 3/15; he is a forefoot offloading boot. Silver collagen over the last 3 weeks. There has been reduction in depth. Much less circumferential callus and thick subcutaneous tissue. 3/22; his original wound on the plantar right great toe is somewhat improved. However he arrives in clinic today with a completely denuded ulcer on the tip of the toe distal to the original wound. More concerning is there is loss of surface epithelium over a wide area around this even dorsally on the toe. He states that he last wrapped the area on Saturday. He was up at [facility] walking this weekend otherwise he had not done anything different. Noticeable for the fact that his forefoot offloading shoe was not in good condition fact that split open this could have had something to do with this but I have no doubt that there is also some degree of infection/cellulitis Electronic Signature(s) Signed: 09/10/2019 5:34:39 PM By: Baltazar Najjar MD Entered By: Baltazar Najjar on 09/10/2019 08:49:42 -------------------------------------------------------------------------------- Physical Exam Details Patient Name: Date of Service: WARREN, LINDAHL 09/10/2019 8:00 AM Medical Record [id number] Patient Account Number: [id number] Date of Birth/Sex: Treating RN: [birth year] (59 y.o. [name] Primary Care Provider: PATIENT, NO Other Clinician: Referring Provider:  Treating Provider/Extender:Neyra Pettie, [name] in Treatment: 9 Constitutional Patient is hypertensive.. Pulse regular and within target range for patient.Marland Kitchen Respirations regular, non-labored and within target range.. Temperature is normal and within the target range for the patient.Marland Kitchen Appears in no distress. Respiratory work of breathing is normal. Cardiovascular Pedal pulses palpable.. Integumentary (Hair, Skin) No systemic cutaneous issues are seen. Notes Wound exam The real concerning area is the new area on the tip of the toe. Somewhat necrotic on the medial aspect of the toe. This was cultured debrided with pickups and #15 scalpel removing dead skin and subcutaneous tissue. Also noted the denuded surface epithelium on the tip of the toe even distally on extending to the nailbed. The original wound actually does not look too bad. Using a #3 curette debrided of surrounding callus and subcutaneous tissue Electronic Signature(s) Signed: 09/10/2019 5:34:39 PM By: Baltazar Najjar MD Entered By: Baltazar Najjar on 09/10/2019 08:52:22 -------------------------------------------------------------------------------- Physician Orders Details Patient Name: Date of Service: IRVING, BLOOR 09/10/2019 8:00 AM Medical Record [id number] Patient Account Number: [id number] Date of Birth/Sex: Treating RN: [birth year] (59 y.o. 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 Dressing Change Frequency Wound #1 Right,Plantar Toe Great Change Dressing every other day. Wound #3 Right,Distal  Toe Great Change Dressing every other day. Wound Cleansing May shower and wash wound with soap and water. Primary Wound Dressing Wound #1 Right,Plantar Toe Great Silver Collagen - moisten with hydrogel or KY jelly Wound #3 Right,Distal Toe Great Calcium Alginate with Silver Secondary Dressing Wound #1 Right,Plantar Toe Great Foam - foam donut Kerlix/Rolled Gauze - secure with tape Dry Gauze Wound #3 Right,Distal Toe Great Foam - foam donut Kerlix/Rolled Gauze - secure with tape Dry Gauze Off-Loading Wedge shoe to: - right foot Laboratory Bacteria identified in Unspecified specimen by Anaerobe culture (MICRO) - right distal great toe - (ICD10 E11.621 - Type 2 diabetes mellitus with foot ulcer) LOINC Code: 635-3 Convenience Name: Anerobic culture Patient Medications Allergies: lisinopril Notifications Medication Indication Start End doxycycline monohydrate wound infection 09/10/2019 DOSE oral 100 mg capsule - 1 capsule oral bid for 7 days Electronic Signature(s) Signed: 09/10/2019 8:58:17 AM By: Baltazar Najjar MD Entered By: Baltazar Najjar on 09/10/2019 08:58:15 -------------------------------------------------------------------------------- Problem List Details Patient Name: Date of Service: JOSEANDRES, MAZER 09/10/2019 8:00 AM Medical Record [id number] Patient Account Number: [id number] Date of Birth/Sex: Treating RN: Jul 09, 1960 (59 y.o. [name] Primary Care Provider: PATIENT, NO Other Clinician: Referring Provider: Treating Provider/Extender:Cain Fitzhenry, [name] in Treatment: 9 Active Problems ICD-10 Evaluated Encounter Code Description Active Date Today Diagnosis E11.621 Type 2 diabetes mellitus with foot ulcer 07/09/2019 No Yes L97.512 Non-pressure chronic ulcer of other part of right foot 07/09/2019 No Yes with fat  layer exposed E11.42 Type 2 diabetes mellitus with diabetic polyneuropathy 07/09/2019 No Yes M21.371 Foot drop, right foot 07/09/2019 No Yes L03.031 Cellulitis of right toe 09/10/2019 No Yes Inactive Problems ICD-10 Code Description Active Date Inactive Date E11.51 Type 2 diabetes mellitus with diabetic peripheral angiopathy 07/31/2019 07/31/2019 without gangrene Resolved Problems Electronic Signature(s) Signed: 09/10/2019 5:34:39 PM By: Baltazar Najjar MD Entered By: Baltazar Najjar on 09/10/2019 08:47:59 -------------------------------------------------------------------------------- Progress Note Details Patient Name: Date of Service: JADARIUS, COMMONS 09/10/2019 8:00 AM Medical Record [id number] Patient Account Number: [id number] Date of Birth/Sex: Treating RN: July 15, 1960 (59 y.o. [name] Primary Care Provider: PATIENT, NO Other Clinician: Referring Provider: Treating Provider/Extender:Shaquil Aldana, [name] in Treatment: 9 Subjective History of Present Illness (HPI) ADMISSION 07/09/2019 This is a pleasant 59 year old man who referred himself here for second opinion sign a new area on the right plantar first toe and the dorsal part of his right fifth toe. He says he had these in August when he had removed callus from the first toe and then played pool volleyball for about 4 hours. He thinks the bottom of the pool simply caused an abrasion of the toe. The story sounds accurate. He has been going to podiatry for the last several months. Me a picture on his phone from August and the wound is gotten considerably smaller. He states that it started doing well recently when they started collagen. He has been using a surgical shoe to offload. 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 He had a brace for a long time but now he simply  lifts his foot higher to clear but states that most people would not even notice that he had any disability. He is a type 2 diabetes Badik but he has diet-controlled he has lost weight by watching calories. Past medical history type 2 diabetes diet controlled, right foot drop is noted, right total hip replacement  x2, history of PE on Xarelto chronically ABI in our clinic was 1.03 on the right. 07/16/2019; right fifth toe is closed. Right first toe is smaller. He is using a surgical shoe but he tells me is fairly religious about using his scooter at home. Hopefully this will give him enough offloading to close this. 2/1; right fifth toe dorsally remains closed. Right first toe still not a lot of improvement. Although superficially it looks smaller there is undermining laterally from 6-6 of at least 3 to 4 mm. Also worrisome is that the granulation does not look that healthy. We have been using silver collagen He had his foot x-rayed by his podiatrist but I do not have access to this. I am going to x-ray the right great toe again. Otherwise I am thinking he is going to require a total contact cast probably next week and I discussed this with him today. 2/9; his x-ray of the right foot did not show plain x-ray evidence of osteomyelitis. We have been using silver alginate the wound is not improved looks static. He is going to get a total contact cast today He comes in today telling me that before he left podiatry before he came here he had noninvasive arterial studies that were done by a mobile service and the podiatrist office. They are now trying to arrange I think an angiogram however this would be in [facility]. He asked my opinion on this and I told him that angiograms are done by several different doctor groups locally and if he is from [place] see a reason to go out of [facility] for any procedure he might need. We will see if we can get the actual angiogram report and  I will refer him as needed to either vein and vascular or interventional cardiology 2/12; back for his first obligatory total contact cast change. I did get his arterial studies from [facility]. Biphasic waveforms ABI in the right of 1.16 on the left at 1.14 I am not really sure what was so concerning about this [place] I will simply repeat these studies along with TBI's locally. There should not be any need for him to go out of town to have this evaluation. I am not sure that there is a clinical need 2/16; once again the total contact cast was too loose he has multiple skin excoriations where things were rubbing. He claims to not be on this all that much and is working from home but between the size of his foot the wasting in the distal part of his lower leg and large calfs we just cannot mold the cast in to fit his leg properly.-Changed him to a forefoot off loader but with his foot drop that may not be possible. 2/23; we put him out in a surgical shoe last week as we did not have a forefoot off loader. We have this today. We are using silver alginate. He did not tolerate a total contact cast 3/1; he is in a forefoot offloading boot. Using silver collagen as of last week. No major change 3/8; he is using a forefoot offloading boot. Silver collagen over the last 2 weeks. Perhaps some reduction in depth. Moreover surface looks healthy 3/15; he is a forefoot offloading boot. Silver collagen over the last 3 weeks. There has been reduction in depth. Much less circumferential callus and thick subcutaneous tissue. 3/22; his original  wound on the plantar right great toe is somewhat improved. However he arrives in clinic today with a completely denuded ulcer on the tip of the toe distal to the original wound. More concerning is there is loss of surface epithelium over a wide area around this even dorsally on the toe. He states that he last  wrapped the area on Saturday. He was up at [facility] walking this weekend otherwise he had not done anything different. Noticeable for the fact that his forefoot offloading shoe was not in good condition fact that split open this could have had something to do with this but I have no doubt that there is also some degree of infection/cellulitis Objective Constitutional Patient is hypertensive.. Pulse regular and within target range for patient.Marland Kitchen Respirations regular, non-labored and within target range.. Temperature is normal and within the target range for the patient.Marland Kitchen Appears in no distress. Vitals Time Taken: 8:01 AM, Height: 78 in, Weight: 285 lbs, BMI: 32.9, Temperature: 98.2 F, Pulse: 83 bpm, Respiratory Rate: 18 breaths/min, Blood Pressure: 142/65 mmHg. Respiratory work of breathing is normal. Cardiovascular Pedal pulses palpable.. General Notes: Wound exam ooThe real concerning area is the new area on the tip of the toe. Somewhat necrotic on the medial aspect of the toe. This was cultured debrided with pickups and #15 scalpel removing dead skin and subcutaneous tissue. Also noted the denuded surface epithelium on the tip of the toe even distally on extending to the nailbed. ooThe original wound actually does not look too bad. Using a #3 curette debrided of surrounding callus and subcutaneous tissue Integumentary (Hair, Skin) No systemic cutaneous issues are seen. Wound #1 status is Open. Original cause of wound was Gradually Appeared. The wound is located on the [employer]. The wound measures 0.5cm length x 0.4cm width x 0.5cm depth; 0.157cm^2 area and 0.079cm^3 volume. There is Fat Layer (Subcutaneous Tissue) Exposed exposed. There is no tunneling or undermining noted. There is a small amount of serosanguineous drainage noted. The wound margin is thickened. There is medium (34-66%) red granulation within the wound bed. There is a medium (34-66%) amount of  necrotic tissue within the wound bed including Adherent Slough. Wound #3 status is Open. Original cause of wound was Blister. The wound is located on the Right,Distal [employer]. The wound measures 1.7cm length x 2cm width x 0.1cm depth; 2.67cm^2 area and 0.267cm^3 volume. There is Fat Layer (Subcutaneous Tissue) Exposed exposed. There is no tunneling or undermining noted. There is a medium amount of serosanguineous drainage noted. There is medium (34-66%) pink, pale granulation within the wound bed. There is a medium (34-66%) amount of necrotic tissue within the wound bed. 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 There was a Excisional Skin/Subcutaneous Tissue Debridement with a total area of 0.2 sq cm performed by Maxwell Caul., MD. With the following instrument(s): Curette to remove Viable and Non-Viable tissue/material. Material removed includes Callus and Subcutaneous Tissue and after achieving pain control using Lidocaine 5% topical ointment. A time out was conducted at 08:37, prior to the start of the procedure. A Minimum amount of bleeding was controlled with Pressure. The  procedure was tolerated well with a pain level of 0 throughout and a pain level of 0 following the procedure. Post Debridement Measurements: 0.5cm length x 0.4cm width x 0.5cm depth; 0.079cm^3 volume. Character of Wound/Ulcer Post Debridement is improved. Severity of Tissue Post Debridement is: Fat layer exposed. Post procedure Diagnosis Wound #1: Same as Pre-Procedure Wound #3 Pre-procedure  diagnosis of Wound #3 is a Diabetic Wound/Ulcer of the Lower Extremity located on the Right,Distal Toe Great .Severity of Tissue Pre Debridement is: Fat layer exposed. There was a Selective/Open Wound Skin/Epidermis Debridement with a total area of 3.4 sq cm performed by Maxwell Caul., MD. With the following instrument(s): Blade, and Forceps to remove Non-Viable tissue/material. Material removed includes Skin: Epidermis after achieving pain control using Lidocaine 5% topical ointment. 1 specimen was taken by a Swab and sent to the lab per facility protocol. A time out was conducted at 08:37, prior to the start of the procedure. A Minimum amount of bleeding was controlled with Pressure. The procedure was tolerated well with a pain level of 0 throughout and a pain level of 0 following the procedure. Post Debridement Measurements: 1.7cm length x 2cm width x 0.1cm depth; 0.267cm^3 volume. Character of Wound/Ulcer Post Debridement is improved. Severity of Tissue Post Debridement is: Fat layer exposed. Post procedure Diagnosis Wound #3: Same as Pre-Procedure Plan Follow-up Appointments: Return Appointment in 1 week. Dressing Change Frequency: Wound #1 Right,Plantar Toe Great: Change Dressing every other day. Wound #3 Right,Distal Toe Great: Change Dressing every other day. Wound Cleansing: May shower and wash wound with soap and water. Primary Wound Dressing: Wound #1 Right,Plantar Toe Great: Silver Collagen - moisten with hydrogel or KY jelly Wound #3 Right,Distal Toe Great: Calcium Alginate with Silver Secondary Dressing: Wound #1 Right,Plantar Toe Great: Foam - foam donut Kerlix/Rolled Gauze - secure with tape Dry Gauze Wound #3 Right,Distal Toe Great: Foam - foam donut Kerlix/Rolled Gauze - secure with tape Dry Gauze Off-Loading: Wedge shoe to: - right foot Laboratory ordered were: Anerobic culture - right distal great toe The following medication(s) was prescribed:  doxycycline monohydrate oral 100 mg capsule 1 capsule oral bid for 7 days for wound infection starting 09/10/2019 #1 new wound at the tip of the same toe. There is enough present on exam here to be very suspicious about cellulitis I am going to give him 7 days of empiric doxycycline. A culture post debridement was done 2. His original wound looks satisfactory 3. This could have been partially caused by the poor condition of his forefoot offloading shoe Electronic Signature(s) Signed: 09/10/2019 8:58:41 AM By: Baltazar Najjar MD Entered By: Baltazar Najjar on 09/10/2019 08:58:41 -------------------------------------------------------------------------------- SuperBill Details Patient Name: Date of Service: TANISH, PRIEN 09/10/2019 Medical Record [id number] Patient Account Number: [id number] Date of Birth/Sex: Treating RN: [birth year] (59 y.o. 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 M21.371 Foot drop, right foot L03.031 Cellulitis of right toe Facility Procedures CPT4 Code Description: [id number] 11042 - DEB SUBQ TISSUE 20 SQ CM/< ICD-10 Diagnosis Description L97.512 Non-pressure chronic ulcer of other part of right foot with f Modifier: at layer ex Quantity: 1 posed CPT4 Code Description: [id number] 97597 - DEBRIDE WOUND 1ST 20 SQ CM OR < ICD-10 Diagnosis Description L97.512 Non-pressure  chronic ulcer of other part of right foot with f Modifier: 59 at layer ex Quantity: 1 posed Physician Procedures CPT4 Code Description: [id number] 11042 - WC PHYS SUBQ TISS 20 SQ CM ICD-10 Diagnosis Description L97.512 Non-pressure chronic ulcer of  other part of right foot with f Modifier: at layer ex Quantity: 1 posed CPT4 Code Description: [id number] 97597 - WC PHYS DEBR WO ANESTH 20 SQ CM ICD-10 Diagnosis Description L97.512 Non-pressure chronic ulcer of other part of right foot with f Modifier: 59 at layer ex Quantity: 1 posed Electronic Signature(s) Signed: 09/10/2019 5:34:39 PM By: Baltazar Najjar MD Entered By: Baltazar Najjar on 09/10/2019 08:58:50

## 2019-09-14 LAB — AEROBIC CULTURE W GRAM STAIN (SUPERFICIAL SPECIMEN): Gram Stain: NONE SEEN

## 2019-09-17 ENCOUNTER — Encounter (HOSPITAL_BASED_OUTPATIENT_CLINIC_OR_DEPARTMENT_OTHER): Payer: 59 | Admitting: Internal Medicine

## 2019-09-17 ENCOUNTER — Other Ambulatory Visit: Payer: Self-pay

## 2019-09-17 DIAGNOSIS — E11621 Type 2 diabetes mellitus with foot ulcer: Secondary | ICD-10-CM | POA: Diagnosis not present

## 2019-09-17 NOTE — Progress Notes (Signed)
NOLLAN, MULDROW ([id number]) Visit Report for 09/17/2019 HPI Details Patient Name: Date of Service: Allen Gregory, Allen Gregory 09/17/2019 8:00 AM Medical Record [id number] Patient Account Number: [id number] Date of Birth/Sex: Treating RN: Sep 21, 1960 (59 y.o. [name] Primary Care Provider: PATIENT, NO Other Clinician: Referring Provider: Treating Provider/Extender:Antonique Langford, [name] in Treatment: 10 History of Present Illness HPI Description: ADMISSION 07/09/2019 This is a pleasant 59 year old man who referred himself here for second opinion sign a new area on the right plantar first toe and the dorsal part of his right fifth toe. He says he had these in August when he had removed callus from the first toe and then played pool volleyball for about 4 hours. He thinks the bottom of the pool simply caused an abrasion of the toe. The story sounds accurate. He has been going to podiatry for the last several months. Me a picture on his phone from August and the wound is gotten considerably smaller. He states that it started doing well recently when they started collagen. He has been using a surgical shoe to offload. 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 He had a brace for a long time but now he simply lifts his foot higher to clear but states that most Allen Gregory would not even notice that he had any disability. He is a type 2 diabetes Badik but he has diet-controlled he has lost weight by watching calories. 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 07/16/2019; right fifth toe is closed. Right first toe is smaller. He is using a surgical shoe but he tells me is fairly religious about using his  scooter at home. Hopefully this will give him enough offloading to close this. 2/1; right fifth toe dorsally remains closed. Right first toe still not a lot of improvement. Although superficially it looks smaller there is undermining laterally from 6-6 of at least 3 to 4 mm. Also worrisome is that the granulation does not look that healthy. We have been using silver collagen He had his foot x-rayed by his podiatrist but I do not have access to this. I am going to x-ray the right great toe again. Otherwise I am thinking he is going to require a total contact cast probably next week and I discussed this with him today. 2/9; his x-ray of the right foot did not show plain x-ray evidence of osteomyelitis. We have been using silver alginate the wound is not improved looks static. He is going to get a total contact cast today He comes in today telling me that before he left podiatry before he came here he had noninvasive arterial studies that were done by a mobile service and the podiatrist office. They are now trying to arrange I think an angiogram however this would be in [facility]. He asked my opinion on this and I told him that angiograms are done by several different doctor groups locally and if he is from [place] I cannot see a reason to go out of [facility] for any procedure he might need. We will see if we can get the actual angiogram report and I will refer him as needed to either vein and vascular or interventional cardiology 2/12; back for  his first obligatory total contact cast change. I did get his arterial studies from in [facility]. Biphasic waveforms ABI in the right of 1.16 on the left at 1.14 I am not really sure what was so concerning about this [place] I will simply repeat these studies along with TBI's locally. There should not be any need for him to go out of town to have this evaluation. I am not sure  that there is a clinical need 2/16; once again the total contact cast was too loose he has multiple skin excoriations where things were rubbing. He claims to not be on this all that much and is working from home but between the size of his foot the wasting in the distal part of his lower leg and large calfs we just cannot mold the cast in to fit his leg properly.-Changed him to a forefoot off loader but with his foot drop that may not be possible. 2/23; we put him out in a surgical shoe last week as we did not have a forefoot off loader. We have this today. We are using silver alginate. He did not tolerate a total contact cast 3/1; he is in a forefoot offloading boot. Using silver collagen as of last week. No major change 3/8; he is using a forefoot offloading boot. Silver collagen over the last 2 weeks. Perhaps some reduction in depth. Moreover surface looks healthy 3/15; he is a forefoot offloading boot. Silver collagen over the last 3 weeks. There has been reduction in depth. Much less circumferential callus and thick subcutaneous tissue. 3/22; his original wound on the plantar right great toe is somewhat improved. However he arrives in clinic today with a completely denuded ulcer on the tip of the toe distal to the original wound. More concerning is there is loss of surface epithelium over a wide area around this even dorsally on the toe. He states that he last wrapped the area on Saturday. He was up at [facility] walking this weekend otherwise he had not done anything different. Noticeable for the fact that his forefoot offloading shoe was not in good condition fact that split open this could have had something to do with this but I have no doubt that there is also some degree of infection/cellulitis 3/29; patient came to the clinic last week with a new wound on the tip of the right great toe. This looked infected there was epithelial loss extending to the base of the first toe dorsally on the  right. I gave him empiric doxycycline. Culture I did showed methicillin sensitive staph aureus Proteus and group B strep. I gave him doxycycline which is not a reliable cover of strep or Proteus. I am going to give him a course of Augmentin today. He has not been systemically unwell. Electronic Signature(s) Signed: 09/17/2019 5:31:36 PM By: Baltazar Najjar MD Entered By: Baltazar Najjar on 09/17/2019 09:23:40 -------------------------------------------------------------------------------- Physical Exam Details Patient Name: Date of Service: Allen Gregory, Allen Gregory 09/17/2019 8:00 AM Medical Record [id number] Patient Account Number: [id number] Date of Birth/Sex: Treating RN: [birth year] (59 y.o. [name] Primary Care Provider: PATIENT, NO Other Clinician: Referring Provider: Treating Provider/Extender:Keisa Blow, [name] in Treatment: 10 Constitutional Sitting or standing Blood Pressure is within target range for patient.. Pulse regular and within target range for patient.Marland Kitchen Respirations regular, non-labored and within target range.. Temperature is normal and within the target range for the patient.Marland Kitchen Appears in no distress. Cardiovascular Pedal  pulses are palpable. Notes Wound exam He has 2 wounds to the original wound on the plantar right great toe and the one at the tip. Mycotic nail. There is still swelling and erythema in the base of his toe although in some sense the extent of the cellulitis is improved. Electronic Signature(s) Signed: 09/17/2019 5:31:36 PM By: Baltazar Najjar MD Entered By: Baltazar Najjar on 09/17/2019 09:24:35 -------------------------------------------------------------------------------- Physician Orders Details Patient Name: Date of Service: Allen Gregory, Allen Gregory 09/17/2019 8:00 AM Medical Record [id number] Patient Account Number: [id number] Date of Birth/Sex: Treating RN: April 24, 1961 (59 y.o. 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 Dressing Change Frequency Wound #1 Right,Plantar Toe Great Change Dressing every other day. Wound #3 Right,Distal Toe Great Change Dressing every other day. Wound Cleansing May shower and wash wound with soap and water. Primary Wound Dressing Wound #1 Right,Plantar Toe Great Calcium Alginate with Silver Wound #3 Right,Distal Toe Great Calcium Alginate with Silver Secondary Dressing Wound #1 Right,Plantar Toe Great Foam - foam donut Kerlix/Rolled Gauze - secure with tape Dry Gauze Wound #3 Right,Distal Toe Great Foam - foam donut Kerlix/Rolled Gauze - secure with tape Dry Gauze Off-Loading Wedge shoe to: - right foot Radiology X-ray, foot right complete view - diabetic foot ulcer right great toe CPT 73630 - (ICD10 L97.512 - Non- pressure chronic ulcer of other part of right foot with fat layer exposed) Patient Medications Allergies: lisinopril Notifications Medication Indication Start End Augmentin wound infection 09/17/2019 rigtht great toe DOSE oral 875 mg-125 mg tablet - 1 tablet oral bid for 10 days Electronic Signature(s) Signed: 09/17/2019 9:26:21 AM By: Baltazar Najjar MD Entered By: Baltazar Najjar on 09/17/2019 09:26:20 -------------------------------------------------------------------------------- Prescription 09/17/2019 Patient Name: Kizzie Furnish Provider: Baltazar Najjar MD Date of Birth: 10/23/1960 NPI#: [id number] Sex: M DEA#: [id number] Phone #: [phone] License #:  [id number] Patient Address: Eligha Bridegroom [facility] [address] [place], [place] [address] [address] [place], [place] [address] [phone] Allergies lisinopril Reaction: cough Provider's Orders X-ray, foot right complete view - ICD10: L97.512 - diabetic foot ulcer right great toe CPT 73630 Signature(s): Date(s): Electronic Signature(s) Signed: 09/17/2019 5:31:36 PM By: Baltazar Najjar MD Entered By: Baltazar Najjar on 09/17/2019 32:99:24 --------------------------------------------------------------------------------  Problem List Details Patient Name: Date of Service: Allen Gregory, Allen Gregory 09/17/2019 8:00 AM Medical Record [id number] Patient Account Number: [id number] Date of Birth/Sex: Treating RN: December 05, 1960 (59 y.o. [name] Primary Care Provider: PATIENT, NO Other Clinician: Referring Provider: Treating Provider/Extender:Hanny Elsberry, [name] in Treatment: 10 Active Problems ICD-10 Evaluated Encounter Code Description Active Date Today Diagnosis E11.621 Type 2 diabetes mellitus with foot ulcer 07/09/2019 No Yes L97.512 Non-pressure chronic ulcer of other part of right foot 07/09/2019 No Yes with fat layer exposed E11.42 Type 2 diabetes mellitus with diabetic polyneuropathy 07/09/2019 No Yes M21.371 Foot drop, right foot 07/09/2019 No Yes L03.031 Cellulitis of right toe 09/10/2019 No Yes Inactive Problems ICD-10 Code Description Active Date Inactive Date E11.51 Type 2 diabetes mellitus with diabetic peripheral angiopathy 07/31/2019 07/31/2019 without gangrene Resolved Problems Electronic Signature(s) Signed: 09/17/2019 5:31:36 PM By: Baltazar Najjar MD Entered  By: Baltazar Najjar on 09/17/2019 09:22:14 -------------------------------------------------------------------------------- Progress Note Details Patient Name: Date of Service: Allen Gregory, Allen Gregory 09/17/2019 8:00 AM Medical Record [id number] Patient Account Number:  [id number] Date of Birth/Sex: Treating RN: 1961/05/01 (59 y.o. [name] Primary Care Provider: PATIENT, NO Other Clinician: Referring Provider: Treating Provider/Extender:Markeis Allman, [name] in Treatment: 10 Subjective History of Present Illness (HPI) ADMISSION 07/09/2019 This is a pleasant 59 year old man who referred himself here for second opinion sign a new area on the right plantar first toe and the dorsal part of his right fifth toe. He says he had these in August when he had removed callus from the first toe and then played pool volleyball for about 4 hours. He thinks the bottom of the pool simply caused an abrasion of the toe. The story sounds accurate. He has been going to podiatry for the last several months. Me a picture on his phone from August and the wound is gotten considerably smaller. He states that it started doing well recently when they started collagen. He has been using a surgical shoe to offload. 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 He had a brace for a long time but now he simply lifts his foot higher to clear but states that most Allen Gregory would not even notice that he had any disability. He is a type 2 diabetes Badik but he has diet-controlled he has lost weight by watching calories. 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 07/16/2019; right fifth toe is closed. Right first toe is smaller. He is using a surgical shoe but he tells me is fairly religious about using his scooter at home. Hopefully this will give him enough offloading to close this. 2/1; right fifth toe dorsally remains closed. Right first toe still not a lot of improvement. Although  superficially it looks smaller there is undermining laterally from 6-6 of at least 3 to 4 mm. Also worrisome is that the granulation does not look that healthy. We have been using silver collagen He had his foot x-rayed by his podiatrist but I do not have access to this. I am going to x-ray the right great toe again. Otherwise I am thinking he is going to require a total contact cast probably next week and I discussed this with him today. 2/9; his x-ray of the right foot did not show plain x-ray evidence of osteomyelitis. We have been using silver alginate the wound is not improved looks static. He is going to get a total contact cast today He comes in today telling me that before he left podiatry before he came here he had noninvasive arterial studies that were done by a mobile service and the podiatrist office. They are now trying to arrange I think an angiogram however this would be in [facility]. He asked my opinion on this and I told him that angiograms are done by several different doctor groups locally and if he is from [place] I cannot see a reason to go out of [facility] for any procedure he might need. We will see if we can get the actual angiogram report and I will refer him as needed to either vein and vascular or interventional cardiology 2/12;  back for his first obligatory total contact cast change. I did get his arterial studies from in [facility]. Biphasic waveforms ABI in the right of 1.16 on the left at 1.14 I am not really sure what was so concerning about this [place] I will simply repeat these studies along with TBI's locally. There should not be any need for him to go out of town to have this evaluation. I am not sure that there is a clinical need 2/16; once again the total contact cast was too loose he has multiple skin excoriations where things were rubbing. He claims to not be on this all that  much and is working from home but between the size of his foot the wasting in the distal part of his lower leg and large calfs we just cannot mold the cast in to fit his leg properly.-Changed him to a forefoot off loader but with his foot drop that may not be possible. 2/23; we put him out in a surgical shoe last week as we did not have a forefoot off loader. We have this today. We are using silver alginate. He did not tolerate a total contact cast 3/1; he is in a forefoot offloading boot. Using silver collagen as of last week. No major change 3/8; he is using a forefoot offloading boot. Silver collagen over the last 2 weeks. Perhaps some reduction in depth. Moreover surface looks healthy 3/15; he is a forefoot offloading boot. Silver collagen over the last 3 weeks. There has been reduction in depth. Much less circumferential callus and thick subcutaneous tissue. 3/22; his original wound on the plantar right great toe is somewhat improved. However he arrives in clinic today with a completely denuded ulcer on the tip of the toe distal to the original wound. More concerning is there is loss of surface epithelium over a wide area around this even dorsally on the toe. He states that he last wrapped the area on Saturday. [facility] otherwise he had not done anything different. Noticeable for the fact that his forefoot offloading shoe was not in good condition fact that split open this could have had something to do with this but I have no doubt that there is also some degree of infection/cellulitis 3/29; patient came to the clinic last week with a new wound on the tip of the right great toe. This looked infected there was epithelial loss extending to the base of the first toe dorsally on the right. I gave him empiric doxycycline. Culture I did showed methicillin sensitive staph aureus Proteus and group B strep. I gave him doxycycline which is not a reliable cover of strep  or Proteus. I am going to give him a course of Augmentin today. He has not been systemically unwell. Objective Constitutional Sitting or standing Blood Pressure is within target range for patient.. Pulse regular and within target range for patient.Marland Kitchen. Respirations regular, non-labored and within target range.. Temperature is normal and within the target range for the patient.Marland Kitchen. Appears in no distress. Vitals Time Taken: 8:11 AM, Height: 78 in, Weight: 285 lbs, BMI: 32.9, Temperature: 98.0 F, Pulse: 72 bpm, Respiratory Rate: 18 breaths/min, Blood Pressure: 142/79 mmHg. Cardiovascular Pedal pulses are palpable. General Notes: Wound exam ooHe has 2 wounds to the original wound on the plantar right great toe and the one at the tip. Mycotic nail. There is still swelling and erythema  in the base of his toe although in some sense the extent of the cellulitis is improved. Integumentary (Hair, Skin) Wound #1 status is Open. Original cause of wound was Gradually Appeared. The wound is located on the [employer]. The wound measures 0.5cm length x 0.4cm width x 0.3cm depth; 0.157cm^2 area and 0.047cm^3 volume. There is Fat Layer (Subcutaneous Tissue) Exposed exposed. There is no tunneling or undermining noted. There is a small amount of serosanguineous drainage noted. The wound margin is thickened. There is medium (34-66%) red granulation within the wound bed. There is a medium (34-66%) amount of necrotic tissue within the wound bed including Adherent Slough. Wound #3 status is Open. Original cause of wound was Blister. The wound is located on the Right,Distal [employer] The wound measures 1cm length x 1cm width x 0.1cm depth; 0.785cm^2 area and 0.079cm^3 volume. There is Fat Layer (Subcutaneous Tissue) Exposed exposed. There is no tunneling or undermining noted. There is a medium amount of serosanguineous drainage noted. The wound margin is distinct with the outline attached to the  wound base. There is medium (34-66%) pink, pale granulation within the wound bed. There is a medium (34-66%) amount of necrotic tissue within the wound bed. 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 Dressing Change Frequency: Wound #1 Right,Plantar Toe Great: Change Dressing every other day. Wound #3 Right,Distal Toe Great: Change Dressing every other day. Wound Cleansing: May shower and wash wound with soap and water. Primary Wound Dressing: Wound #1 Right,Plantar Toe Great: Calcium Alginate with Silver Wound #3 Right,Distal Toe Great: Calcium Alginate with Silver Secondary Dressing: Wound #1 Right,Plantar Toe Great: Foam - foam donut Kerlix/Rolled Gauze - secure with tape Dry Gauze Wound #3 Right,Distal Toe Great: Foam - foam donut Kerlix/Rolled Gauze - secure with tape Dry Gauze Off-Loading: Wedge shoe to: - right foot Radiology ordered were: X-ray, foot right complete view - diabetic foot ulcer right great toe CPT 73630 The following medication(s) was prescribed: Augmentin oral 875 mg-125 mg tablet 1 tablet oral bid for 10 days for wound infection rigtht great toe starting 09/17/2019 1. Augmentin 875 1 p.o. twice daily x10 days 2. X-ray of the toe, cannot rule out an MRI. The patient is a diabetic although at one point he was diet controlled 3. Take the doxycycline can stop. Culture we did last week showed abundant group B strep, few methicillin sensitive staph aureus and a few Proteus 4. Emphasized keeping the pressure off this area. Electronic Signature(s) Signed: 09/17/2019 5:31:36 PM By: Linton Ham MD Entered By: Linton Ham on 09/17/2019  09:27:30 -------------------------------------------------------------------------------- SuperBill Details Patient Name: Date of Service: Allen Gregory, Allen Gregory 09/17/2019 Medical Record [id number] Patient Account Number: [id number] Date of Birth/Sex: Treating RN: 1960-06-28 (59 y.o. 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 M21.371 Foot drop, right foot L03.031 Cellulitis of right toe Facility Procedures CPT4 Code: [id number] Description: 99213 - WOUND CARE VISIT-LEV 3 EST PT Modifier: Quantity: 1 Physician Procedures CPT4 Code Description: [id number] - WC PHYS LEVEL  3 - EST PT ICD-10 Diagnosis Description L03.031 Cellulitis of right toe E11.621 Type 2 diabetes mellitus with foot ulcer L97.512 Non-pressure chronic ulcer of other part of right foot E11.42 Type 2  diabetes mellitus with diabetic polyneuropathy Modifier: with fat layer e Quantity: 1 xposed Electronic Signature(s) Signed: 09/17/2019 5:31:36 PM By: Baltazar Najjar MD Entered By: Baltazar Najjar on 09/17/2019 09:27:54

## 2019-09-19 ENCOUNTER — Ambulatory Visit (HOSPITAL_COMMUNITY)
Admission: RE | Admit: 2019-09-19 | Discharge: 2019-09-19 | Disposition: A | Discharge status: Home / Self Care | Payer: 59 | Source: Ambulatory Visit | Attending: Internal Medicine | Admitting: Internal Medicine

## 2019-09-19 ENCOUNTER — Other Ambulatory Visit (HOSPITAL_COMMUNITY): Payer: Self-pay | Admitting: Internal Medicine

## 2019-09-19 ENCOUNTER — Other Ambulatory Visit: Payer: Self-pay

## 2019-09-19 DIAGNOSIS — M869 Osteomyelitis, unspecified: Secondary | ICD-10-CM | POA: Insufficient documentation

## 2019-09-19 DIAGNOSIS — E11621 Type 2 diabetes mellitus with foot ulcer: Secondary | ICD-10-CM | POA: Diagnosis present

## 2019-09-19 DIAGNOSIS — L97509 Non-pressure chronic ulcer of other part of unspecified foot with unspecified severity: Secondary | ICD-10-CM | POA: Insufficient documentation

## 2019-09-19 DIAGNOSIS — E1169 Type 2 diabetes mellitus with other specified complication: Secondary | ICD-10-CM | POA: Diagnosis present

## 2019-09-24 ENCOUNTER — Other Ambulatory Visit: Payer: Self-pay

## 2019-09-24 ENCOUNTER — Encounter (HOSPITAL_BASED_OUTPATIENT_CLINIC_OR_DEPARTMENT_OTHER): Payer: 59 | Attending: Internal Medicine | Admitting: Internal Medicine

## 2019-09-24 DIAGNOSIS — E1151 Type 2 diabetes mellitus with diabetic peripheral angiopathy without gangrene: Secondary | ICD-10-CM | POA: Insufficient documentation

## 2019-09-24 DIAGNOSIS — L97512 Non-pressure chronic ulcer of other part of right foot with fat layer exposed: Secondary | ICD-10-CM | POA: Diagnosis not present

## 2019-09-24 DIAGNOSIS — Z86711 Personal history of pulmonary embolism: Secondary | ICD-10-CM | POA: Insufficient documentation

## 2019-09-24 DIAGNOSIS — L03031 Cellulitis of right toe: Secondary | ICD-10-CM | POA: Insufficient documentation

## 2019-09-24 DIAGNOSIS — M21371 Foot drop, right foot: Secondary | ICD-10-CM | POA: Diagnosis not present

## 2019-09-24 DIAGNOSIS — E1142 Type 2 diabetes mellitus with diabetic polyneuropathy: Secondary | ICD-10-CM | POA: Diagnosis not present

## 2019-09-24 DIAGNOSIS — E11621 Type 2 diabetes mellitus with foot ulcer: Secondary | ICD-10-CM | POA: Diagnosis present

## 2019-09-24 DIAGNOSIS — I10 Essential (primary) hypertension: Secondary | ICD-10-CM | POA: Diagnosis not present

## 2019-09-24 DIAGNOSIS — Z7901 Long term (current) use of anticoagulants: Secondary | ICD-10-CM | POA: Diagnosis not present

## 2019-09-24 NOTE — Progress Notes (Signed)
SAVANNAH, ERBE ([id number]) Visit Report for 09/24/2019 HPI Details Patient Name: Date of Service: Allen Gregory, Allen Gregory 09/24/2019 8:15 AM Medical Record [id number] Patient Account Number: [id number] Date of Birth/Sex: Treating RN: 03-Jun-1961 (59 y.o. [name] Primary Care Provider: PATIENT, NO Other Clinician: Referring Provider: Treating Provider/Extender:Cicley Ganesh, [name] in Treatment: 11 History of Present Illness HPI Description: ADMISSION 07/09/2019 This is a pleasant 59 year old man who referred himself here for second opinion sign a new area on the right plantar first toe and the dorsal part of his right fifth toe. He says he had these in August when he had removed callus from the first toe and then played pool volleyball for about 4 hours. He thinks the bottom of the pool simply caused an abrasion of the toe. The story sounds accurate. He has been going to podiatry for the last several months. Me a picture on his phone from August and the wound is gotten considerably smaller. He states that it started doing well recently when they started collagen. He has been using a surgical shoe to offload. 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 He had a brace for a long time but now he simply lifts his foot higher to clear but states that most people would not even notice that he had any disability. He is a type 2 diabetes Badik but he has diet-controlled he has lost weight by watching calories. 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 07/16/2019; right fifth toe is closed. Right first toe is smaller. He is using a surgical shoe but he tells me is fairly religious about using his  scooter at home. Hopefully this will give him enough offloading to close this. 2/1; right fifth toe dorsally remains closed. Right first toe still not a lot of improvement. Although superficially it looks smaller there is undermining laterally from 6-6 of at least 3 to 4 mm. Also worrisome is that the granulation does not look that healthy. We have been using silver collagen He had his foot x-rayed by his podiatrist but I do not have access to this. I am going to x-ray the right great toe again. Otherwise I am thinking he is going to require a total contact cast probably next week and I discussed this with him today. 2/9; his x-ray of the right foot did not show plain x-ray evidence of osteomyelitis. We have been using silver alginate the wound is not improved looks static. He is going to get a total contact cast today He comes in today telling me that before he left podiatry before he came here he had noninvasive arterial studies that were done by a mobile service and the podiatrist office. They are now trying to arrange I think an angiogram however this would be in [facility]. He asked my opinion on this and I told him that angiograms are done by several different doctor groups locally and if he is from [place] I cannot see a reason to go out of [facility] for any procedure he might need. We will see if we can get the actual angiogram report and I will refer him as needed to either vein and vascular or interventional cardiology 2/12; back for  his first obligatory total contact cast change. I did get his arterial studies from [facility]. Biphasic waveforms ABI in the right of 1.16 on the left at 1.14 I am not really sure what was so concerning about this [place] I will simply repeat these studies along with TBI's locally. There should not be any need for him to go out of town to have this evaluation. I am not sure  that there is a clinical need 2/16; once again the total contact cast was too loose he has multiple skin excoriations where things were rubbing. He claims to not be on this all that much and is working from home but between the size of his foot the wasting in the distal part of his lower leg and large calfs we just cannot mold the cast in to fit his leg properly.-Changed him to a forefoot off loader but with his foot drop that may not be possible. 2/23; we put him out in a surgical shoe last week as we did not have a forefoot off loader. We have this today. We are using silver alginate. He did not tolerate a total contact cast 3/1; he is in a forefoot offloading boot. Using silver collagen as of last week. No major change 3/8; he is using a forefoot offloading boot. Silver collagen over the last 2 weeks. Perhaps some reduction in depth. Moreover surface looks healthy 3/15; he is a forefoot offloading boot. Silver collagen over the last 3 weeks. There has been reduction in depth. Much less circumferential callus and thick subcutaneous tissue. 3/22; his original wound on the plantar right great toe is somewhat improved. However he arrives in clinic today with a completely denuded ulcer on the tip of the toe distal to the original wound. More concerning is there is loss of surface epithelium over a wide area around this even dorsally on the toe. He states that he last wrapped the area on Saturday. He was up at [facility] walking this weekend otherwise he had not done anything different. Noticeable for the fact that his forefoot offloading shoe was not in good condition fact that split open this could have had something to do with this but I have no doubt that there is also some degree of infection/cellulitis 3/29; patient came to the clinic last week with a new wound on the tip of the right great toe. This looked infected there was epithelial loss extending to the base of the first toe dorsally on the  right. I gave him empiric doxycycline. Culture I did showed methicillin sensitive staph aureus Proteus and group B strep. I gave him doxycycline which is not a reliable cover of strep or Proteus. I am going to give him a course of Augmentin today. He has not been systemically unwell. 4/5; original wound on the plantar right great toe. 2 weeks ago had a new wound on the tip of the right great toe with extensive cellulitis. Culture I did at that time ultimately showed methicillin sensitive staph aureus Proteus and group B strep. He received some doxycycline I changed him to Augmentin last week he has 3 more days. He is complaining of some pruritus but no rash. X-ray; showed no bony abnormalities in the right great toe. Electronic Signature(s) Signed: 09/24/2019 5:24:04 PM By: Baltazar Najjar MD Entered By: Baltazar Najjar on 09/24/2019 09:15:19 -------------------------------------------------------------------------------- Physical Exam Details Patient Name: Date of Service: Allen Gregory, Allen Gregory 09/24/2019 8:15  AM Medical Record [id number] Patient Account Number: [id number] Date of Birth/Sex: Treating RN: Apr 09, 1961 (59 y.o. [name] Primary Care Provider: PATIENT, NO Other Clinician: Referring Provider: Treating Provider/Extender:Gavriella Hearst, [name] in Treatment: 11 Constitutional Patient is hypertensive.. Pulse regular and within target range for patient.Marland Kitchen Respirations regular, non-labored and within target range.. Temperature is normal and within the target range for the patient.Marland Kitchen Appears in no distress. Cardiovascular Pedal pulses are palpable. 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 Notes Wound exam The area on the tip of his toe was closed Still open in the original wound area although measuring smaller. Base of the wound looks healthy. The swelling and erythema in the toe itself dorsally into the dorsal part of his foot has resolved. It  is still possible to see where this was with loss of surface epithelialization. Electronic Signature(s) Signed: 09/24/2019 5:24:04 PM By: Baltazar Najjar MD Entered By: Baltazar Najjar on 09/24/2019 09:16:40 -------------------------------------------------------------------------------- Physician Orders Details Patient Name: Date of Service: Allen Gregory, Allen Gregory 09/24/2019 8:15 AM Medical Record [id number] Patient Account Number: [id number] Date of Birth/Sex: Treating RN: Jun 15, 1961 (59 y.o. 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 Dressing Change Frequency Wound #1 Right,Plantar Toe Great Change Dressing every other day. Wound Cleansing May shower and wash wound with soap and water. Primary Wound Dressing Wound #1 Right,Plantar Toe Great Calcium Alginate with Silver Secondary Dressing Wound #1 Right,Plantar Toe Great Foam - foam donut Kerlix/Rolled Gauze - secure with tape Dry Gauze Off-Loading Wedge shoe to: - right foot Electronic Signature(s) Signed: 09/24/2019 5:09:30 PM By: Zandra Abts RN, BSN Signed: 09/24/2019 5:24:04 PM By: Baltazar Najjar MD Entered By: Zandra Abts on 09/24/2019 08:58:53 -------------------------------------------------------------------------------- Problem List Details Patient Name: Date of Service: Allen Gregory, Allen Gregory 09/24/2019 8:15 AM Medical Record [id number] Patient Account Number: [id number] Date of Birth/Sex: Treating RN: Oct 26, 1960 (59 y.o.  [name] Primary Care Provider: PATIENT, NO Other Clinician: Referring Provider: Treating Provider/Extender:Leydi Winstead, [name] in Treatment: 11 Active Problems ICD-10 Evaluated Encounter Code Description Active Date Today Diagnosis E11.621 Type 2 diabetes mellitus with foot ulcer 07/09/2019 No Yes L97.512 Non-pressure chronic ulcer of other part of right foot 07/09/2019 No Yes with fat layer exposed E11.42 Type 2 diabetes mellitus with diabetic polyneuropathy 07/09/2019 No Yes M21.371 Foot drop, right foot 07/09/2019 No Yes L03.031 Cellulitis of right toe 09/10/2019 No Yes Inactive Problems ICD-10 Code Description Active Date Inactive Date E11.51 Type 2 diabetes mellitus with diabetic peripheral angiopathy 07/31/2019 07/31/2019 without gangrene Resolved Problems Electronic Signature(s) Signed: 09/24/2019 5:24:04 PM By: Baltazar Najjar MD Entered By: Baltazar Najjar on 09/24/2019 09:13:21 -------------------------------------------------------------------------------- Progress Note Details Patient Name: Date of Service: Allen Gregory, Allen Gregory 09/24/2019 8:15 AM Medical Record [id number] Patient Account Number: [id number] Date of Birth/Sex: Treating RN: 06-11-1961 (59 y.o. [name] Primary Care Provider: PATIENT, NO Other Clinician: Referring Provider: Treating Provider/Extender:Blakley Michna, [name] in Treatment: 11 Subjective History of Present Illness (HPI) ADMISSION 07/09/2019 This is a pleasant 59 year old man who referred himself here for second opinion sign a new area on the right plantar first toe and the dorsal part of  his right fifth toe. He says he had these in August when he had removed callus from the first toe and then played pool volleyball for about 4 hours. He thinks the bottom of the pool simply caused an abrasion of the toe. The story sounds accurate. He has been going to podiatry for the last several months. Me a picture on his phone from August and  the wound is gotten considerably smaller. He states that it started doing well recently when they started collagen. He has been using a surgical shoe to offload. 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 He had a brace for a long time but now he simply lifts his foot higher to clear but states that most people would not even notice that he had any disability. He is a type 2 diabetes Badik but he has diet-controlled he has lost weight by watching calories. 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 07/16/2019; right fifth toe is closed. Right first toe is smaller. He is using a surgical shoe but he tells me is fairly religious about using his scooter at home. Hopefully this will give him enough offloading to close this. 2/1; right fifth toe dorsally remains closed. Right first toe still not a lot of improvement. Although superficially it looks smaller there is undermining laterally from 6-6 of at least 3 to 4 mm. Also worrisome is that the granulation does not look that healthy. We have been using silver collagen He had his foot x-rayed by his podiatrist but I do not have access to this. I am going to x-ray the right great toe again. Otherwise I am thinking he is going to require a total contact cast probably next week and I discussed this with him today. 2/9; his x-ray of the right foot did not show plain x-ray evidence of osteomyelitis. We have been using silver alginate the wound is not improved looks static. He is going to get a total contact cast today He comes in today telling me that before he left podiatry before he came here he had noninvasive arterial studies that were done by a mobile service  and the podiatrist office. They are now trying to arrange I think an angiogram however this would be in [facility]. He asked my opinion on this and I told him that angiograms are done by several different doctor groups locally and if he is from [place] I cannot see a reason to go out of [facility] for any procedure he might need. We will see if we can get the actual angiogram report and I will refer him as needed to either vein and vascular or interventional cardiology 2/12; back for his first obligatory total contact cast change. I did get his arterial studies from [facility]. Biphasic waveforms ABI in the right of 1.16 on the left at 1.14 I am not really sure what was so concerning about this [place] I will simply repeat these studies along with TBI's locally. There should not be any need for him to go out of town to have this evaluation. I am not sure that there  is a clinical need 2/16; once again the total contact cast was too loose he has multiple skin excoriations where things were rubbing. He claims to not be on this all that much and is working from home but between the size of his foot the wasting in the distal part of his lower leg and large calfs we just cannot mold the cast in to fit his leg properly.-Changed him to a forefoot off loader but with his foot drop that may not be possible. 2/23; we put him out in a surgical shoe last week as we did not have a forefoot off loader. We have this today. We are using silver alginate. He did not tolerate a total contact cast 3/1; he is in a forefoot offloading boot. Using silver collagen as of last week. No major change 3/8; he is using a forefoot offloading boot. Silver collagen over the last 2 weeks. Perhaps some reduction in depth. Moreover surface looks healthy 3/15; he is a forefoot offloading boot. Silver collagen over the last 3 weeks. There has been  reduction in depth. Much less circumferential callus and thick subcutaneous tissue. 3/22; his original wound on the plantar right great toe is somewhat improved. However he arrives in clinic today with a completely denuded ulcer on the tip of the toe distal to the original wound. More concerning is there is loss of surface epithelium over a wide area around this even dorsally on the toe. He states that he last wrapped the area on Saturday. He was up at [facility] walking this weekend otherwise he had not done anything different. Noticeable for the fact that his forefoot offloading shoe was not in good condition fact that split open this could have had something to do with this but I have no doubt that there is also some degree of infection/cellulitis 3/29; patient came to the clinic last week with a new wound on the tip of the right great toe. This looked infected there was epithelial loss extending to the base of the first toe dorsally on the right. I gave him empiric doxycycline. Culture I did showed methicillin sensitive staph aureus Proteus and group B strep. I gave him doxycycline which is not a reliable cover of strep or Proteus. I am going to give him a course of Augmentin today. He has not been systemically unwell. 4/5; original wound on the plantar right great toe. 2 weeks ago had a new wound on the tip of the right great toe with extensive cellulitis. Culture I did at that time ultimately showed methicillin sensitive staph aureus Proteus and group B strep. He received some doxycycline I changed him to Augmentin last week he has 3 more days. He is complaining of some pruritus but no rash. X-ray; showed no bony abnormalities in the right great toe. Objective Constitutional Patient is hypertensive.. Pulse regular and within target range for patient.Marland Kitchen Respirations regular, non-labored and within target range.. Temperature is normal and within the target range for the patient.Marland Kitchen Appears in no  distress. Vitals Time Taken: 8:34 AM, Height: 78 in, Weight: 285 lbs, BMI: 32.9, Temperature: 98.3 F, Pulse: 81 bpm, Respiratory Rate: 18 breaths/min, Blood Pressure: 159/89 mmHg. Cardiovascular Pedal pulses are palpable. General Notes: Wound exam ooThe area on the tip of his toe was closed ooStill open in the original wound area although measuring smaller. Base of the wound looks healthy. ooThe swelling and erythema in the toe itself dorsally into the dorsal part of his foot has resolved. It is still  possible to see where this was with loss of surface epithelialization. 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 Wound #1 status is Open. Original cause of wound was Gradually Appeared. The wound is located on the [employer]ight,Plantar Toe Great. The wound measures 0.6cm length x 0.4cm width x 0.2cm depth; 0.188cm^2 area and 0.038cm^3 volume. There is Fat Layer (Subcutaneous Tissue) Exposed exposed. There is no tunneling or undermining noted. There is a small amount of serosanguineous drainage noted. The wound margin is thickened. There is medium (34-66%) red granulation within the wound bed. There is a medium (34-66%) amount of necrotic tissue within the wound bed including Adherent Slough. Wound #3 status is Healed - Epithelialized. Original cause of wound was Blister. The wound is located on the Right,Distal [place] Great. The wound measures 0cm length x 0cm width x 0cm depth; 0cm^2 area and 0cm^3 volume. There is no tunneling or undermining noted. There is a none present amount of drainage noted. The wound margin is distinct with the outline attached to the wound base. There is no granulation within the wound bed. There is no necrotic tissue within the wound bed. Assessment Active Problems ICD-10 Type 2 diabetes mellitus with foot ulcer Non-pressure chronic ulcer of other part of right foot with fat layer exposed Type 2 diabetes mellitus with diabetic  polyneuropathy Foot drop, right foot Cellulitis of right toe Plan Follow-up Appointments: Return Appointment in 1 week. Dressing Change Frequency: Wound #1 Right,Plantar Toe Great: Change Dressing every other day. Wound Cleansing: May shower and wash wound with soap and water. Primary Wound Dressing: Wound #1 Right,Plantar Toe Great: Calcium Alginate with Silver Secondary Dressing: Wound #1 Right,Plantar Toe Great: Foam - foam donut Kerlix/Rolled Gauze - secure with tape Dry Gauze Off-Loading: Wedge shoe to: - right foot 1. I am continuing with silver alginate 2. I am going to monitor the area on the tip of the toe for now. This may be closed over although I am not sure how viable it is. 3. Curlex and rolled gauze over the toe itself 4. I have asked him to complete the last 3 days of the Augmentin which should take care of everything we cultured originally. 5. For now no new tests or investigations. Specifically no MRI unless this deteriorates or becomes infected again. If that happens then he will definitely need 1. Electronic Signature(s) Signed: 09/24/2019 5:24:04 PM By: Baltazar Najjarobson, Brandi Tomlinson MD Entered By: Baltazar Najjarobson, Patrcia Schnepp on 09/24/2019 09:18:05 -------------------------------------------------------------------------------- SuperBill Details Patient Name: Date of Service: Allen Gregory, Allen Gregory 09/24/2019 Medical Record [id number] Patient Account Number: [id number] Date of Birth/Sex: Treating RN: 02/04/1961 (59 y.o. 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 M21.371 Foot drop, right foot L03.031 Cellulitis of right  toe Facility Procedures CPT4 Code: [id number] Description: 99213 - WOUND CARE VISIT-LEV 3 EST PT Modifier: Quantity: 1 Physician Procedures CPT4 Code Description: [id number] 99213 - WC PHYS LEVEL 3 - EST PT ICD-10 Diagnosis Description L97.512 Non-pressure chronic ulcer of other part of right foot E11.621 Type 2 diabetes mellitus with foot ulcer L03.031 Cellulitis of right toe Modifier: with fat layer e Quantity: 1 xposed Electronic Signature(s) Signed: 09/24/2019 5:09:30 PM By: Zandra AbtsLynch, Shatara RN, BSN Signed: 09/24/2019 5:24:04 PM By: Baltazar Najjarobson, Shandee Jergens MD  Entered By: Zandra Abts on 09/24/2019 09:44:28

## 2019-09-26 NOTE — Progress Notes (Signed)
Allen Gregory, Allen Gregory ([id number]) Visit Report for 09/24/2019 Arrival Information Details Patient Name: Date of Service: JERRIK, HOUSHOLDER 09/24/2019 8:15 AM Medical Record [id number] Patient Account Number: [id number] Date of Birth/Sex: Treating RN: Feb 05, 1961 (59 y.o. [name]) Carlene Coria Primary Care Terra Aveni: PATIENT, NO Other Clinician: Referring Marysue Fait: Treating Andra Heslin/Extender:Robson, Esperanza Richters in Treatment: 11 Visit Information History Since Last Visit All ordered tests and consults were completed: No Patient Arrived: Ambulatory Added or deleted any medications: No Arrival Time: 08:33 Any new allergies or adverse reactions: No Accompanied By: self Had a fall or experienced change in No Transfer Assistance: None activities of daily living that may affect Patient Identification Verified: Yes risk of falls: Secondary Verification Process Yes Signs or symptoms of abuse/neglect since last No Completed: visito Patient Requires Transmission- No Hospitalized since last visit: No Based Precautions: Implantable device outside of the clinic excluding No Patient Has Alerts: Yes cellular tissue based products placed in the center Patient Alerts: Patient on Blood since last visit: Thinner Has Dressing in Place as Prescribed: Yes Pain Present Now: No Electronic Signature(s) Signed: 09/25/2019 6:43:44 PM By: Carlene Coria RN Entered By: Carlene Coria on 09/24/2019 08:33:59 -------------------------------------------------------------------------------- Clinic Level of Care Assessment Details Patient Name: Date of Service: CURTISS, MAHMOOD 09/24/2019 8:15 AM Medical Record [id number] Patient Account Number: [id number] Date of Birth/Sex: Treating RN: 1960/10/14 (59 y.o. [name] Primary Care Cavin Longman: PATIENT, NO Other Clinician: Referring Enza Shone: Treating Gari Trovato/Extender:Robson, Esperanza Richters in Treatment: 11 Clinic Level of Care Assessment Items TOOL 4 Quantity  Score X - Use when only an EandM is performed on FOLLOW-UP visit 1 0 ASSESSMENTS - Nursing Assessment / Reassessment X - Reassessment of Co-morbidities (includes updates in patient status) 1 10 X - Reassessment of Adherence to Treatment Plan 1 5 ASSESSMENTS - Wound and Skin Assessment / Reassessment X - Simple Wound Assessment / Reassessment - one wound 1 5 []  - Complex Wound Assessment / Reassessment - multiple wounds 0 []  - Dermatologic / Skin Assessment (not related to wound area) 0 ASSESSMENTS - Focused Assessment []  - Circumferential Edema Measurements - multi extremities 0 []  - Nutritional Assessment / Counseling / Intervention 0 X - Lower Extremity Assessment (monofilament, tuning fork, pulses) 1 5 []  - Peripheral Arterial Disease Assessment (using hand held doppler) 0 ASSESSMENTS - Ostomy and/or Continence Assessment and Care []  - Incontinence Assessment and Management 0 []  - Ostomy Care Assessment and Management (repouching, etc.) 0 PROCESS - Coordination of Care X - Simple Patient / Family Education for ongoing care 1 15 []  - Complex (extensive) Patient / Family Education for ongoing care 0 X - Staff obtains Programmer, systems, Records, Test Results / Process Orders 1 10 []  - Staff telephones HHA, Nursing Homes / Clarify orders / etc 0 []  - Routine Transfer to another Facility (non-emergent condition) 0 []  - Routine Hospital Admission (non-emergent condition) 0 []  - New Admissions / Biomedical engineer / Ordering NPWT, Apligraf, etc. 0 []  - Emergency Hospital Admission (emergent condition) 0 X - Simple Discharge Coordination 1 10 []  - Complex (extensive) Discharge Coordination 0 PROCESS - Special Needs []  - Pediatric / Minor Patient Management 0 []  - Isolation Patient Management 0 []  - Hearing / Language / Visual special needs 0 []  - Assessment of Community assistance (transportation, D/C planning, etc.) 0 []  - Additional assistance / Altered mentation 0 []  - Support Surface(s)  Assessment (bed, cushion, seat, etc.) 0 INTERVENTIONS - Wound Cleansing / Measurement X - Simple Wound Cleansing - one wound 1 5 []  - Complex Wound Cleansing -  multiple wounds 0 X - Wound Imaging (photographs - any number of wounds) 1 5 []  - Wound Tracing (instead of photographs) 0 X - Simple Wound Measurement - one wound 1 5 []  - Complex Wound Measurement - multiple wounds 0 INTERVENTIONS - Wound Dressings X - Small Wound Dressing one or multiple wounds 1 10 []  - Medium Wound Dressing one or multiple wounds 0 []  - Large Wound Dressing one or multiple wounds 0 X - Application of Medications - topical 1 5 []  - Application of Medications - injection 0 INTERVENTIONS - Miscellaneous []  - External ear exam 0 []  - Specimen Collection (cultures, biopsies, blood, body fluids, etc.) 0 []  - Specimen(s) / Culture(s) sent or taken to Lab for analysis 0 []  - Patient Transfer (multiple staff / / Similar devices) 0 []  - Simple Staple / Suture removal (25 or less) 0 []  - Complex Staple / Suture removal (26 or more) 0 []  - Hypo / Hyperglycemic Management (close monitor of Blood Glucose) 0 []  - Ankle / Brachial Index (ABI) - do not check if billed separately 0 X - Vital Signs 1 5 Has the patient been seen at the hospital within the last three years: Yes Total Score: 95 Level Of Care: New/Established - Level 3 Electronic Signature(s) Signed: 09/24/2019 5:09:30 PM By: RN, BSN Entered By: on 09/24/2019 09:44:18 -------------------------------------------------------------------------------- Encounter Discharge Information Details Patient Name: Date of Service: ORLO, BRICKLE 09/24/2019 8:15 AM Medical Record Patient Account Number: Date of Birth/Sex: Treating RN: March 05, 1961 (59 y.o. Primary Care Markan Cazarez: PATIENT, NO Other Clinician: Referring Dagmawi Venable: Treating Genean Adamski/Extender:Robson, in Treatment:  11 Encounter Discharge Information Items Discharge Condition: Stable Ambulatory Status: Ambulatory Discharge Destination: Home Transportation: Private Auto Accompanied By: self Schedule Follow-up Appointment: Yes Clinical Summary of Care: Electronic Signature(s) Signed: 09/24/2019 5:03:02 PM By: 11/24/2019 Entered By: Zandra Abts on 09/24/2019 09:08:15 -------------------------------------------------------------------------------- Lower Extremity Assessment Details Patient Name: Date of Service: DANNER, PAULDING 09/24/2019 8:15 AM Medical Record 11/24/2019 Patient Account Number: [id number] Date of Birth/Sex: Treating RN: [birth year] (58 y.o. 59) [name] Primary Care Sherra Kimmons: PATIENT, NO Other Clinician: Referring Jarriel Papillion: Treating Mali Eppard/Extender:Robson, [name] in Treatment: 11 Edema Assessment Assessed: [Left: No] [Right: No] Edema: [Left: N] [Right: o] Calf Left: Right: Point of Measurement: 38 cm From Medial Instep cm 45 cm Ankle Left: Right: Point of Measurement: 10 cm From Medial Instep cm 24 cm Electronic Signature(s) Signed: 09/25/2019 6:43:44 PM By: Shawn Stall RN Entered By: Shawn Stall on 09/24/2019 08:40:18 -------------------------------------------------------------------------------- Multi Wound Chart Details Patient Name: Date of Service: ONIEL, MELESKI 09/24/2019 8:15 AM Medical Record [id number] Patient Account Number: [id number] Date of Birth/Sex: Treating RN: [birth year] (59 y.o. 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] [3:Lower Extremity] Comorbid History: [1:Pneumothorax, Deep Vein Pneumothorax, Deep Vein N/A Thrombosis, Type II Diabetes, Neuropathy] [3:Thrombosis, Type II Diabetes, Neuropathy] Date Acquired: [1:01/20/2019] [3:09/10/2019] [N/A:N/A] Weeks of Treatment: [1:11] [3:2] [N/A:N/A] Wound Status: [1:Open] [3:Healed - Epithelialized] [N/A:N/A] Measurements L x W x D 0.6x0.4x0.2 [3:0x0x0] [N/A:N/A] (cm) Area (cm) : [1:0.188] [3:0] [N/A:N/A] Volume (cm) : [  1:0.038] [3:0] [N/A:N/A] % Reduction in Area: [1:60.10%] [3:100.00%] [N/A:N/A] % Reduction in Volume: 83.90% [3:100.00%] [N/A:N/A] Classification: [1:Grade 2] [3:Grade 2] [N/A:N/A] Exudate Amount: [1:Small] [3:None Present] [N/A:N/A] Exudate Type: [1:Serosanguineous] [3:N/A] [N/A:N/A] Exudate Color: [1:red, brown] [3:N/A] [N/A:N/A] Wound Margin: [1:Thickened] [3:Distinct, outline attached N/A] Granulation Amount: [1:Medium (34-66%)] [3:None Present (0%)] [N/A:N/A] Granulation Quality: [1:Red] [3:N/A] [N/A:N/A] Necrotic Amount: [1:Medium (34-66%)] [3:None Present (0%)] [N/A:N/A] Exposed Structures: [1:Fat Layer (Subcutaneous Fascia: No Tissue) Exposed: Yes Fascia: No Tendon: No Muscle: No Joint: No Bone: No] [3:Fat Layer (Subcutaneous Tissue) Exposed: No Tendon: No Muscle: No Joint: No Bone: No] [N/A:N/A] Epithelialization: [1:Small (1-33%)] [3:Large (67-100%)] [N/A:N/A] Treatment Notes Wound #1 (Right, Plantar Toe Great) [1:1. Cleanse With Wound Cleanser 3. Primary Dressing Applied Calcium Alginate Ag 4. Secondary Dressing Dry Gauze Roll Gauze Foam 5. Secured With Medipore tape] Notes foam donut as secondary. Electronic Signature(s) Signed: 09/24/2019 5:09:30 PM By: Zandra Abts RN, BSN Signed: 09/24/2019 5:24:04 PM By: Baltazar Najjar MD Entered By: Baltazar Najjar on 09/24/2019  09:13:32 -------------------------------------------------------------------------------- Multi-Disciplinary Care Plan Details Patient Name: Date of Service: ORLYN, ODONOGHUE 09/24/2019 8:15 AM Medical Record [id number] Patient Account Number: [id number] Date of Birth/Sex: Treating RN: 1960/10/01 (59 y.o. [name] Primary Care Vanette Noguchi: PATIENT, NO Other Clinician: Referring Marilynn Ekstein: Treating Liboria Putnam/Extender:Robson, [name] in Treatment: 11 Active Inactive Wound/Skin Impairment Nursing Diagnoses: Impaired tissue integrity Knowledge deficit related to ulceration/compromised skin integrity Goals: Patient/caregiver will verbalize understanding of skin care regimen Date Initiated: 07/09/2019 Target Resolution Date: 10/05/2019 Goal Status: Active Interventions: Assess patient/caregiver ability to obtain necessary supplies Assess patient/caregiver ability to perform ulcer/skin care regimen upon admission and as needed Assess ulceration(s) every visit Provide education on ulcer and skin care Notes: Electronic Signature(s) Signed: 09/24/2019 5:09:30 PM By: Zandra Abts RN, BSN Entered By: Zandra Abts on 09/24/2019 08:42:42 -------------------------------------------------------------------------------- Pain Assessment Details Patient Name: Date of Service: JORDAN, PARDINI 09/24/2019 8:15 AM Medical Record [id number] Patient Account Number: [id number] Date of Birth/Sex: Treating RN: 1961-02-28 (58 y.o. [name]) Yevonne Pax Primary Care Tyce Delcid: PATIENT, NO Other Clinician: Referring Suheyla Mortellaro: Treating Isaid Salvia/Extender:Robson, [name] in Treatment: 11 Active Problems Location of Pain Severity and Description of Pain Patient Has Paino No Site Locations Pain Management and Medication Current Pain Management: Electronic Signature(s) Signed: 09/25/2019 6:43:44 PM By: Yevonne Pax RN Entered By: Yevonne Pax on 09/24/2019  08:34:38 -------------------------------------------------------------------------------- Patient/Caregiver Education Details Patient Name: Date of Service: JAYDIEN, PANEPINTO 4/5/2021andnbsp8:15 AM Medical Record [id number] Patient Account Number: [id number] Date of Birth/Gender: Treating RN: 24-Jul-1960 (59 y.o. [name] Primary Care Physician: PATIENT, NO Other Clinician: Referring Physician: Treating Physician/Extender:Robson, [name] in Treatment: 11 Education Assessment Education Provided To: Patient Education Topics Provided Wound/Skin Impairment: Methods: Explain/Verbal Responses: State content correctly Electronic Signature(s) Signed: 09/24/2019 5:09:30 PM By: Zandra Abts RN, BSN Entered By: Zandra Abts on 09/24/2019 08:42:56 -------------------------------------------------------------------------------- Wound Assessment Details Patient Name: Date of Service: DOROTHY, LANDGREBE 09/24/2019 8:15 AM Medical Record [id number] Patient Account Number: [id number] Date of Birth/Sex: Treating RN: 11-26-1960 (59 y.o. [name] Primary Care Kambry Takacs: PATIENT, NO Other Clinician: Referring Tameah Mihalko: Treating Everette Dimauro/Extender:Robson, [name] in Treatment: 11 Wound Status Wound Number: 1 Primary Diabetic Wound/Ulcer of the Lower Etiology: Extremity Wound Location: Right, Plantar Toe Great Wound Open Wounding Event: Gradually Appeared Status: Date Acquired: 01/20/2019 Comorbid Pneumothorax, Deep Vein Thrombosis, Type Weeks Of Treatment: 11 History: II Diabetes, Neuropathy Clustered Wound: No Wound Measurements Length: (cm) 0.6 % Reducti Width: (cm) 0.4 % Reducti Depth: (cm) 0.2 Epithelia Area: (cm) 0.188 Tunnelin Volume: (cm) 0.038 Undermin Wound Description  Classification: Grade 2 Wound Margin: Thickened Exudate Amount: Small Exudate Type: Serosanguineous Exudate Color: red, brown Wound Bed Granulation Amount: Medium  (34-66%) Granulation Quality: Red Necrotic Amount: Medium (34-66%) Necrotic Quality: Adherent Slough Foul Odor After Cleansing: No Slough/Fibrino Yes Exposed Structure Fascia Exposed: No Fat Layer (Subcutaneous Tissue) Exposed: Yes Tendon Exposed: No Muscle Exposed: No Joint Exposed: No Bone Exposed: No on in Area: 60.1% on in Volume: 83.9% lization: Small (1-33%) g: No ing: No Treatment Notes Wound #1 (Right, Plantar Toe Great) 1. Cleanse With Wound Cleanser 3. Primary Dressing Applied Calcium Alginate Ag 4. Secondary Dressing Dry Gauze Roll Gauze Foam 5. Secured With Medipore tape Notes foam donut as secondary. Electronic Signature(s) Signed: 09/24/2019 5:09:30 PM By: Zandra Abts RN, BSN Entered By: Zandra Abts on 09/24/2019 08:58:30 -------------------------------------------------------------------------------- Wound Assessment Details Patient Name: Date of Service: DARRIC, PLANTE 09/24/2019 8:15 AM Medical Record [id number] Patient Account Number: [id number] Date of Birth/Sex: Treating RN: 01-29-1961 (58 y.o. [name]) Yevonne Pax Primary Care Faraaz Wolin: PATIENT, NO Other Clinician: Referring Lamount Bankson: Treating Rolande Moe/Extender:Robson, [name] in Treatment: 11 Wound Status Wound Number: 3 Primary Diabetic Wound/Ulcer of the Lower Etiology: Extremity Wound Location: Right, Distal Toe Great Wound Healed - Epithelialized Wounding Event: Blister Status: Date Acquired: 09/10/2019 Comorbid Pneumothorax, Deep Vein Thrombosis, Type Weeks Of Treatment: 2 History: II Diabetes, Neuropathy Clustered Wound: No Wound Measurements Length: (cm) 0 % Reductio Width: (cm) 0 % Reductio Depth: (cm) 0 Epithelial Area: (cm) 0 Tunneling Volume: (cm) 0 Undermini Wound Description Classification: Grade 2 Foul Odo Wound Margin: Distinct, outline attached Slough/F Exudate Amount: None Present Wound Bed Granulation Amount: None Present (0%) Necrotic Amount: None  Present (0%) Fascia E Fat Laye Tendon E Muscle E Joint Ex Bone Exp r After Cleansing: No ibrino No Exposed Structure xposed: No r (Subcutaneous Tissue) Exposed: No xposed: No xposed: No posed: No osed: No n in Area: 100% n in Volume: 100% ization: Large (67-100%) : No ng: No Electronic Signature(s) Signed: 09/24/2019 5:09:30 PM By: Zandra Abts RN, BSN Signed: 09/25/2019 6:43:44 PM By: Yevonne Pax RN Entered By: Zandra Abts on 09/24/2019 08:58:15 -------------------------------------------------------------------------------- Vitals Details Patient Name: Date of Service: YAMATO, KOPF 09/24/2019 8:15 AM Medical Record [id number] Patient Account Number: [id number] Date of Birth/Sex: Treating RN: 03-02-1961 (58 y.o. [name]) Epps, [employer] Primary Care Rashonda Warrior: PATIENT, NO Other Clinician: Referring Ramonica Grigg: Treating Jeorge Reister/Extender:Robson, [name] in Treatment: 11 Vital Signs Time Taken: 08:34 Temperature (F): 98.3 Height (in): 78 Pulse (bpm): 81 Weight (lbs): 285 Respiratory Rate (breaths/min): 18 Body Mass Index (BMI): 32.9 Blood Pressure (mmHg): 159/89 Reference Range: 80 - 120 mg / dl Electronic Signature(s) Signed: 09/25/2019 6:43:44 PM By: Yevonne Pax RN Entered By: Yevonne Pax on 09/24/2019 08:34:29

## 2019-10-01 ENCOUNTER — Other Ambulatory Visit: Payer: Self-pay

## 2019-10-01 ENCOUNTER — Encounter (HOSPITAL_BASED_OUTPATIENT_CLINIC_OR_DEPARTMENT_OTHER): Payer: 59 | Admitting: Internal Medicine

## 2019-10-01 DIAGNOSIS — E11621 Type 2 diabetes mellitus with foot ulcer: Secondary | ICD-10-CM | POA: Diagnosis not present

## 2019-10-02 NOTE — Progress Notes (Signed)
Allen, Gregory ([id number]) Visit Report for 10/01/2019 Arrival Information Details Patient Name: Date of Service: Allen Gregory, Allen Gregory 10/01/2019 8:00 AM Medical Record [id number] Patient Account Number: [id number] Date of Birth/Sex: Treating RN: 1960-12-06 (59 y.o. Allen Gregory, Meta.Reding Primary Care Tranae Laramie: PATIENT, NO Other Clinician: Referring Suleyma Wafer: Treating Miela Desjardin/Extender:Robson, Esperanza Richters in Treatment: 12 Visit Information History Since Last Visit Added or deleted any medications: No Patient Arrived: Ambulatory Any new allergies or adverse reactions: No Arrival Time: 08:00 Had a fall or experienced change in No Accompanied By: self activities of daily living that may affect Transfer Assistance: None risk of falls: Patient Identification Verified: Yes Signs or symptoms of abuse/neglect since No Secondary Verification Process Yes last visito Completed: Hospitalized since last visit: No Patient Requires Transmission- No Implantable device outside of the clinic No Based Precautions: excluding Patient Has Alerts: Yes cellular tissue based products placed in the Patient Alerts: Patient on Blood center Thinner since last visit: Has Dressing in Place as Prescribed: Yes Has Footwear/Offloading in Place as Yes Prescribed: Right: Wedge Shoe Pain Present Now: No Electronic Signature(s) Signed: 10/01/2019 5:40:16 PM By: Deon Pilling Entered By: Deon Pilling on 10/01/2019 08:04:19 -------------------------------------------------------------------------------- Encounter Discharge Information Details Patient Name: Date of Service: Allen, Gregory 10/01/2019 8:00 AM Medical Record [id number] Patient Account Number: [id number] Date of Birth/Sex: Treating RN: 07-22-1960 (59 y.o. [name] Primary Care Roseanna Koplin: PATIENT, NO Other Clinician: Referring Ithan Touhey: Treating Shandon Matson/Extender:Robson, Esperanza Richters in Treatment: 12 Encounter Discharge Information  Items Post Procedure Vitals Discharge Condition: Stable Temperature (F): 98.2 Ambulatory Status: Ambulatory Pulse (bpm): 60 Discharge Destination: Home Respiratory Rate (breaths/min): 18 Transportation: Private Auto Blood Pressure (mmHg): 136/67 Accompanied By: self Schedule Follow-up Appointment: Yes Clinical Summary of Care: Electronic Signature(s) Signed: 10/01/2019 5:40:16 PM By: Deon Pilling Entered By: Deon Pilling on 10/01/2019 08:48:56 -------------------------------------------------------------------------------- Lower Extremity Assessment Details Patient Name: Date of Service: Allen, Gregory 10/01/2019 8:00 AM Medical Record [id number] Patient Account Number: [id number] Date of Birth/Sex: Treating RN: 05/15/1961 (59 y.o. [name] Primary Care Keyton Bhat: PATIENT, NO Other Clinician: Referring Anuel Sitter: Treating Illa Enlow/Extender:Robson, Esperanza Richters in Treatment: 12 Edema Assessment Assessed: [Left: No] [Right: Yes] Edema: [Left: N] [Right: o] Calf Left: Right: Point of Measurement: 38 cm From Medial Instep cm 44.5 cm Ankle Left: Right: Point of Measurement: 10 cm From Medial Instep cm 24 cm Vascular Assessment Pulses: Dorsalis Pedis Palpable: [Right:Yes] Electronic Signature(s) Signed: 10/01/2019 5:40:16 PM By: Deon Pilling Entered By: Deon Pilling on 10/01/2019 08:04:53 -------------------------------------------------------------------------------- Multi Wound Chart Details Patient Name: Date of Service: Allen, Gregory 10/01/2019 8:00 AM Medical Record [id number] Patient Account Number: [id number] Date of Birth/Sex: Treating RN: 1961/05/23 (59 y.o. [name] Primary Care Herbert Aguinaldo: PATIENT, NO Other Clinician: Referring Savion Washam: Treating Venia Riveron/Extender:Robson, Esperanza Richters in Treatment: 12 Vital Signs Height(in): 78 Pulse(bpm): 60 Weight(lbs): 285 Blood Pressure(mmHg): 136/67 Body Mass Index(BMI): 33 Temperature(F):  98.2 Respiratory 18 Rate(breaths/min): Photos: [1:No Photos] [N/A:N/A] Wound Location: [1:Right, Plantar Toe Great] [N/A:N/A] Wounding Event: [1:Gradually Appeared] [N/A:N/A] Primary Etiology: [1:Diabetic Wound/Ulcer of the N/A Lower Extremity] Comorbid History: [1:Pneumothorax, Deep Vein N/A Thrombosis, Type II Diabetes, Neuropathy] Date Acquired: [1:01/20/2019] [N/A:N/A] Weeks of Treatment: [1:12] [N/A:N/A] Wound Status: [1:Open] [N/A:N/A] Measurements L x W x D 0.3x0.3x0.4 [N/A:N/A] (cm) Area (cm) : [1:0.071] [N/A:N/A] Volume (cm) : [1:0.028] [N/A:N/A] % Reduction in Area: [1:84.90%] [N/A:N/A] % Reduction in Volume: 88.10% [N/A:N/A] Classification: [1:Grade 2] [N/A:N/A] Exudate Amount: [1:Small] [N/A:N/A] Exudate Type: [1:Serosanguineous] [N/A:N/A] Exudate Color: [1:red, brown] [N/A:N/A] Wound Margin: [1:Thickened] [N/A:N/A] Granulation Amount: [1:Large (67-100%)] [N/A:N/A] Granulation Quality: [1:Red] [N/A:N/A]  Necrotic Amount: [1:None Present (0%)] [N/A:N/A] Exposed Structures: [1:Fat Layer (Subcutaneous N/A Tissue) Exposed: Yes Fascia: No Tendon: No Muscle: No Joint: No Bone: No] Epithelialization: [1:Medium (34-66%)] [N/A:N/A] Debridement: [1:Debridement - Excisional N/A] Pre-procedure [1:08:39] [N/A:N/A] Verification/Time Out Taken: Tissue Debrided: [1:Callus, Subcutaneous] [N/A:N/A] Level: [1:Skin/Subcutaneous Tissue] [N/A:N/A] Debridement Area (sq cm):0.09 [N/A:N/A] Instrument: [1:Curette] [N/A:N/A] Bleeding: [1:Moderate] [N/A:N/A] Hemostasis Achieved: [1:Silver Nitrate] [N/A:N/A] Procedural Pain: [1:0] [N/A:N/A] Post Procedural Pain: [1:0] [N/A:N/A] Debridement Treatment Procedure was tolerated [N/A:N/A] Response: [1:well] Post Debridement [1:0.3x0.3x0.4] [N/A:N/A] Measurements L x W x D (cm) Post Debridement [1:0.028] [N/A:N/A] Volume: (cm) Assessment Notes: [1:callus to periwound.] [N/A:N/A N/A] Treatment Notes Wound #1 (Right, Plantar Toe Great) 1.  Cleanse With Wound Cleanser 3. Primary Dressing Applied Calcium Alginate Ag 4. Secondary Dressing Dry Gauze Roll Gauze Foam 5. Secured With Medipore tape 7. Footwear/Offloading device applied Felt/Foam Wedge shoe Notes felt as secondary. Electronic Signature(s) Signed: 10/01/2019 6:12:10 PM By: Zandra Abts RN, BSN Signed: 10/02/2019 5:51:00 PM By: Baltazar Najjar MD Entered By: Baltazar Najjar on 10/01/2019 09:08:05 -------------------------------------------------------------------------------- Multi-Disciplinary Care Plan Details Patient Name: Date of Service: DRUE, HARR 10/01/2019 8:00 AM Medical Record [id number] Patient Account Number: [id number] Date of Birth/Sex: Treating RN: 1961-06-14 (59 y.o. [name] Primary Care Johari Pinney: PATIENT, NO Other Clinician: Referring Lynnmarie Lovett: Treating Yovan Leeman/Extender:Robson, [name] in Treatment: 12 Active Inactive Wound/Skin Impairment Nursing Diagnoses: Impaired tissue integrity Knowledge deficit related to ulceration/compromised skin integrity Goals: Patient/caregiver will verbalize understanding of skin care regimen Date Initiated: 07/09/2019 Target Resolution Date: 11/02/2019 Goal Status: Active Interventions: Assess patient/caregiver ability to obtain necessary supplies Assess patient/caregiver ability to perform ulcer/skin care regimen upon admission and as needed Assess ulceration(s) every visit Provide education on ulcer and skin care Notes: Electronic Signature(s) Signed: 10/01/2019 6:12:10 PM By: Zandra Abts RN, BSN Entered By: Zandra Abts on 10/01/2019 08:11:51 -------------------------------------------------------------------------------- Pain Assessment Details Patient Name: Date of Service: CARVEL, HUSKINS 10/01/2019 8:00 AM Medical Record [id number] Patient Account Number: [id number] Date of Birth/Sex: Treating RN: [birth year] (59 y.o. [name] Primary Care Jazlyn Tippens:  PATIENT, NO Other Clinician: Referring Curren Mohrmann: Treating Tiffay Pinette/Extender:Robson, [name] in Treatment: 12 Active Problems Location of Pain Severity and Description of Pain Patient Has Paino No Site Locations Rate the pain. Current Pain Level: 0 Pain Management and Medication Current Pain Management: Medication: No Cold Application: No Rest: No Massage: No Activity: No T.E.N.S.: No Heat Application: No Leg drop or elevation: No Is the Current Pain Management Adequate: Adequate How does your wound impact your activities of daily livingo Sleep: No Bathing: No Appetite: No Relationship With Others: No Bladder Continence: No Emotions: No Bowel Continence: No Work: No Toileting: No Drive: No Dressing: No Hobbies: No Electronic Signature(s) Signed: 10/01/2019 5:40:16 PM By: Shawn Stall Entered By: Shawn Stall on 10/01/2019 08:04:33 -------------------------------------------------------------------------------- Patient/Caregiver Education Details Patient Name: Date of Service: LYDON, VANSICKLE 4/12/2021andnbsp8:00 AM Medical Record [id number] Patient Account Number: [id number] Date of Birth/Gender: [birth year] (60 y.o. M) Treating RN: Zandra Abts Primary Care Physician: PATIENT, NO Other Clinician: Referring Physician: Treating Physician/Extender:Robson, [name] in Treatment: 12 Education Assessment Education Provided To: Patient Education Topics Provided Wound/Skin Impairment: Methods: Explain/Verbal Responses: State content correctly Electronic Signature(s) Signed: 10/01/2019 6:12:10 PM By: Zandra Abts RN, BSN Entered By: Zandra Abts on 10/01/2019 08:12:01 -------------------------------------------------------------------------------- Wound Assessment Details Patient Name: Date of Service: JARIOUS, LYON 10/01/2019 8:00 AM Medical Record [id number] Patient Account Number: [id number] Date of Birth/Sex: Treating RN: 08/17/1960  (59 y.o. [name] Primary Care Miley Lindon: PATIENT, NO Other Clinician: Referring Carissa Musick: Treating Kanaya Gunnarson/Extender:Robson, Casimiro Needle  Weeks in Treatment: 12 Wound Status Wound Number: 1 Primary Diabetic Wound/Ulcer of the Lower Etiology: Extremity Wound Location: Right, Plantar Toe Great Wound Open Wounding Event: Gradually Appeared Status: Date Acquired: 01/20/2019 Comorbid Pneumothorax, Deep Vein Thrombosis, Type Weeks Of Treatment: 12 History: II Diabetes, Neuropathy Clustered Wound: No Photos Photo Uploaded By: Benjaman Kindler on 10/02/2019 14:02:20 Wound Measurements Length: (cm) 0.3 % Reductio Width: (cm) 0.3 % Reductio Depth: (cm) 0.4 Epithelial Area: (cm) 0.071 Tunneling Volume: (cm) 0.028 Undermini Wound Description Classification: Grade 2 Wound Margin: Thickened Exudate Amount: Small Exudate Type: Serosanguineous Exudate Color: red, brown Wound Bed Granulation Amount: Large (67-100%) Granulation Quality: Red Necrotic Amount: None Present (0%) Foul Odor After Cleansing: No Slough/Fibrino No Exposed Structure Fascia Exposed: No Fat Layer (Subcutaneous Tissue) Exposed: Yes Tendon Exposed: No Muscle Exposed: No Joint Exposed: No Bone Exposed: No n in Area: 84.9% n in Volume: 88.1% ization: Medium (34-66%) : No ng: No Assessment Notes callus to periwound. Treatment Notes Wound #1 (Right, Plantar Toe Great) 1. Cleanse With Wound Cleanser 3. Primary Dressing Applied Calcium Alginate Ag 4. Secondary Dressing Dry Gauze Roll Gauze Foam 5. Secured With Medipore tape 7. Footwear/Offloading device applied Felt/Foam Wedge shoe Notes felt as secondary. Electronic Signature(s) Signed: 10/01/2019 5:40:16 PM By: Shawn Stall Entered By: Shawn Stall on 10/01/2019 08:07:23 -------------------------------------------------------------------------------- Vitals Details Patient Name: Date of Service: EMANUELE, MCWHIRTER 10/01/2019 8:00 AM Medical  Record [id number] Patient Account Number: [id number] Date of Birth/Sex: Treating RN: 17-Mar-1961 (59 y.o. [name] Primary Care Maesyn Frisinger: PATIENT, NO Other Clinician: Referring Myrle Dues: Treating Bellah Alia/Extender:Robson, [name] in Treatment: 12 Vital Signs Time Taken: 08:02 Temperature (F): 98.2 Height (in): 78 Pulse (bpm): 60 Weight (lbs): 285 Respiratory Rate (breaths/min): 18 Body Mass Index (BMI): 32.9 Blood Pressure (mmHg): 136/67 Reference Range: 80 - 120 mg / dl Electronic Signature(s) Signed: 10/01/2019 5:40:16 PM By: Shawn Stall Entered By: Shawn Stall on 10/01/2019 08:07:40

## 2019-10-02 NOTE — Progress Notes (Signed)
Allen Gregory ([id number]) Visit Report for 10/01/2019 Debridement Details Patient Name: Date of Service: Allen Gregory, Allen Gregory 10/01/2019 8:00 AM Medical Record [id number] Patient Account Number: [id number] Date of Birth/Sex: Treating RN: [birth year] (59 y.o. Allen Gregory Primary Care Provider: PATIENT, NO Other Clinician: Referring Provider: Treating Provider/Extender:Astou Lada, [name] in Treatment: 12 Debridement Performed for Wound #1 Right,Plantar Toe Great Assessment: Performed By: Physician Maxwell Caul., MD Debridement Type: Debridement Severity of Tissue Pre Fat layer exposed Debridement: Level of Consciousness (Pre- Awake and Alert procedure): Pre-procedure Verification/Time Out Taken: Yes - 08:39 Start Time: 08:39 Total Area Debrided (L x W): 0.3 (cm) x 0.3 (cm) = 0.09 (cm) Tissue and other material Viable, Non-Viable, Callus, Subcutaneous debrided: Level: Skin/Subcutaneous Tissue Debridement Description: Excisional Instrument: Curette Bleeding: Moderate Hemostasis Achieved: Silver Nitrate End Time: 08:40 Procedural Pain: 0 Post Procedural Pain: 0 Response to Treatment: Procedure was tolerated well Level of Consciousness Awake and Alert (Post-procedure): Post Debridement Measurements of Total Wound Length: (cm) 0.3 Width: (cm) 0.3 Depth: (cm) 0.4 Volume: (cm) 0.028 Character of Wound/Ulcer Post Improved Debridement: Severity of Tissue Post Debridement: Fat layer exposed Post Procedure Diagnosis Same as Pre-procedure Electronic Signature(s) Signed: 10/01/2019 6:12:10 PM By: Zandra Abts RN, BSN Signed: 10/02/2019 5:51:00 PM By: Baltazar Najjar MD Entered By: Baltazar Najjar on 10/01/2019 09:08:14 -------------------------------------------------------------------------------- HPI Details Patient Name: Date of Service: Allen Gregory, Allen Gregory 10/01/2019 8:00 AM Medical Record [id number] Patient Account Number: [id number] Date of  Birth/Sex: Treating RN: 05-29-1961 (59 y.o. Allen Gregory Primary Care Provider: PATIENT, NO Other Clinician: Referring Provider: Treating Provider/Extender:Caitlynne Harbeck, [name] in Treatment: 12 History of Present Illness HPI Description: ADMISSION 07/09/2019 This is a pleasant 59 year old man who referred himself here for second opinion sign a new area on the right plantar first toe and the dorsal part of his right fifth toe. He says he had these in August when he had removed callus from the first toe and then played pool volleyball for about 4 hours. He thinks the bottom of the pool simply caused an abrasion of the toe. The story sounds accurate. He has been going to podiatry for the last several months. Me a picture on his phone from August and the wound is gotten considerably smaller. He states that it started doing well recently when they started collagen. He has been using a surgical shoe to offload. 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 He had a brace for a long time but now he simply lifts his foot higher to clear but states that most people would not even notice that he had any disability. He is a type 2 diabetes Badik but he has diet-controlled he has lost weight by watching calories. 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 07/16/2019; right fifth toe is closed. Right first toe is smaller. He is using a surgical shoe but he tells me is fairly religious about using his scooter at home. Hopefully this will give him enough offloading to close this. 2/1; right fifth toe dorsally remains closed. Right first toe still not a lot of improvement. Although superficially it looks smaller  there is undermining laterally from 6-6 of at least 3 to 4 mm. Also worrisome is that the granulation does  not look that healthy. We have been using silver collagen He had his foot x-rayed by his podiatrist but I do not have access to this. I am going to x-ray the right great toe again. Otherwise I am thinking he is going to require a total contact cast probably next week and I discussed this with him today. 2/9; his x-ray of the right foot did not show plain x-ray evidence of osteomyelitis. We have been using silver alginate the wound is not improved looks static. He is going to get a total contact cast today He comes in today telling me that before he left podiatry before he came here he had noninvasive arterial studies that were done by a mobile service and the podiatrist office. They are now trying to arrange I think an angiogram however this would be in [facility] He asked my opinion on this and I told him that angiograms are done by several different doctor groups locally and if he is from [place] I cannot see a reason to go out of [facility] for any procedure he might need. We will see if we can get the actual angiogram report and I will refer him as needed to either vein and vascular or interventional cardiology 2/12; back for his first obligatory total contact cast change. I did get his arterial studies from in [facility]. Biphasic waveforms ABI in the right of 1.16 on the left at 1.14 I am not really sure what was so concerning about this [place] I will simply repeat these studies along with TBI's locally. There should not be any need for him to go out of town to have this evaluation. I am not sure that there is a clinical need 2/16; once again the total contact cast was too loose he has multiple skin excoriations where things were rubbing. He claims to not be on this all that much and is working from home  but between the size of his foot the wasting in the distal part of his lower leg and large calfs we just cannot mold the cast in to fit his leg properly.-Changed him to a forefoot off loader but with his foot drop that may not be possible. 2/23; we put him out in a surgical shoe last week as we did not have a forefoot off loader. We have this today. We are using silver alginate. He did not tolerate a total contact cast 3/1; he is in a forefoot offloading boot. Using silver collagen as of last week. No major change 3/8; he is using a forefoot offloading boot. Silver collagen over the last 2 weeks. Perhaps some reduction in depth. Moreover surface looks healthy 3/15; he is a forefoot offloading boot. Silver collagen over the last 3 weeks. There has been reduction in depth. Much less circumferential callus and thick subcutaneous tissue. 3/22; his original wound on the plantar right great toe is somewhat improved. However he arrives in clinic today with a completely denuded ulcer on the tip of the toe distal to the original wound. More concerning is there is loss of surface epithelium over a wide area around this even dorsally on the toe. He states that he last wrapped the area on Saturday. He was up at [facility] walking this weekend otherwise he had not done anything different. Noticeable for the fact that his forefoot offloading shoe was not in good condition fact that split open this could  have had something to do with this but I have no doubt that there is also some degree of infection/cellulitis 3/29; patient came to the clinic last week with a new wound on the tip of the right great toe. This looked infected there was epithelial loss extending to the base of the first toe dorsally on the right. I gave him empiric doxycycline. Culture I did showed methicillin sensitive staph aureus Proteus and group B strep. I gave him doxycycline which is not a reliable cover of strep or Proteus. I am going to  give him a course of Augmentin today. He has not been systemically unwell. 4/5; original wound on the plantar right great toe. 2 weeks ago had a new wound on the tip of the right great toe with extensive cellulitis. Culture I did at that time ultimately showed methicillin sensitive staph aureus Proteus and group B strep. He received some doxycycline I changed him to Augmentin last week he has 3 more days. He is complaining of some pruritus but no rash. X-ray; showed no bony abnormalities in the right great toe. 4/12; no evidence of infection. Right great toe now working on the original wound. The new wound at the tip of the toe is callused but no open area. Electronic Signature(s) Signed: 10/02/2019 5:51:00 PM By: Baltazar Najjar MD Entered By: Baltazar Najjar on 10/01/2019 09:08:42 -------------------------------------------------------------------------------- Physical Exam Details Patient Name: Date of Service: Allen Gregory, Allen Gregory 10/01/2019 8:00 AM Medical Record [id number] Patient Account Number: [id number] Date of Birth/Sex: Treating RN: 12/28/1960 (59 y.o. Allen Gregory Primary Care Provider: PATIENT, NO Other Clinician: Referring Provider: Treating Provider/Extender:Decorey Wahlert, [name] in Treatment: 12 Notes Wound exam The area on the tip of the toe remains closed albeit callused. Still the original wound measuring smaller. Still requiring debridement of callus nonviable edges. Hemostasis with the silver nitrate. Post debridement the surface of the wound looks healthy Electronic Signature(s) Signed: 10/02/2019 5:51:00 PM By: Baltazar Najjar MD Entered By: Baltazar Najjar on 10/01/2019 09:09:17 -------------------------------------------------------------------------------- Physician Orders Details Patient Name: Date of Service: TRASK, VOSLER 10/01/2019 8:00 AM Medical Record [id number] Patient Account Number: [id number] Date of Birth/Sex: Treating RN: 1961/02/13  (59 y.o. 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 Dressing Change Frequency Wound #1 Right,Plantar Toe Great Change Dressing every other day. Wound Cleansing May shower and wash wound with soap and water. Primary Wound Dressing Wound #1 Right,Plantar Toe Great Calcium Alginate with Silver Secondary Dressing Wound #1 Right,Plantar Toe Great Foam - foam donut Kerlix/Rolled Gauze - secure with tape Dry Gauze Off-Loading Wedge shoe to: - right foot Electronic Signature(s) Signed: 10/01/2019 6:12:10 PM By: Zandra Abts RN, BSN Signed: 10/02/2019 5:51:00 PM By: Baltazar Najjar MD Entered By: Zandra Abts on 10/01/2019 08:11:34 -------------------------------------------------------------------------------- Problem List Details Patient Name: Date of Service: INGRAM, ONNEN 10/01/2019 8:00 AM Medical Record [id number] Patient Account Number: [id number] Date of Birth/Sex: Treating RN: [birth year] (59 y.o. Allen Gregory Primary Care Provider: PATIENT, NO Other Clinician: Referring Provider: Treating Provider/Extender:Arvil Utz, [name] in Treatment: 12 Active Problems ICD-10 Evaluated Encounter Code Description Active Date Today Diagnosis E11.621 Type 2 diabetes mellitus  with foot ulcer 07/09/2019 No Yes L97.512 Non-pressure chronic ulcer of other part of right foot 07/09/2019 No Yes with fat layer exposed E11.42 Type 2 diabetes mellitus with diabetic polyneuropathy  07/09/2019 No Yes M21.371 Foot drop, right foot 07/09/2019 No Yes L03.031 Cellulitis of right toe 09/10/2019 No Yes Inactive Problems ICD-10 Code Description Active Date Inactive Date E11.51 Type 2 diabetes mellitus with diabetic peripheral angiopathy 07/31/2019 07/31/2019 without gangrene Resolved Problems Electronic Signature(s) Signed: 10/02/2019 5:51:00 PM By: Baltazar Najjar MD Entered By: Baltazar Najjar on 10/01/2019 09:07:59 -------------------------------------------------------------------------------- Progress Note Details Patient Name: Date of Service: Allen Gregory, Allen Gregory 10/01/2019 8:00 AM Medical Record [id number] Patient Account Number: [id number] Date of Birth/Sex: Treating RN: [birth year] (59 y.o. Allen Gregory Primary Care Provider: PATIENT, NO Other Clinician: Referring Provider: Treating Provider/Extender:Maya Scholer, [name] in Treatment: 12 Subjective History of Present Illness (HPI) ADMISSION 07/09/2019 This is a pleasant 59 year old man who referred himself here for second opinion sign a new area on the right plantar first toe and the dorsal part of his right fifth toe. He says he had these in August when he had removed callus from the first toe and then played pool volleyball for about 4 hours. He thinks the bottom of the pool simply caused an abrasion of the toe. The story sounds accurate. He has been going to podiatry for the last several months. Me a picture on his phone from August and the wound is gotten considerably smaller. He states that it started doing well recently when they started collagen. He has been using a surgical shoe to offload. 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 He had a brace for a long time but now he simply lifts his foot higher to clear but states that most people would not even  notice that he had any disability. He is a type 2 diabetes Badik but he has diet-controlled he has lost weight by watching calories. 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 07/16/2019; right fifth toe is closed. Right first toe is smaller. He is using a surgical shoe but he tells me is fairly religious about using his scooter at home. Hopefully this will give him enough offloading to close this. 2/1; right fifth toe dorsally remains closed. Right first toe still not a lot of improvement. Although superficially it looks smaller there is undermining laterally from 6-6 of at least 3 to 4 mm. Also worrisome is that the granulation does not look that healthy. We have been using silver collagen He had his foot x-rayed by his podiatrist but I do not have access to this. I am going to x-ray the right great toe again. Otherwise I am thinking he is going to require a total contact cast probably next week and I discussed this with him today. 2/9; his x-ray of the right foot did not show plain x-ray evidence of osteomyelitis. We have been using silver alginate the wound is not improved looks static. He is going to get a total contact cast today He comes in today telling me that before he left podiatry before he came here he had noninvasive arterial studies that were done by a mobile service and the podiatrist office. They are now  trying to arrange I think an angiogram however this would be in [facility]. He asked my opinion on this and I told him that angiograms are done by several different doctor groups locally and if he is from [place] I cannot see a reason to go out of [facility] for any procedure he might need. We will see if we can get the actual angiogram report and I will refer him as needed to either vein and vascular or interventional  cardiology 2/12; back for his first obligatory total contact cast change. I did get his arterial studies from [facility]. Biphasic waveforms ABI in the right of 1.16 on the left at 1.14 I am not really sure what was so concerning about this [place] I will simply repeat these studies along with TBI's locally. There should not be any need for him to go out of town to have this evaluation. I am not sure that there is a clinical need 2/16; once again the total contact cast was too loose he has multiple skin excoriations where things were rubbing. He claims to not be on this all that much and is working from home but between the size of his foot the wasting in the distal part of his lower leg and large calfs we just cannot mold the cast in to fit his leg properly.-Changed him to a forefoot off loader but with his foot drop that may not be possible. 2/23; we put him out in a surgical shoe last week as we did not have a forefoot off loader. We have this today. We are using silver alginate. He did not tolerate a total contact cast 3/1; he is in a forefoot offloading boot. Using silver collagen as of last week. No major change 3/8; he is using a forefoot offloading boot. Silver collagen over the last 2 weeks. Perhaps some reduction in depth. Moreover surface looks healthy 3/15; he is a forefoot offloading boot. Silver collagen over the last 3 weeks. There has been reduction in depth. Much less circumferential callus and thick subcutaneous tissue. 3/22; his original wound on the plantar right great toe is somewhat improved. However he arrives in clinic today with a completely denuded ulcer on the tip of the toe distal to the original wound. More concerning is there is loss of surface epithelium over a wide area around this even dorsally on the toe. He states that he last wrapped the area on Saturday. He was up at [facility] walking this weekend  otherwise he had not done anything different. Noticeable for the fact that his forefoot offloading shoe was not in good condition fact that split open this could have had something to do with this but I have no doubt that there is also some degree of infection/cellulitis 3/29; patient came to the clinic last week with a new wound on the tip of the right great toe. This looked infected there was epithelial loss extending to the base of the first toe dorsally on the right. I gave him empiric doxycycline. Culture I did showed methicillin sensitive staph aureus Proteus and group B strep. I gave him doxycycline which is not a reliable cover of strep or Proteus. I am going to give him a course of Augmentin today. He has not been systemically unwell. 4/5; original wound on the plantar right great toe. 2 weeks ago had a new wound on the tip of the right  great toe with extensive cellulitis. Culture I did at that time ultimately showed methicillin sensitive staph aureus Proteus and group B strep. He received some doxycycline I changed him to Augmentin last week he has 3 more days. He is complaining of some pruritus but no rash. X-ray; showed no bony abnormalities in the right great toe. 4/12; no evidence of infection. Right great toe now working on the original wound. The new wound at the tip of the toe is callused but no open area. Objective Constitutional Vitals Time Taken: 8:02 AM, Height: 78 in, Weight: 285 lbs, BMI: 32.9, Temperature: 98.2 F, Pulse: 60 bpm, Respiratory Rate: 18 breaths/min, Blood Pressure: 136/67 mmHg. Integumentary (Hair, Skin) Wound #1 status is Open. Original cause of wound was Gradually Appeared. The wound is located on the [employer]. The wound measures 0.3cm length x 0.3cm width x 0.4cm depth; 0.071cm^2 area and 0.028cm^3 volume. There is Fat Layer (Subcutaneous Tissue) Exposed exposed. There is no tunneling or undermining noted. There is a small amount of  serosanguineous drainage noted. The wound margin is thickened. There is large (67-100%) red granulation within the wound bed. There is no necrotic tissue within the wound bed. General Notes: callus to periwound. 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 There was a Excisional Skin/Subcutaneous Tissue Debridement with a total area of 0.09 sq cm performed by Ricard Dillon., MD. With the following instrument(s): Curette to remove Viable and Non-Viable tissue/material. Material removed includes Callus and Subcutaneous Tissue and. No specimens were taken. A time out was conducted at 08:39, prior to the start of the procedure. A Moderate amount of bleeding was controlled with Silver Nitrate. The procedure was tolerated well with a pain level of 0 throughout and a pain level of 0 following the procedure. Post Debridement Measurements: 0.3cm length x 0.3cm width x 0.4cm depth; 0.028cm^3 volume. Character of Wound/Ulcer Post Debridement is improved. Severity of Tissue Post Debridement is: Fat layer exposed. Post procedure Diagnosis Wound #1: Same as Pre-Procedure Plan Follow-up Appointments: Return Appointment in 1 week. Dressing Change Frequency: Wound #1 Right,Plantar Toe Great: Change Dressing every other day. Wound Cleansing: May shower and wash wound with soap and water. Primary Wound Dressing: Wound #1 Right,Plantar Toe Great: Calcium Alginate with Silver Secondary Dressing: Wound #1 Right,Plantar Toe Great: Foam - foam donut Kerlix/Rolled Gauze - secure with tape Dry  Gauze Off-Loading: Wedge shoe to: - right foot 1. Continue with silver alginate 2. He has a wedge shoe for the right foot 3. No more aggressive offloading possible, he did not tolerate a total contact cast Electronic Signature(s) Signed: 10/02/2019 5:51:00 PM By: Linton Ham MD Entered By: Linton Ham on 10/01/2019 09:09:52 -------------------------------------------------------------------------------- SuperBill Details Patient Name: Date of Service: KIEN, MIRSKY 10/01/2019 Medical Record [id number] Patient Account Number: [id number] Date of Birth/Sex: Treating RN: [birth year] (59 y.o. [name] Primary Care Provider: PATIENT, NO Other Clinician: Referring Provider: Treating Provider/Extender:Yer Olivencia, Esperanza Richters in Treatment: 12 Diagnosis Coding ICD-10 Codes Code Description E11.621 Type 2 diabetes mellitus with foot ulcer L97.512 Non-pressure chronic ulcer of other part of right foot with fat layer exposed E11.42 Type 2 diabetes mellitus  with diabetic polyneuropathy M21.371 Foot drop, right foot L03.031 Cellulitis of right toe Facility Procedures CPT4 Code Description: [id number] 11042 - DEB SUBQ TISSUE 20 SQ CM/< ICD-10 Diagnosis Description E11.621 Type 2 diabetes mellitus with foot ulcer L97.512 Non-pressure chronic ulcer of other part of right foot with Modifier: fat layer e Quantity: 1 xposed Physician Procedures CPT4 Code Description: [id number] 11042 - WC PHYS SUBQ TISS 20 SQ CM ICD-10 Diagnosis Description E11.621 Type 2 diabetes mellitus with foot ulcer L97.512 Non-pressure chronic ulcer of other part of right foot with Modifier: fat layer e Quantity: 1 xposed Electronic Signature(s) Signed: 10/02/2019 5:51:00 PM By: Baltazar Najjarobson, Korrin Waterfield MD Entered By: Baltazar Najjarobson, Korrie Hofbauer on 10/01/2019 09:10:08

## 2019-10-03 NOTE — Progress Notes (Signed)
MOKSH, LOOMER ([id number]) Visit Report for 09/10/2019 Arrival Information Details Patient Name: Date of Service: JIE, STICKELS 09/10/2019 8:00 AM Medical Record [id number] Patient Account Number: [id number] Date of Birth/Sex: Treating RN: [birth year] (59 y.o. [name] Primary Care Delaynie Stetzer: PATIENT, NO Other Clinician: Referring Siddharth Babington: Treating Haig Gerardo/Extender:Robson, [name] in Treatment: 9 Visit Information History Since Last Visit Added or deleted any medications: No Patient Arrived: Ambulatory Any new allergies or adverse reactions: No Arrival Time: 08:01 Had a fall or experienced change in No Accompanied By: self activities of daily living that may affect Transfer Assistance: None risk of falls: Patient Identification Verified: Yes Signs or symptoms of abuse/neglect since last No Secondary Verification Process Yes visito Completed: Hospitalized since last visit: No Patient Requires Transmission- No Implantable device outside of the clinic excluding No Based Precautions: cellular tissue based products placed in the center Patient Has Alerts: Yes since last visit: Patient Alerts: Patient on Blood Has Dressing in Place as Prescribed: Yes Thinner Pain Present Now: No Electronic Signature(s) Signed: 10/03/2019 9:21:08 AM By: Karl Ito Entered By: Karl Ito on 09/10/2019 08:01:59 -------------------------------------------------------------------------------- Encounter Discharge Information Details Patient Name: Date of Service: JAYVON, MOUNGER 09/10/2019 8:00 AM Medical Record [id number] Patient Account Number: [id number] Date of Birth/Sex: Treating RN: [birth year] (59 y.o. [name] Primary Care Berk Pilot: PATIENT, NO Other Clinician: Referring Avalyn Molino: Treating Caytlyn Evers/Extender:Robson, [name] in Treatment: 9 Encounter Discharge Information Items Post Procedure Vitals Discharge Condition:  Stable Temperature (F): 98.2 Ambulatory Status: Ambulatory Pulse (bpm): 83 Discharge Destination: Home Respiratory Rate (breaths/min): 18 Transportation: Private Auto Blood Pressure (mmHg): 142/65 Accompanied By: self Schedule Follow-up Appointment: Yes Clinical Summary of Care: Patient Declined Electronic Signature(s) Signed: 09/10/2019 5:21:34 PM By: Cherylin Mylar Entered By: Cherylin Mylar on 09/10/2019 08:59:26 -------------------------------------------------------------------------------- Lower Extremity Assessment Details Patient Name: Date of Service: RAYMONT, ANDREONI 09/10/2019 8:00 AM Medical Record [id number] Patient Account Number: [id number] Date of Birth/Sex: Treating RN: [birth year] (58 y.o. [name]) Yevonne Pax Primary Care Ashaunti Treptow: PATIENT, NO Other Clinician: Referring Zanasia Hickson: Treating Amsi Grimley/Extender:Robson, [name] in Treatment: 9 Edema Assessment Assessed: [Left: No] [Right: No] Edema: [Left: N] [Right: o] Calf Left: Right: Point of Measurement: 38 cm From Medial Instep cm 45 cm Ankle Left: Right: Point of Measurement: 10 cm From Medial Instep cm 24 cm Electronic Signature(s) Signed: 09/10/2019 5:05:11 PM By: Yevonne Pax RN Entered By: Yevonne Pax on 09/10/2019 08:06:15 -------------------------------------------------------------------------------- Multi Wound Chart Details Patient Name: Date of Service: JASKARN, SCHWEER 09/10/2019 8:00 AM Medical Record [id number] Patient Account Number: [id number] Date of Birth/Sex: Treating RN: 27-Aug-1960 (59 y.o. 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] [3:Lower Extremity] Comorbid History: [1:Pneumothorax, Deep Vein Pneumothorax, Deep Vein N/A Thrombosis, Type II Diabetes, Neuropathy] [3:Thrombosis, Type II Diabetes, Neuropathy] Date Acquired: [1:01/20/2019] [3:09/10/2019] [N/A:N/A] Weeks of Treatment: [1:9] [3:0] [N/A:N/A] Wound Status: [1:Open] [3:Open] [N/A:N/A] Measurements L x W x D 0.5x0.4x0.5 [3:1.7x2x0.1] [N/A:N/A] (cm) Area (cm) : [1:0.157] [3:2.67] [N/A:N/A] Volume (cm) : [1:0.079] [3:0.267] [N/A:N/A] % Reduction in Area: [1:66.70%] [3:N/A] [N/A:N/A] % Reduction in Volume: 66.50% [3:N/A] [N/A:N/A] Classification: [1:Grade 2] [3:Grade 2] [N/A:N/A] Exudate Amount: [1:Small] [3:Medium] [N/A:N/A] Exudate Type: [1:Serosanguineous] [3:Serosanguineous] [  N/A:N/A] Exudate Color: [1:red, brown] [3:red, brown] [N/A:N/A] Wound Margin: [1:Thickened] [3:N/A] [N/A:N/A] Granulation Amount: [1:Medium (34-66%)] [3:Medium (34-66%)] [N/A:N/A] Granulation Quality: [1:Red] [3:Pink, Pale] [N/A:N/A] Necrotic Amount: [1:Medium (34-66%)] [3:Medium (34-66%)] [N/A:N/A] Exposed Structures: [1:Fat Layer (Subcutaneous Fat Layer (Subcutaneous N/A Tissue) Exposed: Yes Fascia: No Tendon: No Muscle: No Joint: No Bone: No] [3:Tissue) Exposed: Yes Fascia: No Tendon: No Muscle: No Joint: No Bone: No] Epithelialization: [1:Small (1-33%)] [3:None] [N/A:N/A] Debridement: [1:Debridement - Excisional Debridement -] [3:Selective/Open Wound] [N/A:N/A] Pre-procedure [1:08:37] [3:08:37] [N/A:N/A] Verification/Time Out Taken: Pain Control: [1:Lidocaine 5% topical ointment] [3:Lidocaine 5% topical ointment] [N/A:N/A] Tissue Debrided: [1:Callus, Subcutaneous] [3:N/A] [N/A:N/A] Level: [1:Skin/Subcutaneous Tissue Skin/Epidermis] [N/A:N/A] Debridement Area  (sq cm):0.2 [3:3.4] [N/A:N/A] Instrument: [1:Curette] [3:Blade, Forceps] [N/A:N/A] Specimen: [1:N/A] [3:Swab] [N/A:N/A] Number of Specimens [1:1] [3:1] [N/A:N/A] Taken: Bleeding: [1:Minimum] [3:Minimum] [N/A:N/A] Hemostasis Achieved: [1:Pressure] [3:Pressure] [N/A:N/A] Procedural Pain: [1:0] [3:0] [N/A:N/A] Post Procedural Pain: [1:0] [3:0] [N/A:N/A] Debridement Treatment Procedure was tolerated [3:Procedure was tolerated] [N/A:N/A] Response: [1:well] [3:well] Post Debridement [1:0.5x0.4x0.5] [3:1.7x2x0.1] [N/A:N/A] Measurements L x W x D (cm) Post Debridement [1:0.079] [9:6.789] [N/A:N/A] Volume: (cm) Procedures Performed: [1:Debridement] [3:Debridement] [N/A:N/A] Treatment Notes Electronic Signature(s) Signed: 09/10/2019 5:18:21 PM By: Levan Hurst RN, BSN Signed: 09/10/2019 5:34:39 PM By: Linton Ham MD Entered By: Linton Ham on 09/10/2019 08:48:06 -------------------------------------------------------------------------------- Multi-Disciplinary Care Plan Details Patient Name: Date of Service: YURI, FANA 09/10/2019 8:00 AM Medical Record [id number] Patient Account Number: [id number] Date of Birth/Sex: Treating RN: May 26, 1961 (59 y.o. [name] Primary Care Jerrine Urschel: PATIENT, NO Other Clinician: Referring Lyell Clugston: Treating Jermani Pund/Extender:Robson, Esperanza Richters in Treatment: 9 Active Inactive Wound/Skin Impairment Nursing Diagnoses: Impaired tissue integrity Knowledge deficit related to ulceration/compromised skin integrity Goals: Patient/caregiver will verbalize understanding of skin care regimen Date Initiated: 07/09/2019 Target Resolution Date: 10/05/2019 Goal Status: Active Interventions: Assess patient/caregiver ability to obtain necessary supplies Assess patient/caregiver ability to perform ulcer/skin care regimen upon admission and as needed Assess ulceration(s) every visit Provide education on ulcer and skin care Notes: Electronic  Signature(s) Signed: 09/10/2019 5:18:21 PM By: Levan Hurst RN, BSN Entered By: Levan Hurst on 09/10/2019 08:57:29 -------------------------------------------------------------------------------- Pain Assessment Details Patient Name: Date of Service: JAYREN, CEASE 09/10/2019 8:00 AM Medical Record [id number] Patient Account Number: [id number] Date of Birth/Sex: Treating RN: 07-30-1960 (59 y.o. [name] Primary Care Sahasra Belue: PATIENT, NO Other Clinician: Referring Annielee Jemmott: Treating Antoine Vandermeulen/Extender:Robson, Esperanza Richters in Treatment: 9 Active Problems Location of Pain Severity and Description of Pain Patient Has Paino No Site Locations Pain Management and Medication Current Pain Management: Electronic Signature(s) Signed: 09/10/2019 5:18:21 PM By: Levan Hurst RN, BSN Signed: 10/03/2019 9:21:08 AM By: Sandre Kitty Entered By: Sandre Kitty on 09/10/2019 08:02:21 -------------------------------------------------------------------------------- Patient/Caregiver Education Details Patient Name: Date of Service: EWALD, BEG 3/22/2021andnbsp8:00 AM Medical Record [id number] Patient Account Number: [id number] Date of Birth/Gender: 1961/05/16 (59 y.o. M) Treating RN: Levan Hurst Primary Care Physician: PATIENT, NO Other Clinician: Referring Physician: Treating Physician/Extender:Robson, Esperanza Richters in Treatment: 9 Education Assessment Education Provided To: Patient Education Topics Provided Wound/Skin Impairment: Methods: Explain/Verbal Responses: State content correctly Electronic Signature(s) Signed: 09/10/2019 5:18:21 PM By: Levan Hurst RN, BSN Entered By: Levan Hurst on 09/10/2019 08:58:55 -------------------------------------------------------------------------------- Wound Assessment Details Patient Name: Date of Service: JAKEVION, ARNEY 09/10/2019 8:00 AM Medical Record [id number] Patient Account Number: [id number] Date  of Birth/Sex: Treating RN: 15-Mar-1961 (59 y.o. [name] Primary Care Anadia Helmes: PATIENT, NO Other Clinician: Referring Aryahna Spagna: Treating Kelcey Korus/Extender:Robson, Esperanza Richters in Treatment: 9 Wound Status Wound Number: 1 Primary Diabetic Wound/Ulcer of the Lower Etiology: Extremity Wound Location: Right, Plantar Toe Great  Wound Open Wounding Event: Gradually Appeared Status: Date Acquired: 01/20/2019 Comorbid Pneumothorax, Deep Vein Thrombosis, Type Weeks Of Treatment: 9 History: II Diabetes, Neuropathy Clustered Wound: No Photos Photo Uploaded By: Benjaman Kindler on 09/11/2019 14:29:32 Wound Measurements Length: (cm) 0.5 Width: (cm) 0.4 Depth: (cm) 0.5 Area: (cm) 0.157 Volume: (cm) 0.079 Wound Description Classification: Grade 2 Wound Margin: Thickened Exudate Amount: Small Exudate Type: Serosanguineous Exudate Color: red, brown Wound Bed Granulation Amount: Medium (34-66%) Granulation Quality: Red Necrotic Amount: Medium (34-66%) Necrotic Quality: Adherent Slough r After Cleansing: No ibrino Yes Exposed Structure posed: No (Subcutaneous Tissue) Exposed: Yes posed: No posed: No osed: No sed: No % Reduction in Area: 66.7% % Reduction in Volume: 66.5% Epithelialization: Small (1-33%) Tunneling: No Undermining: No Foul Odo Slough/F Fascia Ex Fat Layer Tendon Ex Muscle Ex Joint Exp Bone Expo Electronic Signature(s) Signed: 09/11/2019 3:59:58 PM By: Benjaman Kindler EMT/HBOT Signed: 10/03/2019 9:02:47 AM By: Zandra Abts RN, BSN Previous Signature: 09/10/2019 5:05:11 PM Version By: Yevonne Pax RN Entered By: Benjaman Kindler on 09/11/2019 14:29:55 -------------------------------------------------------------------------------- Wound Assessment Details Patient Name: Date of Service: ANDRUE, DINI 09/10/2019 8:00 AM Medical Record [id number] Patient Account Number: [id number] Date of Birth/Sex: Treating RN: 1960-08-30 (58 y.o. [name]) Yevonne Pax Primary Care Diera Wirkkala: PATIENT, NO Other Clinician: Referring Shauntia Levengood: Treating Vickki Igou/Extender:Robson, [name] in Treatment: 9 Wound Status Wound Number: 3 Primary Diabetic Wound/Ulcer of the Lower Etiology: Extremity Wound Location: Right Toe Great Wound Open Wounding Event: Blister Status: Date Acquired: 09/10/2019 Comorbid Pneumothorax, Deep Vein Thrombosis, Type Weeks Of Treatment: 0 History: II Diabetes, Neuropathy Clustered Wound: No Photos Photo Uploaded By: Benjaman Kindler on 09/11/2019 14:29:33 Wound Measurements Length: (cm) 1.7 % Reducti Width: (cm) 2 % Reducti Depth: (cm) 0.1 Epithelia Area: (cm) 2.67 Tunneling: Volume: (cm) 0.267 Underminin Wound Description Classification: Grade 2 Foul Odor Exudate Amount: Medium Slough/Fib Exudate Type: Serosanguineous Exudate Color: red, brown Wound Bed Granulation Amount: Medium (34-66%) Granulation Quality: Pink, Pale Fascia Expo Necrotic Amount: Medium (34-66%) Fat Layer ( Tendon Expo Muscle Expo Joint Expos Bone Expose After Cleansing: No rino Yes Exposed Structure sed: No Subcutaneous Tissue) Exposed: Yes sed: No sed: No ed: No d: No on in Area: on in Volume: lization: None No g: No Electronic Signature(s) Signed: 09/10/2019 5:05:11 PM By: Yevonne Pax RN Entered By: Yevonne Pax on 09/10/2019 08:09:27 -------------------------------------------------------------------------------- Vitals Details Patient Name: Date of Service: KHRIZ, LIDDY 09/10/2019 8:00 AM Medical Record [id number] Patient Account Number: [id number] Date of Birth/Sex: Treating RN: 03-28-1961 (59 y.o. [name] Primary Care Savion Washam: PATIENT, NO Other Clinician: Referring Berit Raczkowski: Treating Almee Pelphrey/Extender:Robson, [name] in Treatment: 9 Vital Signs Time Taken: 08:01 Temperature (F): 98.2 Height (in): 78 Pulse (bpm): 83 Weight (lbs): 285 Respiratory Rate (breaths/min): 18 Body Mass  Index (BMI): 32.9 Blood Pressure (mmHg): 142/65 Reference Range: 80 - 120 mg / dl Electronic Signature(s) Signed: 10/03/2019 9:21:08 AM By: Karl Ito Entered By: Karl Ito on 09/10/2019 08:02:14

## 2019-10-03 NOTE — Progress Notes (Signed)
Allen Gregory ([id number]) Visit Report for 09/17/2019 Arrival Information Details Patient Name: Date of Service: Allen Gregory, Allen Gregory 09/17/2019 8:00 AM Medical Record [id number] Patient Account Number: [id number] Date of Birth/Sex: Treating RN: [birth year] (59 y.o. [name] Primary Care Mathhew Buysse: PATIENT, NO Other Clinician: Referring Bryceson Grape: Treating Heba Ige/Extender:Robson, Esperanza Richters in Treatment: 10 Visit Information History Since Last Visit Added or deleted any medications: No Patient Arrived: Ambulatory Any new allergies or adverse reactions: No Arrival Time: 08:10 Had a fall or experienced change in No Accompanied By: self activities of daily living that may affect Transfer Assistance: None risk of falls: Patient Identification Verified: Yes Signs or symptoms of abuse/neglect since last No Secondary Verification Process Yes visito Completed: Hospitalized since last visit: No Patient Requires Transmission- No Implantable device outside of the clinic excluding No Based Precautions: cellular tissue based products placed in the center Patient Has Alerts: Yes since last visit: Patient Alerts: Patient on Blood Has Dressing in Place as Prescribed: Yes Thinner Pain Present Now: No Electronic Signature(s) Signed: 10/03/2019 9:21:08 AM By: Sandre Kitty Entered By: Sandre Kitty on 09/17/2019 08:11:34 -------------------------------------------------------------------------------- Clinic Level of Care Assessment Details Patient Name: Date of Service: Allen Gregory, Allen Gregory 09/17/2019 8:00 AM Medical Record [id number] Patient Account Number: [id number] Date of Birth/Sex: Treating RN: 02-May-1961 (59 y.o. Ernestene Mention Primary Care Usiel Astarita: PATIENT, NO Other Clinician: Referring Alpheus Stiff: Treating Basia Mcginty/Extender:Robson, Esperanza Richters in Treatment: 10 Clinic Level of Care Assessment Items TOOL 4 Quantity Score []  - Use when only an EandM is  performed on FOLLOW-UP visit 0 ASSESSMENTS - Nursing Assessment / Reassessment X - Reassessment of Co-morbidities (includes updates in patient status) 1 10 X - Reassessment of Adherence to Treatment Plan 1 5 ASSESSMENTS - Wound and Skin Assessment / Reassessment []  - Simple Wound Assessment / Reassessment - one wound 0 X - Complex Wound Assessment / Reassessment - multiple wounds 2 5 []  - Dermatologic / Skin Assessment (not related to wound area) 0 ASSESSMENTS - Focused Assessment []  - Circumferential Edema Measurements - multi extremities 0 []  - Nutritional Assessment / Counseling / Intervention 0 X - Lower Extremity Assessment (monofilament, tuning fork, pulses) 1 5 []  - Peripheral Arterial Disease Assessment (using hand held doppler) 0 ASSESSMENTS - Ostomy and/or Continence Assessment and Care []  - Incontinence Assessment and Management 0 []  - Ostomy Care Assessment and Management (repouching, etc.) 0 PROCESS - Coordination of Care X - Simple Patient / Family Education for ongoing care 1 15 []  - Complex (extensive) Patient / Family Education for ongoing care 0 X - Staff obtains Programmer, systems, Records, Test Results / Process Orders 1 10 []  - Staff telephones HHA, Nursing Homes / Clarify orders / etc 0 []  - Routine Transfer to another Facility (non-emergent condition) 0 []  - Routine Hospital Admission (non-emergent condition) 0 []  - New Admissions / Biomedical engineer / Ordering NPWT, Apligraf, etc. 0 []  - Emergency Hospital Admission (emergent condition) 0 X - Simple Discharge Coordination 1 10 []  - Complex (extensive) Discharge Coordination 0 PROCESS - Special Needs []  - Pediatric / Minor Patient Management 0 []  - Isolation Patient Management 0 []  - Hearing / Language / Visual special needs 0 []  - Assessment of Community assistance (transportation, D/C planning, etc.) 0 []  - Additional assistance / Altered mentation 0 []  - Support Surface(s) Assessment (bed, cushion, seat, etc.)  0 INTERVENTIONS - Wound Cleansing / Measurement []  - Simple Wound Cleansing - one wound 0 X - Complex Wound Cleansing - multiple wounds 2 5 X - Wound Imaging (photographs -  any number of wounds) 1 5 []  - Wound Tracing (instead of photographs) 0 []  - Simple Wound Measurement - one wound 0 X - Complex Wound Measurement - multiple wounds 2 5 INTERVENTIONS - Wound Dressings X - Small Wound Dressing one or multiple wounds 1 10 []  - Medium Wound Dressing one or multiple wounds 0 []  - Large Wound Dressing one or multiple wounds 0 X - Application of Medications - topical 1 5 []  - Application of Medications - injection 0 INTERVENTIONS - Miscellaneous []  - External ear exam 0 []  - Specimen Collection (cultures, biopsies, blood, body fluids, etc.) 0 []  - Specimen(s) / Culture(s) sent or taken to Lab for analysis 0 []  - Patient Transfer (multiple staff / / Similar devices) 0 []  - Simple Staple / Suture removal (25 or less) 0 []  - Complex Staple / Suture removal (26 or more) 0 []  - Hypo / Hyperglycemic Management (close monitor of Blood Glucose) 0 []  - Ankle / Brachial Index (ABI) - do not check if billed separately 0 X - Vital Signs 1 5 Has the patient been seen at the hospital within the last three years: Yes Total Score: 110 Level Of Care: New/Established - Level 3 Electronic Signature(s) Signed: 09/17/2019 5:31:53 PM By: RN, BSN Entered By: on 09/17/2019 09:21:55 -------------------------------------------------------------------------------- Encounter Discharge Information Details Patient Name: Date of Service: Allen Gregory, Allen Gregory 09/17/2019 8:00 AM Medical Record Patient Account Number: Date of Birth/Sex: Treating RN: 1960-10-28 (59 y.o. Primary Care Meriel Kelliher: PATIENT, NO Other Clinician: Referring Britanny Marksberry: Treating Cari [name]/Extender:Robson, in Treatment: 10 Encounter Discharge Information  Items Discharge Condition: Stable Ambulatory Status: Ambulatory Discharge Destination: Home Transportation: Private Auto Accompanied By: self Schedule Follow-up Appointment: Yes Clinical Summary of Care: Electronic Signature(s) Signed: 09/17/2019 4:48:26 PM By: 09/19/2019 Entered By: Zenaida Deed on 09/17/2019 09:29:28 -------------------------------------------------------------------------------- Lower Extremity Assessment Details Patient Name: Date of Service: Allen Gregory, Allen Gregory 09/17/2019 8:00 AM Medical Record 09/19/2019 Patient Account Number: [id number] Date of Birth/Sex: Treating RN: 1961/01/16 (59 y.o. 58 Primary Care Tallon Gertz: PATIENT, NO Other Clinician: Referring Kanyla Omeara: Treating Morton Simson/Extender:Robson, [name] in Treatment: 10 Edema Assessment Assessed: [Left: No] [Right: No] Edema: [Left: N] [Right: o] Calf Left: Right: Point of Measurement: 38 cm From Medial Instep cm 45 cm Ankle Left: Right: Point of Measurement: 10 cm From Medial Instep cm 24 cm Vascular Assessment Pulses: Dorsalis Pedis Palpable: [Right:Yes] Electronic Signature(s) Signed: 09/17/2019 5:06:29 PM By: 09/19/2019 Entered By: Shawn Stall on 09/17/2019 08:31:51 -------------------------------------------------------------------------------- Multi Wound Chart Details Patient Name: Date of Service: Allen Gregory, Allen Gregory 09/17/2019 8:00 AM Medical Record 09/19/2019 Patient Account Number: [id number] Date of Birth/Sex: Treating RN: 1961/03/23 (59 y.o. 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] [3:Lower Extremity] Comorbid History: [1:Pneumothorax, Deep Vein Pneumothorax, Deep Vein N/A Thrombosis, Type II Diabetes, Neuropathy] [3:Thrombosis, Type II Diabetes, Neuropathy] Date Acquired: [1:01/20/2019] [3:09/10/2019] [N/A:N/A] Weeks of Treatment: [1:10] [3:1] [N/A:N/A] Wound Status: [1:Open] [3:Open] [N/A:N/A] Measurements L x W x D 0.5x0.4x0.3 [3:1x1x0.1] [N/A:N/A] (cm) Area (cm) : [1:0.157] [3:0.785] [N/A:N/A] Volume (cm) : [1:0.047] [3:0.079] [N/A:N/A] %  Reduction in Area: [1:66.70%] [3:70.60%] [N/A:N/A] % Reduction in Volume: 80.10% [3:70.40%] [N/A:N/A] Classification: [1:Grade 2] [3:Grade 2] [N/A:N/A] Exudate Amount: [1:Small] [3:Medium] [N/A:N/A] Exudate Type: [1:Serosanguineous] [3:Serosanguineous] [N/A:N/A] Exudate Color: [1:red, brown] [3:red, brown] [N/A:N/A] Wound Margin: [1:Thickened] [3:Distinct, outline attached N/A] Granulation Amount: [1:Medium (34-66%)] [3:Medium (34-66%)] [N/A:N/A] Granulation Quality: [1:Red] [3:Pink, Pale] [N/A:N/A] Necrotic Amount: [1:Medium (34-66%)] [3:Medium (34-66%)] [N/A:N/A] Exposed Structures: [1:Fat Layer (Subcutaneous Fat Layer (Subcutaneous N/A Tissue) Exposed: Yes Fascia: No Tendon: No Muscle: No Joint: No Bone: No Small (1-33%)] [3:Tissue) Exposed: Yes Fascia: No Tendon: No Muscle: No Joint: No Bone: No None] [N/A:N/A] Treatment Notes Electronic Signature(s) Signed: 09/17/2019 5:31:36 PM By: Baltazar Najjar MD Signed: 09/21/2019 5:28:43 PM By: Zandra Abts RN, BSN Entered By: Baltazar Najjar on 09/17/2019 09:22:35 -------------------------------------------------------------------------------- Multi-Disciplinary Care Plan Details Patient Name: Date of Service: Allen Gregory, Allen Gregory 09/17/2019 8:00 AM Medical Record  [id number] Patient Account Number: [id number] Date of Birth/Sex: Treating RN: January 11, 1961 (59 y.o. [name] Primary Care Thaddius Manes: PATIENT, NO Other Clinician: Referring Zoii Florer: Treating Izabelle Daus/Extender:Robson, [name] in Treatment: 10 Active Inactive Wound/Skin Impairment Nursing Diagnoses: Impaired tissue integrity Knowledge deficit related to ulceration/compromised skin integrity Goals: Patient/caregiver will verbalize understanding of skin care regimen Date Initiated: 07/09/2019 Target Resolution Date: 10/05/2019 Goal Status: Active Interventions: Assess patient/caregiver ability to obtain necessary supplies Assess patient/caregiver ability to perform ulcer/skin care regimen upon admission and as needed Assess ulceration(s) every visit Provide education on ulcer and skin care Notes: Electronic Signature(s) Signed: 09/17/2019 5:31:53 PM By: Zenaida Deed RN, BSN Entered By: Zenaida Deed on 09/17/2019 09:11:01 -------------------------------------------------------------------------------- Pain Assessment Details Patient Name: Date of Service: Allen Gregory, Allen Gregory 09/17/2019 8:00 AM Medical Record [id number] Patient Account Number: [id number] Date of Birth/Sex: Treating RN: 1960/12/02 (59 y.o. [name] Primary Care Erian Rosengren: PATIENT, NO Other Clinician: Referring Alanii Ramer: Treating Alycea Segoviano/Extender:Robson, [name] in Treatment: 10 Active Problems Location of Pain Severity and Description of Pain Patient Has Paino No Site Locations Pain Management and Medication Current Pain Management: Electronic Signature(s) Signed: 09/21/2019 5:28:43 PM By: Zandra Abts RN, BSN Signed: 10/03/2019 9:21:08 AM By: Karl Ito Entered By: Karl Ito on 09/17/2019 08:12:00 -------------------------------------------------------------------------------- Patient/Caregiver Education Details Patient Name: Date of Service: Allen Gregory, Allen Gregory  3/29/2021andnbsp8:00 AM Medical Record [id number] Patient Account Number: [id number] Date of Birth/Gender: [birth year] (59 y.o. M) Treating RN: Zenaida Deed Primary Care Physician: PATIENT, NO Other Clinician: Referring Physician: Treating Physician/Extender:Robson, [name] in Treatment: 10 Education Assessment Education Provided To: Patient Education Topics Provided Infection: Methods: Explain/Verbal Responses: Reinforcements needed, State content correctly Offloading: Methods: Explain/Verbal Responses: Reinforcements needed, State content correctly Wound/Skin Impairment: Methods: Explain/Verbal Responses: Reinforcements needed, State content correctly Electronic Signature(s) Signed: 09/17/2019 5:31:53 PM By: Zenaida Deed RN, BSN Entered By: Zenaida Deed on 09/17/2019 09:11:38 -------------------------------------------------------------------------------- Wound Assessment Details Patient Name: Date of Service: Allen Gregory, Allen Gregory 09/17/2019 8:00 AM Medical Record [id number] Patient Account Number: [id number] Date of Birth/Sex: Treating RN: May 12, 1961 (59 y.o. [name] Primary Care Zenobia Kuennen: PATIENT, NO Other Clinician: Referring Mansoor Hillyard: Treating Zyah Gomm/Extender:Robson, [name] in Treatment: 10 Wound Status Wound Number: 1 Primary Diabetic Wound/Ulcer of the Lower Etiology: Extremity Wound Location: Right Toe Great - Plantar Wound Open Wounding Event: Gradually Appeared Status: Date Acquired: 01/20/2019 Comorbid Pneumothorax, Deep Vein Thrombosis, Type Weeks Of Treatment: 10 History: II Diabetes, Neuropathy Clustered Wound: No Photos Photo Uploaded By: Benjaman Kindler on 09/21/2019 16:41:59 Wound Measurements Length: (cm) 0.5 % Reducti Width: (cm) 0.4 % Reduction Depth: (cm) 0.3 Epitheliali Area: (cm) 0.157 Tunneling: Volume: (cm) 0.047 Underminin Wound Description Classification: Grade 2 Wound Margin:  Thickened Exudate  Amount: Small Exudate Type: Serosanguineous Exudate Color: red, brown Wound Bed Granulation Amount: Medium (34-66%) Granulation Quality: Red Necrotic Amount: Medium (34-66%) Necrotic Quality: Adherent Slough Foul Odor After Cleansing: No Slough/Fibrino Yes Exposed Structure Fascia Exposed: No Fat Layer (Subcutaneous Tissue) Exposed: Yes Tendon Exposed: No Muscle Exposed: No Joint Exposed: No Bone Exposed: No on in Area: 66.7% in Volume: 80.1% zation: Small (1-33%) No g: No Electronic Signature(s) Signed: 09/17/2019 5:06:29 PM By: Cherylin Mylar Signed: 09/21/2019 5:28:43 PM By: Zandra Abts RN, BSN Entered By: Cherylin Mylar on 09/17/2019 08:32:33 -------------------------------------------------------------------------------- Wound Assessment Details Patient Name: Date of Service: Allen Gregory, Allen Gregory 09/17/2019 8:00 AM Medical Record [id number] Patient Account Number: [id number] Date of Birth/Sex: Treating RN: 19-Nov-1960 (59 y.o. [name] Primary Care Taylinn Brabant: PATIENT, NO Other Clinician: Referring Kerina Simoneau: Treating Kayliegh Boyers/Extender:Robson, [name] in Treatment: 10 Wound Status Wound Number: 3 Primary Diabetic Wound/Ulcer of the Lower Etiology: Extremity Wound Location: Right Toe Great - Distal Wound Open Wounding Event: Blister Status: Date Acquired: 09/10/2019 Comorbid Pneumothorax, Deep Vein Thrombosis, Type Weeks Of Treatment: 1 History: II Diabetes, Neuropathy Clustered Wound: No Photos Photo Uploaded By: Benjaman Kindler on 09/21/2019 16:42:00 Wound Measurements Length: (cm) 1 % Reduction Width: (cm) 1 % Reduction Depth: (cm) 0.1 Epitheliali Area: (cm) 0.785 Tunneling: Volume: (cm) 0.079 Underminin Wound Description Classification: Grade 2 Wound Margin: Distinct, outline attached Exudate Amount: Medium Exudate Type: Serosanguineous Exudate Color: red, brown Wound Bed Granulation Amount: Medium (34-66%) Granulation Quality:  Pink, Pale Necrotic Amount: Medium (34-66%) Foul Odor After Cleansing: No Slough/Fibrino Yes Exposed Structure Fascia Exposed: No Fat Layer (Subcutaneous Tissue) Exposed: Yes Tendon Exposed: No Muscle Exposed: No Joint Exposed: No Bone Exposed: No in Area: 70.6% in Volume: 70.4% zation: None No g: No Electronic Signature(s) Signed: 09/17/2019 5:06:29 PM By: Cherylin Mylar Signed: 09/21/2019 5:28:43 PM By: Zandra Abts RN, BSN Entered By: Cherylin Mylar on 09/17/2019 08:32:49 -------------------------------------------------------------------------------- Vitals Details Patient Name: Date of Service: Allen Gregory, Allen Gregory 09/17/2019 8:00 AM Medical Record [id number] Patient Account Number: [id number] Date of Birth/Sex: Treating RN: 1961/03/17 (59 y.o. [name] Primary Care Siara Gorder: PATIENT, NO Other Clinician: Referring Lesly Pontarelli: Treating Dane Kopke/Extender:Robson, [name] in Treatment: 10 Vital Signs Time Taken: 08:11 Temperature (F): 98.0 Height (in): 78 Pulse (bpm): 72 Weight (lbs): 285 Respiratory Rate (breaths/min): 18 Body Mass Index (BMI): 32.9 Blood Pressure (mmHg): 142/79 Reference Range: 80 - 120 mg / dl Electronic Signature(s) Signed: 10/03/2019 9:21:08 AM By: Karl Ito Entered By: Karl Ito on 09/17/2019 08:11:48

## 2019-10-03 NOTE — Progress Notes (Signed)
Allen Gregory, Allen Gregory ([id number]) Visit Report for 09/03/2019 Arrival Information Details Patient Name: Date of Service: Allen Gregory, Allen Gregory 09/03/2019 8:00 AM Medical Record [id number] Patient Account Number: [id number] Date of Birth/Sex: Treating RN: [birth year] (59 y.o. Allen Gregory Primary Care Allen Gregory: PATIENT, NO Other Clinician: Referring Allen Gregory: Treating Allen Gregory/Extender:Allen Gregory in Treatment: 8 Visit Information History Since Last Visit Added or deleted any medications: No Patient Arrived: Ambulatory Any new allergies or adverse reactions: No Arrival Time: 07:54 Had a fall or experienced change in No Accompanied By: self activities of daily living that may affect Transfer Assistance: None risk of falls: Patient Identification Verified: Yes Signs or symptoms of abuse/neglect since last No Secondary Verification Process Yes visito Completed: Hospitalized since last visit: No Patient Requires Transmission- No Implantable device outside of the clinic excluding No Based Precautions: cellular tissue based products placed in the center Patient Has Alerts: Yes since last visit: Patient Alerts: Patient on Blood Has Dressing in Place as Prescribed: Yes Thinner Pain Present Now: No Electronic Signature(s) Signed: 10/03/2019 9:21:08 AM By: Allen Gregory Entered By: Allen Gregory on 09/03/2019 07:55:10 -------------------------------------------------------------------------------- Clinic Level of Care Assessment Details Patient Name: Date of Service: Allen Gregory 09/03/2019 8:00 AM Medical Record [id number] Patient Account Number: [id number] Date of Birth/Sex: Treating RN: 25-Jun-1960 (59 y.o. Allen Gregory Primary Care Allen Gregory: PATIENT, NO Other Clinician: Referring Allen Gregory: Treating Allen Gregory/Extender:Allen Gregory in Treatment: 8 Clinic Level of Care Assessment Items TOOL 4 Quantity Score X - Use when only an EandM is performed  on FOLLOW-UP visit 1 0 ASSESSMENTS - Nursing Assessment / Reassessment X - Reassessment of Co-morbidities (includes updates in patient status) 1 10 X - Reassessment of Adherence to Treatment Plan 1 5 ASSESSMENTS - Wound and Skin Assessment / Reassessment X - Simple Wound Assessment / Reassessment - one wound 1 5 []  - Complex Wound Assessment / Reassessment - multiple wounds 0 []  - Dermatologic / Skin Assessment (not related to wound area) 0 ASSESSMENTS - Focused Assessment []  - Circumferential Edema Measurements - multi extremities 0 []  - Nutritional Assessment / Counseling / Intervention 0 X - Lower Extremity Assessment (monofilament, tuning fork, pulses) 1 5 []  - Peripheral Arterial Disease Assessment (using hand held doppler) 0 ASSESSMENTS - Ostomy and/or Continence Assessment and Care []  - Incontinence Assessment and Management 0 []  - Ostomy Care Assessment and Management (repouching, etc.) 0 PROCESS - Coordination of Care X - Simple Patient / Family Education for ongoing care 1 15 []  - Complex (extensive) Patient / Family Education for ongoing care 0 X - Staff obtains , Records, Test Results / Process Orders 1 10 []  - Staff telephones HHA, Nursing Homes / Clarify orders / etc 0 []  - Routine Transfer to another Facility (non-emergent condition) 0 []  - Routine Hospital Admission (non-emergent condition) 0 []  - New Admissions / / Ordering NPWT, Apligraf, etc. 0 []  - Emergency Hospital Admission (emergent condition) 0 X - Simple Discharge Coordination 1 10 []  - Complex (extensive) Discharge Coordination 0 PROCESS - Special Needs []  - Pediatric / Minor Patient Management 0 []  - Isolation Patient Management 0 []  - Hearing / Language / Visual special needs 0 []  - Assessment of Community assistance (transportation, D/C planning, etc.) 0 []  - Additional assistance / Altered mentation 0 []  - Support Surface(s) Assessment (bed, cushion, seat, etc.)  0 INTERVENTIONS - Wound Cleansing / Measurement X - Simple Wound Cleansing - one wound 1 5 []  - Complex Wound Cleansing - multiple wounds 0 X - Wound Imaging (photographs -  any number of wounds) 1 5 []  - Wound Tracing (instead of photographs) 0 X - Simple Wound Measurement - one wound 1 5 []  - Complex Wound Measurement - multiple wounds 0 INTERVENTIONS - Wound Dressings X - Small Wound Dressing one or multiple wounds 1 10 []  - Medium Wound Dressing one or multiple wounds 0 []  - Large Wound Dressing one or multiple wounds 0 X - Application of Medications - topical 1 5 []  - Application of Medications - injection 0 INTERVENTIONS - Miscellaneous []  - External ear exam 0 []  - Specimen Collection (cultures, biopsies, blood, body fluids, etc.) 0 []  - Specimen(s) / Culture(s) sent or taken to Lab for analysis 0 []  - Patient Transfer (multiple staff / / Similar devices) 0 []  - Simple Staple / Suture removal (25 or less) 0 []  - Complex Staple / Suture removal (26 or more) 0 []  - Hypo / Hyperglycemic Management (close monitor of Blood Glucose) 0 []  - Ankle / Brachial Index (ABI) - do not check if billed separately 0 X - Vital Signs 1 5 Has the patient been seen at the hospital within the last three years: Yes Total Score: 95 Level Of Care: New/Established - Level 3 Electronic Signature(s) Signed: 09/03/2019 5:44:18 PM By: RN, BSN Entered By: on 09/03/2019 08:44:13 -------------------------------------------------------------------------------- Encounter Discharge Information Details Patient Name: Date of Service: Allen Gregory, Allen Gregory 09/03/2019 8:00 AM Medical Record Patient Account Number: Date of Birth/Sex: Treating RN: [birth year] (59 y.o. Primary Care Allen Gregory: PATIENT, NO Other Clinician: Referring Allen Gregory: Treating Allen Gregory/Extender:Robson, in Treatment: 8 Encounter Discharge Information  Items Discharge Condition: Stable Ambulatory Status: Ambulatory Discharge Destination: Home Transportation: Private Auto Accompanied By: self Schedule Follow-up Appointment: Yes Clinical Summary of Care: Patient Declined Electronic Signature(s) Signed: 09/03/2019 5:25:45 PM By: 09/05/2019 Entered By: Zandra Abts on 09/03/2019 08:31:09 -------------------------------------------------------------------------------- Lower Extremity Assessment Details Patient Name: Date of Service: Allen Gregory, Allen Gregory 09/03/2019 8:00 AM Medical Record 09/05/2019 Patient Account Number: [id number] Date of Birth/Sex: Treating RN: 01-Mar-1961 (59 y.o. 58 Primary Care Mayo Owczarzak: PATIENT, NO Other Clinician: Referring Waylan Busta: Treating Latrel Szymczak/Extender:Robson, [name] in Treatment: 8 Edema Assessment Assessed: [Left: No] [Right: Yes] Edema: [Left: N] [Right: o] Calf Left: Right: Point of Measurement: 38 cm From Medial Instep cm 43.5 cm Ankle Left: Right: Point of Measurement: 10 cm From Medial Instep cm 23.5 cm Vascular Assessment Pulses: Dorsalis Pedis Palpable: [Right:Yes] Electronic Signature(s) Signed: 09/03/2019 5:25:09 PM By: 09/05/2019 Entered By: Cherylin Mylar on 09/03/2019 08:03:34 -------------------------------------------------------------------------------- Multi Wound Chart Details Patient Name: Date of Service: Allen Gregory, Allen Gregory 09/03/2019 8:00 AM Medical Record 09/05/2019 Patient Account Number: [id number] Date of Birth/Sex: Treating RN: 22-Sep-1960 (59 y.o. 58 Primary Care Kandance Yano: PATIENT, NO Other Clinician: Referring Shanetra Blumenstock: Treating Sacha Radloff/Extender:Robson, [name] in Treatment: 8 Vital Signs Height(in): 78 Pulse(bpm): 78 Weight(lbs): 285 Blood Pressure(mmHg): 131/61 Body Mass Index(BMI): 33 Temperature(F): 98.4 Respiratory 18 Rate(breaths/min): Photos: [1:No Photos] [N/A:N/A] Wound Location: [1:Right Toe  Great] [N/A:N/A] Wounding Event: [1:Gradually Appeared] [N/A:N/A] Primary Etiology: [1:Diabetic Wound/Ulcer of the N/A Lower Extremity] Comorbid History: [1:Pneumothorax, Deep Vein N/A Thrombosis, Type II Diabetes, Neuropathy] Date Acquired: [1:01/20/2019] [N/A:N/A] Weeks of Treatment: [1:8] [N/A:N/A] Wound Status: [1:Open] [N/A:N/A] Measurements L x W x D 0.5x0.6x0.4 [N/A:N/A] (cm) Area (cm) : [1:0.236] [N/A:N/A] Volume (cm) : [1:0.094] [N/A:N/A] % Reduction in Area: [1:49.90%] [N/A:N/A] % Reduction in Volume: 60.20% [N/A:N/A] Classification: [1:Grade 2] [N/A:N/A] Exudate Amount: [1:Small] [N/A:N/A] Exudate Type: [1:Serosanguineous] [N/A:N/A] Exudate Color: [1:red, brown] [  N/A:N/A] Wound Margin: [1:Thickened] [N/A:N/A] Granulation Amount: [1:Large (67-100%)] [N/A:N/A] Granulation Quality: [1:Red] [N/A:N/A] Necrotic Amount: [1:None Present (0%)] [N/A:N/A] Exposed Structures: [1:Fat Layer (Subcutaneous N/A Tissue) Exposed: Yes Fascia: No Tendon: No Muscle: No Joint: No Bone: No Small (1-33%)] [N/A:N/A] Treatment Notes Electronic Signature(s) Signed: 09/03/2019 5:44:18 PM By: Levan Hurst RN, BSN Signed: 09/04/2019 10:32:20 AM By: Linton Ham MD Entered By: Linton Ham on 09/03/2019 08:22:47 -------------------------------------------------------------------------------- Multi-Disciplinary Care Plan Details Patient Name: Date of Service: Allen Gregory, Allen Gregory 09/03/2019 8:00 AM Medical Record [id number] Patient Account Number: [id number] Date of Birth/Sex: Treating RN: [birth year] (59 y.o. [name] Primary Care Sheila Gervasi: PATIENT, NO Other Clinician: Referring Vedansh Kerstetter: Treating Edith Groleau/Extender:Robson, Esperanza Richters in Treatment: 8 Active Inactive Wound/Skin Impairment Nursing Diagnoses: Impaired tissue integrity Knowledge deficit related to ulceration/compromised skin integrity Goals: Patient/caregiver will verbalize understanding of skin care regimen Date  Initiated: 07/09/2019 Target Resolution Date: 10/05/2019 Goal Status: Active Interventions: Assess patient/caregiver ability to obtain necessary supplies Assess patient/caregiver ability to perform ulcer/skin care regimen upon admission and as needed Assess ulceration(s) every visit Provide education on ulcer and skin care Notes: Electronic Signature(s) Signed: 09/03/2019 5:44:18 PM By: Levan Hurst RN, BSN Entered By: Levan Hurst on 09/03/2019 08:14:21 -------------------------------------------------------------------------------- Pain Assessment Details Patient Name: Date of Service: Allen Gregory, Allen Gregory 09/03/2019 8:00 AM Medical Record [id number] Patient Account Number: [id number] Date of Birth/Sex: Treating RN: 1961/05/02 (58 y.o. [name] Primary Care Harding Thomure: PATIENT, NO Other Clinician: Referring Sadiyah Kangas: Treating Reynalda Canny/Extender:Robson, Esperanza Richters in Treatment: 8 Active Problems Location of Pain Severity and Description of Pain Patient Has Paino No Site Locations Pain Management and Medication Current Pain Management: Electronic Signature(s) Signed: 09/03/2019 5:44:18 PM By: Levan Hurst RN, BSN Signed: 10/03/2019 9:21:08 AM By: Sandre Kitty Entered By: Sandre Kitty on 09/03/2019 07:57:09 -------------------------------------------------------------------------------- Patient/Caregiver Education Details Patient Name: Date of Service: Allen Gregory, Allen Gregory 3/15/2021andnbsp8:00 AM Medical Record [id number] Patient Account Number: [id number] Date of Birth/Gender: 1960/12/10 (60 y.o. M) Treating RN: Levan Hurst Primary Care Physician: PATIENT, NO Other Clinician: Referring Physician: Treating Physician/Extender:Robson, Esperanza Richters in Treatment: 8 Education Assessment Education Provided To: Patient Education Topics Provided Wound/Skin Impairment: Methods: Explain/Verbal Responses: State content correctly Electronic  Signature(s) Signed: 09/03/2019 5:44:18 PM By: Levan Hurst RN, BSN Entered By: Levan Hurst on 09/03/2019 08:14:31 -------------------------------------------------------------------------------- Wound Assessment Details Patient Name: Date of Service: Allen Gregory, Allen Gregory 09/03/2019 8:00 AM Medical Record [id number] Patient Account Number: [id number] Date of Birth/Sex: Treating RN: Sep 03, 1960 (59 y.o. [name] Primary Care Vicktoria Muckey: PATIENT, NO Other Clinician: Referring Anquinette Pierro: Treating Allannah Kempen/Extender:Robson, Esperanza Richters in Treatment: 8 Wound Status Wound Number: 1 Primary Diabetic Wound/Ulcer of the Lower Etiology: Extremity Wound Location: Right Toe Great Wound Open Wounding Event: Gradually Appeared Status: Date Acquired: 01/20/2019 Comorbid Pneumothorax, Deep Vein Thrombosis, Type Weeks Of Treatment: 8 History: II Diabetes, Neuropathy Clustered Wound: No Photos Wound Measurements Length: (cm) 0.5 Width: (cm) 0.6 Depth: (cm) 0.4 Area: (cm) 0.236 Volume: (cm) 0.094 Wound Description Classification: Grade 2 Wound Margin: Thickened Exudate Amount: Small Exudate Type: Serosanguineous Exudate Color: red, brown Wound Bed Granulation Amount: Large (67-100%) Granulation Quality: Red Necrotic Amount: None Present (0%) r After Cleansing: No ibrino No Exposed Structure osed: No (Subcutaneous Tissue) Exposed: Yes osed: No osed: No sed: No ed: No % Reduction in Area: 49.9% % Reduction in Volume: 60.2% Epithelialization: Small (1-33%) Tunneling: No Undermining: No Foul Odo Slough/F Fascia Exp Fat Layer Tendon Exp Muscle Exp Joint Expo Bone Expos Electronic Signature(s) Signed: 09/04/2019 4:45:30 PM By: Mikeal Hawthorne EMT/HBOT Signed: 10/03/2019 9:03:51 AM By: Levan Hurst RN,  BSN Previous Signature: 09/03/2019 5:25:09 PM Version By: Shawn Stall Previous Signature: 09/03/2019 5:44:18 PM Version By: Zandra Abts RN, BSN Entered By: Benjaman Kindler on 09/04/2019 13:34:44 -------------------------------------------------------------------------------- Vitals Details Patient Name: Date of Service: Allen Gregory, Allen Gregory 09/03/2019 8:00 AM Medical Record [id number] Patient Account Number: [id number] Date of Birth/Sex: Treating RN: February 14, 1961 (59 y.o. Allen Gregory Primary Care Hussien Greenblatt: PATIENT, NO Other Clinician: Referring Lilla Callejo: Treating Kalen Ratajczak/Extender:Allen Gregory in Treatment: 8 Vital Signs Time Taken: 07:56 Temperature (F): 98.4 Height (in): 78 Pulse (bpm): 78 Weight (lbs): 285 Respiratory Rate (breaths/min): 18 Body Mass Index (BMI): 32.9 Blood Pressure (mmHg): 131/61 Reference Range: 80 - 120 mg / dl Electronic Signature(s) Signed: 10/03/2019 9:21:08 AM By: Allen Gregory Entered By: Allen Gregory on 09/03/2019 07:57:02

## 2019-10-03 NOTE — Progress Notes (Signed)
LIVAN, HIRES ([id number]) Visit Report for 07/31/2019 Arrival Information Details Patient Name: Date of Service: FLYNT, BREEZE 07/31/2019 9:00 AM Medical Record [id number] Patient Account Number: [id number] Date of Birth/Sex: Treating RN: 07-21-1960 (59 y.o. [name]) Epps, Lyla Son Primary Care Sejla Marzano: PATIENT, NO Other Clinician: Referring Shasha Buchbinder: Treating Calub Tarnow/Extender:Robson, [name] in Treatment: 3 Visit Information History Since Last Visit Added or deleted any medications: No Patient Arrived: Ambulatory Any new allergies or adverse reactions: No Arrival Time: 09:04 Had a fall or experienced change in No Accompanied By: self activities of daily living that may affect Transfer Assistance: None risk of falls: Patient Identification Verified: Yes Signs or symptoms of abuse/neglect since last No Secondary Verification Process Yes visito Completed: Hospitalized since last visit: No Patient Requires Transmission- No Implantable device outside of the clinic excluding No Based Precautions: cellular tissue based products placed in the center Patient Has Alerts: Yes since last visit: Patient Alerts: Patient on Blood Has Dressing in Place as Prescribed: Yes Thinner Pain Present Now: No Electronic Signature(s) Signed: 10/03/2019 9:23:26 AM By: Karl Ito Entered By: Karl Ito on 07/31/2019 09:05:28 -------------------------------------------------------------------------------- Encounter Discharge Information Details Patient Name: Date of Service: KODEY, XUE 07/31/2019 9:00 AM Medical Record [id number] Patient Account Number: [id number] Date of Birth/Sex: Treating RN: 1961/03/03 (59 y.o. [name] Primary Care Rosaleah Person: PATIENT, NO Other Clinician: Referring Lorilyn Laitinen: Treating Karessa Onorato/Extender:Robson, [name] in Treatment: 3 Encounter Discharge Information Items Discharge Condition: Stable Ambulatory Status:  Ambulatory Discharge Destination: Home Transportation: Private Auto Accompanied By: alone Schedule Follow-up Appointment: Yes Clinical Summary of Care: Patient Declined Electronic Signature(s) Signed: 07/31/2019 5:30:42 PM By: Zandra Abts RN, BSN Entered By: Zandra Abts on 07/31/2019 10:27:56 -------------------------------------------------------------------------------- Lower Extremity Assessment Details Patient Name: Date of Service: TYMAR, POLYAK 07/31/2019 9:00 AM Medical Record [id number] Patient Account Number: [id number] Date of Birth/Sex: Treating RN: 1960-09-29 (59 y.o. [name] Primary Care [name]: PATIENT, NO Other Clinician: Referring Alenna Russell: Treating Andreana Klingerman/Extender:Robson, [name] in Treatment: 3 Edema Assessment Assessed: [Left: No] [Right: No] Edema: [Left: N] [Right: o] Calf Left: Right: Point of Measurement: 38 cm From Medial Instep cm 44 cm Ankle Left: Right: Point of Measurement: 10 cm From Medial Instep cm 24 cm Vascular Assessment Pulses: Dorsalis Pedis Palpable: [Right:Yes] Electronic Signature(s) Signed: 07/31/2019 5:30:42 PM By: Zandra Abts RN, BSN Entered By: Zandra Abts on 07/31/2019 09:15:54 -------------------------------------------------------------------------------- Multi Wound Chart Details Patient Name: Date of Service: Kizzie Furnish 07/31/2019 9:00 AM Medical Record [id number] Patient Account Number: [id number] Date of Birth/Sex: Treating RN: February 28, 1961 (58 y.o. [name]) Yevonne Pax Primary Care Mikaila Grunert: PATIENT, NO Other Clinician: Referring Isaak Delmundo: Treating Zanasia Hickson/Extender:Robson, [name] in Treatment: 3 Vital Signs Height(in): 78 Pulse(bpm): 70 Weight(lbs): 285 Blood Pressure(mmHg): 140/78 Body Mass Index(BMI): 33 Temperature(F): 98.6 Respiratory 18 Rate(breaths/min): Photos: [1:No Photos] [N/A:N/A] Wound Location: [1:Right Toe Great] [N/A:N/A] Wounding Event: [1:Gradually  Appeared] [N/A:N/A] Primary Etiology: [1:Diabetic Wound/Ulcer of the N/A Lower Extremity] Comorbid History: [1:Pneumothorax, Deep Vein N/A Thrombosis, Type II Diabetes, Neuropathy] Date Acquired: [1:01/20/2019] [N/A:N/A] Weeks of Treatment: [1:3] [N/A:N/A] Wound Status: [1:Open] [N/A:N/A] Measurements L x W x D 1.2x0.8x0.4 [N/A:N/A] (cm) Area (cm) : [1:0.754] [N/A:N/A] Volume (cm) : [1:0.302] [N/A:N/A] % Reduction in Area: [1:-60.10%] [N/A:N/A] % Reduction in Volume: -28.00% [N/A:N/A] Classification: [1:Grade 1] [N/A:N/A] Exudate Amount: [1:Small] [N/A:N/A] Exudate Type: [1:Serosanguineous] [N/A:N/A] Exudate Color: [1:red, brown] [N/A:N/A] Wound Margin: [1:Thickened] [N/A:N/A] Granulation Amount: [1:Large (67-100%)] [N/A:N/A] Granulation Quality: [1:Pink] [N/A:N/A] Necrotic Amount: [1:Small (1-33%)] [N/A:N/A] Exposed Structures: [1:Fat Layer (Subcutaneous N/A Tissue) Exposed: Yes Fascia: No Tendon: No Muscle: No Joint: No  Bone: No None] [N/A:N/A] Treatment Notes Electronic Signature(s) Signed: 07/31/2019 5:08:08 PM By: Baltazar Najjar MD Signed: 07/31/2019 5:19:34 PM By: Yevonne Pax RN Entered By: Baltazar Najjar on 07/31/2019 10:11:31 -------------------------------------------------------------------------------- Multi-Disciplinary Care Plan Details Patient Name: Date of Service: ADEKUNLE, ROHRBACH 07/31/2019 9:00 AM Medical Record [id number] Patient Account Number: [id number] Date of Birth/Sex: Treating RN: [birth year] (58 y.o. [name]) Yevonne Pax Primary Care Syrina Wake: PATIENT, NO Other Clinician: Referring Alwin Lanigan: Treating Alexey Rhoads/Extender:Robson, [name] in Treatment: 3 Active Inactive Nutrition Nursing Diagnoses: Impaired glucose control: actual or potential Potential for alteratiion in Nutrition/Potential for imbalanced nutrition Goals: Patient/caregiver agrees to and verbalizes understanding of need to use nutritional supplements and/or vitamins  as prescribed Date Initiated: 07/09/2019 Target Resolution Date: 08/10/2019 Goal Status: Active Patient/caregiver will maintain therapeutic glucose control Date Initiated: 07/09/2019 Target Resolution Date: 08/10/2019 Goal Status: Active Interventions: Assess HgA1c results as ordered upon admission and as needed Assess patient nutrition upon admission and as needed per policy Provide education on elevated blood sugars and impact on wound healing Provide education on nutrition Treatment Activities: Education provided on Nutrition : 07/09/2019 Notes: Wound/Skin Impairment Nursing Diagnoses: Impaired tissue integrity Knowledge deficit related to ulceration/compromised skin integrity Goals: Patient/caregiver will verbalize understanding of skin care regimen Date Initiated: 07/09/2019 Target Resolution Date: 08/10/2019 Goal Status: Active Interventions: Assess patient/caregiver ability to obtain necessary supplies Assess patient/caregiver ability to perform ulcer/skin care regimen upon admission and as needed Assess ulceration(s) every visit Provide education on ulcer and skin care Notes: Electronic Signature(s) Signed: 07/31/2019 5:19:34 PM By: Yevonne Pax RN Entered By: Yevonne Pax on 07/31/2019 08:52:03 -------------------------------------------------------------------------------- Pain Assessment Details Patient Name: Date of Service: JAMA, KRICHBAUM 07/31/2019 9:00 AM Medical Record [id number] Patient Account Number: [id number] Date of Birth/Sex: Treating RN: [birth year] (58 y.o. [name]) Yevonne Pax Primary Care Lakevia Perris: PATIENT, NO Other Clinician: Referring Karolee Meloni: Treating Rawad Bochicchio/Extender:Robson, [name] in Treatment: 3 Active Problems Location of Pain Severity and Description of Pain Patient Has Paino No Site Locations Pain Management and Medication Current Pain Management: Electronic Signature(s) Signed: 07/31/2019 5:19:34 PM By: Yevonne Pax RN Signed:  10/03/2019 9:23:26 AM By: Karl Ito Entered By: Karl Ito on 07/31/2019 09:05:52 -------------------------------------------------------------------------------- Patient/Caregiver Education Details Patient Name: Date of Service: NASEEM, VARDEN 2/9/2021andnbsp9:00 AM Medical Record [id number] Patient Account Number: [id number] Date of Birth/Gender: Treating RN: [birth year] (58 y.o. [name]) Yevonne Pax Primary Care Physician: PATIENT, NO Other Clinician: Referring Physician: Treating Physician/Extender:Robson, [name] in Treatment: 3 Education Assessment Education Provided To: Patient Education Topics Provided Nutrition: Methods: Explain/Verbal Responses: State content correctly Electronic Signature(s) Signed: 07/31/2019 5:19:34 PM By: Yevonne Pax RN Entered By: Yevonne Pax on 07/31/2019 08:52:20 -------------------------------------------------------------------------------- Wound Assessment Details Patient Name: Date of Service: NICKALUS, THORNSBERRY 07/31/2019 9:00 AM Medical Record [id number] Patient Account Number: [id number] Date of Birth/Sex: Treating RN: August 11, 1960 (58 y.o. [name]) Yevonne Pax Primary Care Samar Dass: PATIENT, NO Other Clinician: Referring Shiquita Collignon: Treating Zyan Mirkin/Extender:Robson, [name] in Treatment: 3 Wound Status Wound Number: 1 Primary Diabetic Wound/Ulcer of the Lower Etiology: Extremity Wound Location: Right Toe Great Wound Open Wounding Event: Gradually Appeared Status: Date Acquired: 01/20/2019 Comorbid Pneumothorax, Deep Vein Thrombosis, Type Weeks Of Treatment: 3 History: II Diabetes, Neuropathy Clustered Wound: No Photos Wound Measurements Length: (cm) 1.2 Width: (cm) 0.8 Depth: (cm) 0.4 Area: (cm) 0.754 Volume: (cm) 0.302 Wound Description Classification: Grade 1 Wound Margin: Thickened Exudate Amount: Small Exudate Type: Serosanguineous Exudate Color: red, brown Wound Bed Granulation Amount: Large  (67-100%) Granulation Quality: Pink Necrotic Amount: Small (1-33%) Necrotic Quality: Adherent Slough After Cleansing: No brino No Exposed  Structure osed: No (Subcutaneous Tissue) Exposed: Yes osed: No osed: No sed: No ed: No % Reduction in Area: -60.1% % Reduction in Volume: -28% Epithelialization: None Tunneling: No Undermining: No Foul Odor Slough/Fi Fascia Exp Fat Layer Tendon Exp Muscle Exp Joint Expo Bone Expos Electronic Signature(s) Signed: 08/01/2019 4:31:42 PM By: Mikeal Hawthorne EMT/HBOT Signed: 08/01/2019 4:53:37 PM By: Carlene Coria RN Previous Signature: 07/31/2019 5:19:34 PM Version By: Carlene Coria RN Previous Signature: 07/31/2019 5:30:42 PM Version By: Levan Hurst RN, BSN Entered By: Mikeal Hawthorne on 08/01/2019 14:44:04 -------------------------------------------------------------------------------- [facility] [facility] Details Patient Name: Date of Service: ONIEL, MELESKI 07/31/2019 9:00 AM Medical Record [id number] Patient Account Number: [id number] Date of Birth/Sex: Treating RN: 1961-04-12 (58 y.o. [name]) Dolores Lory, [place] Primary Care Jeanni Allshouse: PATIENT, NO Other Clinician: Referring Deante Blough: Treating Ameirah Khatoon/Extender:Robson, Esperanza Richters in Treatment: 3 Vital Signs Time Taken: 09:05 Temperature (F): 98.6 Height (in): 78 Pulse (bpm): 70 Weight (lbs): 285 Respiratory Rate (breaths/min): 18 Body Mass Index (BMI): 32.9 Blood Pressure (mmHg): 140/78 Reference Range: 80 - 120 mg / dl Electronic Signature(s) Signed: 10/03/2019 9:23:26 AM By: Sandre Kitty Entered By: Sandre Kitty on 07/31/2019 09:05:44

## 2019-10-03 NOTE — Progress Notes (Signed)
ISSAM, CARLYON ([id number]) Visit Report for 08/27/2019 Arrival Information Details Patient Name: Date of Service: JORMA, TASSINARI 08/27/2019 8:15 AM Medical Record [id number] Patient Account Number: [id number] Date of Birth/Sex: Treating RN: 10/06/1960 (59 y.o. [name] Primary Care Jaedan Huttner: PATIENT, NO Other Clinician: Referring Star Cheese: Treating Buffi Ewton/Extender:Robson, Esperanza Richters in Treatment: 7 Visit Information History Since Last Visit Added or deleted any medications: No Patient Arrived: Ambulatory Any new allergies or adverse reactions: No Arrival Time: 08:00 Had a fall or experienced change in No Accompanied By: self activities of daily living that may affect Transfer Assistance: None risk of falls: Patient Identification Verified: Yes Signs or symptoms of abuse/neglect since last No Secondary Verification Process Yes visito Completed: Hospitalized since last visit: No Patient Requires Transmission- No Implantable device outside of the clinic excluding No Based Precautions: cellular tissue based products placed in the center Patient Has Alerts: Yes since last visit: Patient Alerts: Patient on Blood Has Dressing in Place as Prescribed: Yes Thinner Pain Present Now: No Electronic Signature(s) Signed: 10/03/2019 9:21:08 AM By: Sandre Kitty Entered By: Sandre Kitty on 08/27/2019 08:00:28 -------------------------------------------------------------------------------- Encounter Discharge Information Details Patient Name: Date of Service: KAELEM, BRACH 08/27/2019 8:15 AM Medical Record [id number] Patient Account Number: [id number] Date of Birth/Sex: Treating RN: 1960-07-20 (59 y.o. [name] Primary Care Harvin Konicek: PATIENT, NO Other Clinician: Referring Lovena Kluck: Treating Barney Russomanno/Extender:Robson, Esperanza Richters in Treatment: 7 Encounter Discharge Information Items Post Procedure Vitals Discharge Condition: Stable Temperature (F):  98.1 Ambulatory Status: Ambulatory Pulse (bpm): 71 Discharge Destination: Home Respiratory Rate (breaths/min): 18 Transportation: Private Auto Blood Pressure (mmHg): 130/66 Accompanied By: self Schedule Follow-up Appointment: Yes Clinical Summary of Care: Electronic Signature(s) Signed: 08/27/2019 1:02:56 PM By: Deon Pilling Entered By: Deon Pilling on 08/27/2019 09:14:30 -------------------------------------------------------------------------------- Lower Extremity Assessment Details Patient Name: Date of Service: JAHVON, GOSLINE 08/27/2019 8:15 AM Medical Record [id number] Patient Account Number: [id number] Date of Birth/Sex: Treating RN: Aug 26, 1960 (59 y.o. [name] Primary Care Arwen Haseley: PATIENT, NO Other Clinician: Referring Eren Ryser: Treating Jadynn Epping/Extender:Robson, Esperanza Richters in Treatment: 7 Edema Assessment Assessed: [Left: No] [Right: Yes] Edema: [Left: N] [Right: o] Calf Left: Right: Point of Measurement: 38 cm From Medial Instep cm 44 cm Ankle Left: Right: Point of Measurement: 10 cm From Medial Instep cm 24 cm Vascular Assessment Pulses: Dorsalis Pedis Palpable: [Right:Yes] Electronic Signature(s) Signed: 08/27/2019 1:02:56 PM By: Deon Pilling Entered By: Deon Pilling on 08/27/2019 08:11:42 -------------------------------------------------------------------------------- Multi Wound Chart Details Patient Name: Date of Service: CORBITT, CLOKE 08/27/2019 8:15 AM Medical Record [id number] Patient Account Number: [id number] Date of Birth/Sex: Treating RN: 1961-03-23 (59 y.o. [name] Primary Care Mekenna Finau: PATIENT, NO Other Clinician: Referring Laurana Magistro: Treating Daesha Insco/Extender:Robson, Esperanza Richters in Treatment: 7 Vital Signs Height(in): 78 Pulse(bpm): 37 Weight(lbs): 15 Blood Pressure(mmHg): 130/66 Body Mass Index(BMI): 33 Temperature(F): 98.1 Respiratory 18 Rate(breaths/min): Photos: [1:No Photos] [N/A:N/A] Wound  Location: [1:Right Toe Great] [N/A:N/A] Wounding Event: [1:Gradually Appeared] [N/A:N/A] Primary Etiology: [1:Diabetic Wound/Ulcer of the N/A Lower Extremity] Comorbid History: [1:Pneumothorax, Deep Vein N/A Thrombosis, Type II Diabetes, Neuropathy] Date Acquired: [1:01/20/2019] [N/A:N/A] Weeks of Treatment: [1:7] [N/A:N/A] Wound Status: [1:Open] [N/A:N/A] Measurements L x W x D 1x0.8x0.5 [N/A:N/A] (cm) Area (cm) : [1:0.628] [N/A:N/A] Volume (cm) : [1:0.314] [N/A:N/A] % Reduction in Area: [1:-33.30%] [N/A:N/A] % Reduction in Volume: -33.10% [N/A:N/A] Classification: [1:Grade 2] [N/A:N/A] Exudate Amount: [1:Small] [N/A:N/A] Exudate Type: [1:Serosanguineous] [N/A:N/A] Exudate Color: [1:red, brown] [N/A:N/A] Wound Margin: [1:Thickened] [N/A:N/A] Granulation Amount: [1:Large (67-100%)] [N/A:N/A] Granulation Quality: [1:Red, Friable] [N/A:N/A] Necrotic Amount: [1:None Present (0%)] [N/A:N/A] Exposed Structures: [1:Fat Layer (  Subcutaneous N/A Tissue) Exposed: Yes Fascia: No Tendon: No Muscle: No Joint: No Bone: No] Epithelialization: [1:None] [N/A:N/A] Debridement: [1:Debridement - Excisional N/A] Pre-procedure [1:08:57] [N/A:N/A] Verification/Time Out Taken: Tissue Debrided: [1:Callus, Subcutaneous] [N/A:N/A] Level: [1:Skin/Subcutaneous Tissue N/A] Debridement Area (sq cm):0.8 [N/A:N/A] Instrument: [1:Curette] [N/A:N/A] Bleeding: [1:Minimum] [N/A:N/A] Hemostasis Achieved: [1:Pressure] [N/A:N/A] Procedural Pain: [1:0] [N/A:N/A] Post Procedural Pain: [1:0] [N/A:N/A] Debridement Treatment Procedure was tolerated [N/A:N/A] Response: [1:well] Post Debridement [1:1x0.8x0.5] [N/A:N/A] Measurements L x W x D (cm) Post Debridement [1:0.314] [N/A:N/A] Volume: (cm) Procedures Performed: [1:Debridement] [N/A:N/A] Treatment Notes Wound #1 (Right Toe Great) 1. Cleanse With Wound Cleanser 3. Primary Dressing Applied Collegen AG Hydrogel or K-Y Jelly 4. Secondary Dressing Dry  Gauze Roll Gauze 5. Secured With Medipore tape 7. Footwear/Offloading device applied Felt/Foam Wedge shoe Electronic Signature(s) Signed: 08/27/2019 5:29:15 PM By: Baltazar Najjar MD Signed: 08/27/2019 5:59:01 PM By: Zandra Abts RN, BSN Entered By: Baltazar Najjar on 08/27/2019 09:18:49 -------------------------------------------------------------------------------- Multi-Disciplinary Care Plan Details Patient Name: Date of Service: CAIDE, CAMPI 08/27/2019 8:15 AM Medical Record [id number] Patient Account Number: [id number] Date of Birth/Sex: Treating RN: 03-08-1961 (59 y.o. [name] Primary Care Saadiq Poche: PATIENT, NO Other Clinician: Referring Glenard Keesling: Treating Dolores Mcgovern/Extender:Robson, [name] in Treatment: 7 Active Inactive Wound/Skin Impairment Nursing Diagnoses: Impaired tissue integrity Knowledge deficit related to ulceration/compromised skin integrity Goals: Patient/caregiver will verbalize understanding of skin care regimen Date Initiated: 07/09/2019 Target Resolution Date: 09/07/2019 Goal Status: Active Interventions: Assess patient/caregiver ability to obtain necessary supplies Assess patient/caregiver ability to perform ulcer/skin care regimen upon admission and as needed Assess ulceration(s) every visit Provide education on ulcer and skin care Notes: Electronic Signature(s) Signed: 08/27/2019 5:59:01 PM By: Zandra Abts RN, BSN Entered By: Zandra Abts on 08/27/2019 07:59:27 -------------------------------------------------------------------------------- Pain Assessment Details Patient Name: Date of Service: MEDARDO, HASSING 08/27/2019 8:15 AM Medical Record [id number] Patient Account Number: [id number] Date of Birth/Sex: Treating RN: 1961/05/01 (59 y.o. [name] Primary Care Liv Rallis: PATIENT, NO Other Clinician: Referring Olyver Hawes: Treating Matteo Banke/Extender:Robson, [name] in Treatment: 7 Active Problems Location  of Pain Severity and Description of Pain Patient Has Paino No Site Locations Pain Management and Medication Current Pain Management: Electronic Signature(s) Signed: 08/27/2019 5:59:01 PM By: Zandra Abts RN, BSN Signed: 10/03/2019 9:21:08 AM By: Karl Ito Entered By: Karl Ito on 08/27/2019 08:01:59 -------------------------------------------------------------------------------- Patient/Caregiver Education Details Patient Name: Date of Service: CLANCE, BAQUERO 3/8/2021andnbsp8:15 AM Medical Record [id number] Patient Account Number: [id number] Date of Birth/Gender: Treating RN: 08/17/1960 (59 y.o. [name] Primary Care Physician: PATIENT, NO Other Clinician: Referring Physician: Treating Physician/Extender:Robson, [name] in Treatment: 7 Education Assessment Education Provided To: Patient Education Topics Provided Wound/Skin Impairment: Methods: Explain/Verbal Responses: State content correctly Electronic Signature(s) Signed: 08/27/2019 5:59:01 PM By: Zandra Abts RN, BSN Entered By: Zandra Abts on 08/27/2019 07:59:36 -------------------------------------------------------------------------------- Wound Assessment Details Patient Name: Date of Service: LEAH, SKORA 08/27/2019 8:15 AM Medical Record [id number] Patient Account Number: [id number] Date of Birth/Sex: Treating RN: 1960-08-27 (59 y.o. [name] Primary Care Harmoni Lucus: PATIENT, NO Other Clinician: Referring Yukio Bisping: Treating Niylah Hassan/Extender:Robson, [name] in Treatment: 7 Wound Status Wound Number: 1 Primary Diabetic Wound/Ulcer of the Lower Etiology: Extremity Wound Location: Right Toe Great Wound Open Wounding Event: Gradually Appeared Status: Date Acquired: 01/20/2019 Comorbid Pneumothorax, Deep Vein Thrombosis, Type Weeks Of Treatment: 7 History: II Diabetes, Neuropathy Clustered Wound: No Photos Wound Measurements Length: (cm) 1 Width: (cm)  0.8 Depth: (cm) 0.5 Area: (cm) 0.628 Volume: (cm) 0.314 Wound Description Classification: Grade 2 Wound Margin: Thickened Exudate Amount: Small Exudate Type: Serosanguineous Exudate  Color: red, brown Wound Bed Granulation Amount: Large (67-100%) Granulation Quality: Red, Friable Necrotic Amount: None Present (0%) After Cleansing: No brino No Exposed Structure osed: No (Subcutaneous Tissue) Exposed: Yes osed: No osed: No sed: No ed: No % Reduction in Area: -33.3% % Reduction in Volume: -33.1% Epithelialization: None Tunneling: No Undermining: No Foul Odor Slough/Fi Fascia Exp Fat Layer Tendon Exp Muscle Exp Joint Expo Bone Expos Electronic Signature(s) Signed: 08/28/2019 3:29:05 PM By: Benjaman Kindler EMT/HBOT Signed: 10/03/2019 9:04:28 AM By: Zandra Abts RN, BSN Previous Signature: 08/27/2019 1:02:56 PM Version By: Shawn Stall Previous Signature: 08/27/2019 5:59:01 PM Version By: Zandra Abts RN, BSN Entered By: Benjaman Kindler on 08/28/2019 11:05:09 -------------------------------------------------------------------------------- Vitals Details Patient Name: Date of Service: DIANE, MOCHIZUKI 08/27/2019 8:15 AM Medical Record [id number] Patient Account Number: [id number] Date of Birth/Sex: Treating RN: Feb 20, 1961 (59 y.o. [name] Primary Care Judd Mccubbin: PATIENT, NO Other Clinician: Referring Prateek Knipple: Treating Jakobe Blau/Extender:Robson, [name] in Treatment: 7 Vital Signs Time Taken: 08:00 Temperature (F): 98.1 Height (in): 78 Pulse (bpm): 71 Weight (lbs): 285 Respiratory Rate (breaths/min): 18 Body Mass Index (BMI): 32.9 Blood Pressure (mmHg): 130/66 Reference Range: 80 - 120 mg / dl Electronic Signature(s) Signed: 10/03/2019 9:21:08 AM By: Karl Ito Entered By: Karl Ito on 08/27/2019 08:01:54

## 2019-10-08 ENCOUNTER — Encounter (HOSPITAL_BASED_OUTPATIENT_CLINIC_OR_DEPARTMENT_OTHER): Payer: 59 | Admitting: Internal Medicine

## 2019-10-08 ENCOUNTER — Other Ambulatory Visit: Payer: Self-pay

## 2019-10-08 DIAGNOSIS — E11621 Type 2 diabetes mellitus with foot ulcer: Secondary | ICD-10-CM | POA: Diagnosis not present

## 2019-10-08 NOTE — Progress Notes (Signed)
Allen, Gregory ([id number]) Visit Report for 10/08/2019 Debridement Details Patient Name: Date of Service: Allen Gregory, Allen Gregory 10/08/2019 8:00 AM Medical Record [id number] Patient Account Number: [id number] Date of Birth/Sex: Treating RN: June 15, 1961 (59 y.o. [name] Primary Care Provider: PATIENT, NO Other Clinician: Referring Provider: Treating Provider/Extender:Denajah Farias, Esperanza Richters in Treatment: 13 Debridement Performed for Wound #1 Right,Plantar Toe Great Assessment: Performed By: Physician Ricard Dillon., MD Debridement Type: Debridement Severity of Tissue Pre Fat layer exposed Debridement: Level of Consciousness (Pre- Awake and Alert procedure): Pre-procedure Verification/Time Out Taken: Yes - 08:32 Start Time: 08:32 Total Area Debrided (L x W): 0.3 (cm) x 0.3 (cm) = 0.09 (cm) Tissue and other material Non-Viable, Callus debrided: Level: Non-Viable Tissue Debridement Description: Selective/Open Wound Instrument: Curette Bleeding: Minimum Hemostasis Achieved: Pressure End Time: 08:33 Procedural Pain: 0 Post Procedural Pain: 0 Response to Treatment: Procedure was tolerated well Level of Consciousness Awake and Alert (Post-procedure): Post Debridement Measurements of Total Wound Length: (cm) 0.3 Width: (cm) 0.3 Depth: (cm) 0.4 Volume: (cm) 0.028 Character of Wound/Ulcer Post Improved Debridement: Severity of Tissue Post Debridement: Fat layer exposed Post Procedure Diagnosis Same as Pre-procedure Electronic Signature(s) Signed: 10/08/2019 5:34:04 PM By: Linton Ham MD Signed: 10/08/2019 6:02:31 PM By: Levan Hurst RN, BSN Entered By: Linton Ham on 10/08/2019 08:55:41 -------------------------------------------------------------------------------- HPI Details Patient Name: Date of Service: Allen Gregory, Allen Gregory 10/08/2019 8:00 AM Medical Record [id number] Patient Account Number: [id number] Date of Birth/Sex: Treating RN: [birth year] (59  y.o. [name] Primary Care Provider: PATIENT, NO Other Clinician: Referring Provider: Treating Provider/Extender:Charlisha Market, Esperanza Richters in Treatment: 13 History of Present Illness HPI Description: ADMISSION 07/09/2019 This is a pleasant 59 year old man who referred himself here for second opinion sign a new area on the right plantar first toe and the dorsal part of his right fifth toe. He says he had these in August when he had removed callus from the first toe and then played pool volleyball for about 4 hours. He thinks the bottom of the pool simply caused an abrasion of the toe. The story sounds accurate. He has been going to podiatry for the last several months. Me a picture on his phone from August and the wound is gotten considerably smaller. He states that it started doing well recently when they started collagen. He has been using a surgical shoe to offload. 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 He had a brace for a long time but now he simply lifts his foot higher to clear but states that most people would not even notice that he had any disability. He is a type 2 diabetes Badik but he has diet-controlled he has lost weight by watching calories. 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 07/16/2019; right fifth toe is closed. Right first toe is smaller. He is using a surgical shoe but he tells me is fairly religious about using his scooter at home. Hopefully this will give him enough offloading to close this. 2/1; right fifth toe dorsally remains closed. Right first toe still not a lot of improvement. Although superficially it looks smaller there is undermining laterally from 6-6  of at least 3 to 4 mm. Also worrisome is that the granulation does not look  that healthy. We have been using silver collagen He had his foot x-rayed by his podiatrist but I do not have access to this. I am going to x-ray the right great toe again. Otherwise I am thinking he is going to require a total contact cast probably next week and I discussed this with him today. 2/9; his x-ray of the right foot did not show plain x-ray evidence of osteomyelitis. We have been using silver alginate the wound is not improved looks static. He is going to get a total contact cast today He comes in today telling me that before he left podiatry before he came here he had noninvasive arterial studies that were done by a mobile service and the podiatrist office. They are now trying to arrange I think an angiogram however this would be in [facility]. He asked my opinion on this and I told him that angiograms are done by several different doctor groups locally and if he is from [place] I cannot see a reason to go out of [facility] for any procedure he might need. We will see if we can get the actual angiogram report and I will refer him as needed to either vein and vascular or interventional cardiology 2/12; back for his first obligatory total contact cast change. I did get his arterial studies from in [facility]. Biphasic waveforms ABI in the right of 1.16 on the left at 1.14 I am not really sure what was so concerning about this [place] I will simply repeat these studies along with TBI's locally. There should not be any need for him to go out of town to have this evaluation. I am not sure that there is a clinical need 2/16; once again the total contact cast was too loose he has multiple skin excoriations where things were rubbing. He claims to not be on this all that much and is working from home but between the size of his foot the  wasting in the distal part of his lower leg and large calfs we just cannot mold the cast in to fit his leg properly.-Changed him to a forefoot off loader but with his foot drop that may not be possible. 2/23; we put him out in a surgical shoe last week as we did not have a forefoot off loader. We have this today. We are using silver alginate. He did not tolerate a total contact cast 3/1; he is in a forefoot offloading boot. Using silver collagen as of last week. No major change 3/8; he is using a forefoot offloading boot. Silver collagen over the last 2 weeks. Perhaps some reduction in depth. Moreover surface looks healthy 3/15; he is a forefoot offloading boot. Silver collagen over the last 3 weeks. There has been reduction in depth. Much less circumferential callus and thick subcutaneous tissue. 3/22; his original wound on the plantar right great toe is somewhat improved. However he arrives in clinic today with a completely denuded ulcer on the tip of the toe distal to the original wound. More concerning is there is loss of surface epithelium over a wide area around this even dorsally on the toe. He states that he last wrapped the area on Saturday. He was up at [facility] walking this weekend otherwise he had not done anything different. Noticeable for the fact that his forefoot offloading shoe was not in good condition fact that split open this could have had  something to do with this but I have no doubt that there is also some degree of infection/cellulitis 3/29; patient came to the clinic last week with a new wound on the tip of the right great toe. This looked infected there was epithelial loss extending to the base of the first toe dorsally on the right. I gave him empiric doxycycline. Culture I did showed methicillin sensitive staph aureus Proteus and group B strep. I gave him doxycycline which is not a reliable cover of strep or Proteus. I am going to give him a course of Augmentin today. He  has not been systemically unwell. 4/5; original wound on the plantar right great toe. 2 weeks ago had a new wound on the tip of the right great toe with extensive cellulitis. Culture I did at that time ultimately showed methicillin sensitive staph aureus Proteus and group B strep. He received some doxycycline I changed him to Augmentin last week he has 3 more days. He is complaining of some pruritus but no rash. X-ray; showed no bony abnormalities in the right great toe. 4/12; no evidence of infection. Right great toe now working on the original wound. The new wound at the tip of the toe is callused but no open area. 4/19; wound measures about the same. Roughly 3 mm of depth. He has a lot of callus on the tip of the toe where the subsequent wound was however there is no open wound here. Some callus around the circumference of the wound we have been working on [facility]) Signed: 10/08/2019 5:34:04 PM By: Baltazar Najjar MD Entered By: Baltazar Najjar on 10/08/2019 08:56:18 -------------------------------------------------------------------------------- Physical Exam Details Patient Name: Date of Service: Allen Gregory, Allen Gregory 10/08/2019 8:00 AM Medical Record [id number] Patient Account Number: [id number] Date of Birth/Sex: Treating RN: Nov 18, 1960 (59 y.o. [name] Primary Care Provider: PATIENT, NO Other Clinician: Referring Provider: Treating Provider/Extender:Tsugio Elison, [name] in Treatment: 13 Constitutional Patient is hypertensive.. Pulse regular and within target range for patient.Marland Kitchen Respirations regular, non-labored and within target range.. Temperature is normal and within the target range for the patient.Marland Kitchen Appears in no distress. Notes Wound exam; the areas on the tip of the toe has a lot of callus over there is no open wound here. The original wound we have been working on the plantar right great toe is small but still with 3 mm of depth. I removed some  callus from around the circumference with a #3 curette there was no bleeding. Base of the wound looks healthy Electronic Signature(s) Signed: 10/08/2019 5:34:04 PM By: Baltazar Najjar MD Entered By: Baltazar Najjar on 10/08/2019 08:57:11 -------------------------------------------------------------------------------- Physician Orders Details Patient Name: Date of Service: Allen Gregory, Allen Gregory 10/08/2019 8:00 AM Medical Record [id number] Patient Account Number: [id number] Date of Birth/Sex: Treating RN: [birth year] (59 y.o. 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 Dressing Change Frequency Wound #1 Right,Plantar Toe Great Change Dressing every other day. Wound Cleansing May shower and wash wound with soap and water. Primary Wound Dressing Wound #1 Right,Plantar Toe Great Calcium Alginate with Silver Secondary Dressing Wound #1 Right,Plantar Toe Great Foam - foam donut  Kerlix/Rolled Gauze - secure with tape Dry Gauze Off-Loading Wedge shoe to: - right foot Electronic Signature(s) Signed: 10/08/2019 5:34:04 PM By: Baltazar Najjarobson, Aslan Montagna MD Signed: 10/08/2019 6:02:31 PM By: Zandra AbtsLynch, Shatara RN, BSN Entered By: Zandra AbtsLynch, Shatara on 10/08/2019 08:20:00 -------------------------------------------------------------------------------- Problem List Details Patient Name: Date of Service: Allen Gregory, Allen Gregory 10/08/2019 8:00 AM Medical Record [id number] Patient Account Number: [id number] Date of  Birth/Sex: Treating RN: 05/07/1961 (59 y.o. Elizebeth KollerM) Lynch, Shatara Primary Care Provider: PATIENT, NO Other Clinician: Referring Provider: Treating Provider/Extender:Aroldo Galli, Lamar SprinklesMichael Weeks in Treatment: 13 Active Problems ICD-10 Evaluated Encounter Code Description Active Date Today Diagnosis E11.621 Type 2 diabetes mellitus with foot ulcer 07/09/2019 No Yes L97.512 Non-pressure chronic ulcer of other part of right foot 07/09/2019 No Yes with fat layer exposed E11.42 Type 2 diabetes mellitus with diabetic polyneuropathy 07/09/2019 No Yes M21.371 Foot drop, right foot 07/09/2019 No Yes L03.031 Cellulitis of right toe 09/10/2019 No Yes Inactive Problems ICD-10 Code Description Active Date Inactive Date E11.51 Type 2 diabetes mellitus with diabetic peripheral angiopathy 07/31/2019 07/31/2019 without gangrene Resolved Problems Electronic Signature(s) Signed: 10/08/2019 5:34:04 PM By: Baltazar Najjarobson, Parisha Beaulac MD Entered By: Baltazar Najjarobson, Jaecob Lowden on 10/08/2019 08:55:12 -------------------------------------------------------------------------------- Progress Note Details Patient Name: Date of Service: Allen Gregory, Allen Gregory 10/08/2019 8:00 AM Medical Record [id number] Patient Account Number: [id number] Date of Birth/Sex: Treating RN: 02/24/1961 (59 y.o. Elizebeth KollerM) Lynch, Shatara Primary Care Provider: PATIENT, NO Other Clinician: Referring Provider: Treating Provider/Extender:Eugine Bubb, Lamar SprinklesMichael Weeks in Treatment: 13 Subjective History of Present Illness (HPI) ADMISSION 07/09/2019 This is a pleasant 59 year old man who referred himself here for second opinion sign a new area on the right plantar first toe and the dorsal part of his right fifth toe. He says he had these in August when he had removed callus from the first toe and then played pool volleyball for about 4 hours. He thinks the bottom of the pool simply caused an abrasion of the toe. The story sounds accurate. He has been going to podiatry for the last several months.  Me a picture on his phone from August and the wound is gotten considerably smaller. He states that it started doing well recently when they started collagen. He has been using a surgical shoe to offload. 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 He had a brace for a long time but now he simply lifts his foot higher to clear but states that most people would not even notice that he had any disability. He is a type 2 diabetes Badik but he has diet-controlled he has lost weight by watching calories. 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 07/16/2019; right fifth toe is closed. Right first toe is smaller. He is using a surgical shoe but he tells me is fairly religious about using his scooter at home. Hopefully this will give him enough offloading to close this. 2/1; right fifth toe dorsally remains closed. Right first toe still not a lot of improvement. Although superficially it looks smaller there is undermining laterally from 6-6 of at least 3 to 4 mm. Also worrisome is that the granulation does not look that healthy. We have been using silver collagen He had his foot x-rayed by his podiatrist but I do not have access to this.  I am going to x-ray the right great toe again. Otherwise I am thinking he is going to require a total contact cast probably next week and I discussed this with him today. 2/9; his x-ray of the right foot did not show plain x-ray evidence of osteomyelitis. We have been using silver alginate the wound is not improved looks static. He is going to get a total contact cast today He comes in today telling me that before he left podiatry before he came here he had noninvasive arterial  studies that were done by a mobile service and the podiatrist office. They are now trying to arrange I think an angiogram however this would be in [facility]. He asked my opinion on this and I told him that angiograms are done by several different doctor groups locally and if he is from [place] I cannot see a reason to go out of [facility] for any procedure he might need. We will see if we can get the actual angiogram report and I will refer him as needed to either vein and vascular or interventional cardiology 2/12; back for his first obligatory total contact cast change. I did get his arterial studies from in [facility]. Biphasic waveforms ABI in the right of 1.16 on the left at 1.14 I am not really sure what was so concerning about this [place] I will simply repeat these studies along with TBI's locally. There should not be any need for him to go out of town to have this evaluation. I am not sure that there is a clinical need 2/16; once again the total contact cast was too loose he has multiple skin excoriations where things were rubbing. He claims to not be on this all that much and is working from home but between the size of his foot the wasting in the distal part of his lower leg and large calfs we just cannot mold the cast in to fit his leg properly.-Changed him to a forefoot off loader but with his foot drop that may not be possible. 2/23; we put him out in a surgical shoe last week as we did not have a forefoot off loader. We have this today. We are using silver alginate. He did not tolerate a total contact cast 3/1; he is in a forefoot offloading boot. Using silver collagen as of last week. No major change 3/8; he is using a forefoot offloading boot. Silver collagen over the last 2 weeks. Perhaps some reduction in depth. Moreover surface looks healthy 3/15; he is a forefoot offloading boot. Silver collagen  over the last 3 weeks. There has been reduction in depth. Much less circumferential callus and thick subcutaneous tissue. 3/22; his original wound on the plantar right great toe is somewhat improved. However he arrives in clinic today with a completely denuded ulcer on the tip of the toe distal to the original wound. More concerning is there is loss of surface epithelium over a wide area around this even dorsally on the toe. He states that he last wrapped the area on Saturday. He was up at [facility] walking this weekend otherwise he had not done anything different. Noticeable for the fact that his forefoot offloading shoe was not in good condition fact that split open this could have had something to do with this but I have no doubt that there is also some degree of infection/cellulitis 3/29; patient came to the  clinic last week with a new wound on the tip of the right great toe. This looked infected there was epithelial loss extending to the base of the first toe dorsally on the right. I gave him empiric doxycycline. Culture I did showed methicillin sensitive staph aureus Proteus and group B strep. I gave him doxycycline which is not a reliable cover of strep or Proteus. I am going to give him a course of Augmentin today. He has not been systemically unwell. 4/5; original wound on the plantar right great toe. 2 weeks ago had a new wound on the tip of the right great toe with extensive cellulitis. Culture I did at that time ultimately showed methicillin sensitive staph aureus Proteus and group B strep. He received some doxycycline I changed him to Augmentin last week he has 3 more days. He is complaining of some pruritus but no rash. X-ray; showed no bony abnormalities in the right great toe. 4/12; no evidence of infection. Right great toe now working on the original wound. The new wound at the tip of the toe is callused but no open area. 4/19; wound measures about the same. Roughly 3 mm of depth.  He has a lot of callus on the tip of the toe where the subsequent wound was however there is no open wound here. Some callus around the circumference of the wound we have been working on Objective Constitutional Patient is hypertensive.. Pulse regular and within target range for patient.Marland Kitchen Respirations regular, non-labored and within target range.. Temperature is normal and within the target range for the patient.Marland Kitchen Appears in no distress. Vitals Time Taken: 8:07 AM, Height: 78 in, Weight: 285 lbs, BMI: 32.9, Temperature: 98.2 F, Pulse: 72 bpm, Respiratory Rate: 18 breaths/min, Blood Pressure: 152/88 mmHg. General Notes: Wound exam; the areas on the tip of the toe has a lot of callus over there is no open wound here. ooThe original wound we have been working on the plantar right great toe is small but still with 3 mm of depth. I removed some callus from around the circumference with a #3 curette there was no bleeding. Base of the wound looks healthy Integumentary (Hair, Skin) Wound #1 status is Open. Original cause of wound was Gradually Appeared. The wound is located on the [employer]. The wound measures 0.3cm length x 0.3cm width x 0.4cm depth; 0.071cm^2 area and 0.028cm^3 volume. There is Fat Layer (Subcutaneous Tissue) Exposed exposed. There is no tunneling or undermining noted. There is a small amount of serosanguineous drainage noted. The wound margin is thickened. There is large (67-100%) red granulation within the wound bed. There is no necrotic tissue within the wound bed. 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 There was a Selective/Open Wound Non-Viable Tissue Debridement with a total area of 0.09 sq cm performed by Maxwell Caul., MD. With the following instrument(s): Curette to remove Non-Viable tissue/material. Material removed includes Callus. No specimens were taken. A time out was conducted at 08:32, prior to the start of the procedure. A Minimum amount of bleeding was controlled with Pressure. The procedure was tolerated well with a pain level  of 0 throughout and a pain level of 0 following the procedure. Post Debridement Measurements: 0.3cm length x 0.3cm width x 0.4cm depth; 0.028cm^3 volume. Character of Wound/Ulcer Post Debridement is improved. Severity of Tissue Post Debridement is: Fat layer exposed. Post procedure Diagnosis Wound #1: Same as Pre-Procedure Plan Follow-up Appointments: Return Appointment in 1 week. Dressing Change Frequency: Wound #1 Right,Plantar Toe Great: Change Dressing every other day. Wound Cleansing: May shower and wash wound with soap and water. Primary Wound Dressing: Wound #1 Right,Plantar Toe Great: Calcium Alginate with Silver Secondary Dressing: Wound #1 Right,Plantar Toe Great: Foam - foam donut Kerlix/Rolled Gauze - secure with tape Dry Gauze Off-Loading: Wedge shoe to: - right foot 1. I am continuing with silver alginate. Thoughts about back to endoform if this does not close in. 2. He showed asked him marks on his forefoot offloading boot. Clearly he puts a lot of pressure on the first toe. I talked to him about keeping the weight back on his heel which is what this is all about. It is not surprising that he has so much callus on the tip of the toe given the appearance of the base of the shoe he is wearing. Electronic Signature(s) Signed: 10/08/2019 5:34:04 PM By: Baltazar Najjar MD Entered By: Baltazar Najjar on 10/08/2019  08:58:12 -------------------------------------------------------------------------------- SuperBill Details Patient Name: Date of Service: Allen Gregory, Allen Gregory 10/08/2019 Medical Record [id number] Patient Account Number: [id number] Date of Birth/Sex: Treating RN: 1961/06/07 (59 y.o. 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

## 2019-10-09 NOTE — Progress Notes (Signed)
Allen Gregory, Allen Gregory ([id number]) Visit Report for 10/08/2019 Arrival Information Details Patient Name: Date of Service: Allen Gregory, Allen Gregory 10/08/2019 8:00 AM Medical Record [id number] Patient Account Number: [id number] Date of Birth/Sex: Treating RN: [birth year] (59 y.o. Allen Gregory) Epps, Allen Gregory Primary Care Kingstyn Deruiter: PATIENT, NO Other Clinician: Referring Marijose Curington: Treating Shaka Zech/Extender:Robson, [name] in Treatment: 13 Visit Information History Since Last Visit All ordered tests and consults were completed: No Patient Arrived: Ambulatory Added or deleted any medications: No Arrival Time: 08:06 Any new allergies or adverse reactions: No Accompanied By: self Had a fall or experienced change in No Transfer Assistance: None activities of daily living that may affect Patient Identification Verified: Yes risk of falls: Secondary Verification Process Yes Signs or symptoms of abuse/neglect since last No Completed: visito Patient Requires Transmission- No Hospitalized since last visit: No Based Precautions: Implantable device outside of the clinic excluding No Patient Has Alerts: Yes cellular tissue based products placed in the center Patient Alerts: Patient on Blood since last visit: Thinner Has Dressing in Place as Prescribed: No Pain Present Now: No Electronic Signature(s) Signed: 10/09/2019 5:39:56 PM By: Yevonne Pax RN Entered By: Yevonne Pax on 10/08/2019 08:07:28 -------------------------------------------------------------------------------- Encounter Discharge Information Details Patient Name: Date of Service: Allen Gregory 10/08/2019 8:00 AM Medical Record [id number] Patient Account Number: [id number] Date of Birth/Sex: Treating RN: 1961-01-24 (59 y.o. Allen Gregory Primary Care Shametra Cumberland: PATIENT, NO Other Clinician: Referring Tjay Velazquez: Treating Issai Werling/Extender:Robson, [name] in Treatment: 13 Encounter Discharge Information Items Post Procedure  Vitals Discharge Condition: Stable Temperature (F): 98.2 Ambulatory Status: Ambulatory Pulse (bpm): 72 Discharge Destination: Home Respiratory Rate (breaths/min): 18 Transportation: Private Auto Blood Pressure (mmHg): 152/88 Accompanied By: self Schedule Follow-up Appointment: Yes Clinical Summary of Care: Patient Declined Electronic Signature(s) Signed: 10/08/2019 5:36:36 PM By: Cherylin Mylar Entered By: Cherylin Mylar on 10/08/2019 08:43:35 -------------------------------------------------------------------------------- Lower Extremity Assessment Details Patient Name: Date of Service: Allen Gregory, Allen Gregory 10/08/2019 8:00 AM Medical Record [id number] Patient Account Number: [id number] Date of Birth/Sex: Treating RN: [birth year] (59 y.o. Allen Gregory) Yevonne Pax Primary Care Nygel Prokop: PATIENT, NO Other Clinician: Referring Shyah Cadmus: Treating Lesly Joslyn/Extender:Robson, [name] in Treatment: 13 Edema Assessment Assessed: [Left: No] [Gregory: No] Edema: [Left: N] [Gregory: o] Calf Left: Gregory: Point of Measurement: 38 cm From Medial Instep cm 44.5 cm Ankle Left: Gregory: Point of Measurement: 10 cm From Medial Instep cm 24 cm Electronic Signature(s) Signed: 10/09/2019 5:39:56 PM By: Yevonne Pax RN Entered By: Yevonne Pax on 10/08/2019 08:11:44 -------------------------------------------------------------------------------- Multi Wound Chart Details Patient Name: Date of Service: Allen Gregory, Allen Gregory 10/08/2019 8:00 AM Medical Record [id number] Patient Account Number: [id number] Date of Birth/Sex: Treating RN: October 01, 1960 (59 y.o. Allen Gregory Primary Care Oriyah Lamphear: PATIENT, NO Other Clinician: Referring Patra Gherardi: Treating Shauntee Karp/Extender:Robson, [name] in Treatment: 13 Vital Signs Height(in): 78 Pulse(bpm): 72 Weight(lbs): 285 Blood Pressure(mmHg): 152/88 Body Mass Index(BMI): 33 Temperature(F): 98.2 Respiratory 18 Rate(breaths/min): Photos: [1:No Photos]  [N/A:N/A] Wound Location: [1:Gregory, Plantar Toe Great] [N/A:N/A] Wounding Event: [1:Gradually Appeared] [N/A:N/A] Primary Etiology: [1:Diabetic Wound/Ulcer of the N/A Lower Extremity] Comorbid History: [1:Pneumothorax, Deep Vein N/A Thrombosis, Type II Diabetes, Neuropathy] Date Acquired: [1:01/20/2019] [N/A:N/A] Weeks of Treatment: [1:13] [N/A:N/A] Wound Status: [1:Open] [N/A:N/A] Measurements L x W x D 0.3x0.3x0.4 [N/A:N/A] (cm) Area (cm) : [1:0.071] [N/A:N/A] Volume (cm) : [1:0.028] [N/A:N/A] % Reduction in Area: [1:84.90%] [N/A:N/A] % Reduction in Volume: 88.10% [N/A:N/A] Classification: [1:Grade 2] [N/A:N/A] Exudate Amount: [1:Small] [N/A:N/A] Exudate Type: [1:Serosanguineous] [N/A:N/A] Exudate Color: [1:red, brown] [N/A:N/A] Wound Margin: [1:Thickened] [N/A:N/A] Granulation Amount: [1:Large (67-100%)] [N/A:N/A] Granulation Quality: [1:Red] [N/A:N/A] Necrotic Amount: [1:None Present (0%)] [  N/A:N/A] Exposed Structures: [1:Fat Layer (Subcutaneous N/A Tissue) Exposed: Yes Fascia: No Tendon: No Muscle: No Joint: No Bone: No] Epithelialization: [1:Medium (34-66%)] [N/A:N/A] Debridement: [1:Debridement - Selective/Open Wound] [N/A:N/A] Pre-procedure [1:08:32] [N/A:N/A] Verification/Time Out Taken: Tissue Debrided: [1:Callus] [N/A:N/A] Level: [1:Non-Viable Tissue] [N/A:N/A] Debridement Area (sq cm):0.09 [N/A:N/A] Instrument: [1:Curette] [N/A:N/A] Bleeding: [1:Minimum] [N/A:N/A] Hemostasis Achieved: [1:Pressure] [N/A:N/A] Procedural Pain: [1:0] [N/A:N/A] Post Procedural Pain: [1:0] [N/A:N/A] Debridement Treatment Procedure was tolerated [N/A:N/A] Response: [1:well] Post Debridement [1:0.3x0.3x0.4] [N/A:N/A] Measurements L x W x D (cm) Post Debridement [1:0.028] [N/A:N/A] Volume: (cm) Procedures Performed: [1:Debridement] [N/A:N/A] Treatment Notes Wound #1 (Gregory, Plantar Toe Great) 1. Cleanse With Wound Cleanser 2. Periwound Care Skin Prep 3. Primary Dressing  Applied Calcium Alginate Ag 4. Secondary Dressing Dry Gauze Foam 5. Secured With Tape 7. Footwear/Offloading device applied Felt/Foam Other footwear/offloading device (specify in notes) Notes felt as secondary. front Research scientist (medical)) Signed: 10/08/2019 5:34:04 PM By: Baltazar Najjar MD Signed: 10/08/2019 6:02:31 PM By: Zandra Abts RN, BSN Entered By: Baltazar Najjar on 10/08/2019 08:55:30 -------------------------------------------------------------------------------- Multi-Disciplinary Care Plan Details Patient Name: Date of Service: Allen Gregory, Allen Gregory 10/08/2019 8:00 AM Medical Record [id number] Patient Account Number: [id number] Date of Birth/Sex: Treating RN: [birth year] (59 y.o. Allen Gregory Primary Care Naithen Rivenburg: PATIENT, NO Other Clinician: Referring Carrianne Hyun: Treating Clay Solum/Extender:Robson, [name] in Treatment: 13 Active Inactive Wound/Skin Impairment Nursing Diagnoses: Impaired tissue integrity Knowledge deficit related to ulceration/compromised skin integrity Goals: Patient/caregiver will verbalize understanding of skin care regimen Date Initiated: 07/09/2019 Target Resolution Date: 11/02/2019 Goal Status: Active Interventions: Assess patient/caregiver ability to obtain necessary supplies Assess patient/caregiver ability to perform ulcer/skin care regimen upon admission and as needed Assess ulceration(s) every visit Provide education on ulcer and skin care Notes: Electronic Signature(s) Signed: 10/08/2019 6:02:31 PM By: Zandra Abts RN, BSN Entered By: Zandra Abts on 10/08/2019 01:09:32 -------------------------------------------------------------------------------- Pain Assessment Details Patient Name: Date of Service: Allen Gregory, Allen Gregory 10/08/2019 8:00 AM Medical Record [id number] Patient Account Number: [id number] Date of Birth/Sex: Treating RN: Mar 24, 1961 (58 y.o. Allen Gregory) Yevonne Pax Primary Care Aiya Keach: PATIENT,  NO Other Clinician: Referring Laylonie Marzec: Treating Jermall Isaacson/Extender:Robson, [name] in Treatment: 13 Active Problems Location of Pain Severity and Description of Pain Patient Has Paino No Site Locations Pain Management and Medication Current Pain Management: Electronic Signature(s) Signed: 10/09/2019 5:39:56 PM By: Yevonne Pax RN Entered By: Yevonne Pax on 10/08/2019 08:08:03 -------------------------------------------------------------------------------- Patient/Caregiver Education Details Patient Name: Date of Service: Allen Gregory, Allen Gregory 4/19/2021andnbsp8:00 AM Medical Record [id number] Patient Account Number: [id number] Date of Birth/Gender: [birth year] (58 y.o. M) Treating RN: Zandra Abts Primary Care Physician: PATIENT, NO Other Clinician: Referring Physician: Treating Physician/Extender:Robson, [name] in Treatment: 13 Education Assessment Education Provided To: Patient Education Topics Provided Wound/Skin Impairment: Methods: Explain/Verbal Responses: State content correctly Electronic Signature(s) Signed: 10/08/2019 6:02:31 PM By: Zandra Abts RN, BSN Entered By: Zandra Abts on 10/08/2019 08:26:59 -------------------------------------------------------------------------------- Wound Assessment Details Patient Name: Date of Service: Allen Gregory, Allen Gregory 10/08/2019 8:00 AM Medical Record [id number] Patient Account Number: [id number] Date of Birth/Sex: Treating RN: 10-22-1960 (58 y.o. Allen Gregory) Yevonne Pax Primary Care Chong January: PATIENT, NO Other Clinician: Referring Coraline Talwar: Treating Jeanise Durfey/Extender:Robson, [name] in Treatment: 13 Wound Status Wound Number: 1 Primary Diabetic Wound/Ulcer of the Lower Etiology: Extremity Wound Location: Gregory, Plantar Toe Great Wound Open Wounding Event: Gradually Appeared Status: Date Acquired: 01/20/2019 Comorbid Pneumothorax, Deep Vein Thrombosis, Type Weeks Of Treatment: 13 History: II Diabetes,  Neuropathy Clustered Wound: No Wound Measurements Length: (cm) 0.3 % Reducti Width: (cm) 0.3 % Reducti Depth: (cm) 0.4 Epithelia Area: (cm) 0.071 Tunnelin Volume: (cm) 0.028  Undermin Wound Description Classification: Grade 2 Wound Margin: Thickened Exudate Amount: Small Exudate Type: Serosanguineous Exudate Color: red, brown Wound Bed Granulation Amount: Large (67-100%) Granulation Quality: Red Necrotic Amount: None Present (0%) Foul Odor After Cleansing: No Slough/Fibrino No Exposed Structure Fascia Exposed: No Fat Layer (Subcutaneous Tissue) Exposed: Yes Tendon Exposed: No Muscle Exposed: No Joint Exposed: No Bone Exposed: No on in Area: 84.9% on in Volume: 88.1% lization: Medium (34-66%) g: No ing: No Treatment Notes Wound #1 (Gregory, Plantar Toe Great) 1. Cleanse With Wound Cleanser 2. Periwound Care Skin Prep 3. Primary Dressing Applied Calcium Alginate Ag 4. Secondary Dressing Dry Gauze Foam 5. Secured With Tape 7. Footwear/Offloading device applied Felt/Foam Other footwear/offloading device (specify in notes) Notes felt as secondary. front Dietitian) Signed: 10/09/2019 5:39:56 PM By: Carlene Coria RN Entered By: Carlene Coria on 10/08/2019 08:12:13 -------------------------------------------------------------------------------- Vitals Details Patient Name: Date of Service: Allen Gregory, Allen Gregory 10/08/2019 8:00 AM Medical Record [id number] Patient Account Number: [id number] Date of Birth/Sex: Treating RN: 1960/06/28 (58 y.o. [name]) Carlene Coria Primary Care Krisandra Bueno: PATIENT, NO Other Clinician: Referring Joycelyn Liska: Treating Hazelle Woollard/Extender:Robson, Esperanza Richters in Treatment: 13 Vital Signs Time Taken: 08:07 Temperature (F): 98.2 Height (in): 78 Pulse (bpm): 72 Weight (lbs): 285 Respiratory Rate (breaths/min): 18 Body Mass Index (BMI): 32.9 Blood Pressure (mmHg): 152/88 Reference Range: 80 - 120 mg / dl Electronic  Signature(s) Signed: 10/09/2019 5:39:56 PM By: Carlene Coria RN Entered By: Carlene Coria on 10/08/2019 08:07:45

## 2019-10-15 ENCOUNTER — Encounter (HOSPITAL_BASED_OUTPATIENT_CLINIC_OR_DEPARTMENT_OTHER): Payer: 59 | Admitting: Internal Medicine

## 2019-10-15 ENCOUNTER — Other Ambulatory Visit: Payer: Self-pay

## 2019-10-15 DIAGNOSIS — E11621 Type 2 diabetes mellitus with foot ulcer: Secondary | ICD-10-CM | POA: Diagnosis not present

## 2019-10-15 NOTE — Progress Notes (Signed)
TOUA, STITES ([id number]) Visit Report for 10/15/2019 Debridement Details Patient Name: Date of Service: ECTOR, LAUREL 10/15/2019 1:00 PM Medical Record [id number] Patient Account Number: [id number] Date of Birth/Sex: Treating RN: December 19, 1960 (59 y.o. [name] Primary Care Provider: PATIENT, NO Other Clinician: Referring Provider: Treating Provider/Extender:Kaladin Noseworthy, [name] in Treatment: 14 Debridement Performed for Wound #1 Right,Plantar Toe Great Assessment: Performed By: Physician Maxwell Caul., MD Debridement Type: Debridement Severity of Tissue Pre Fat layer exposed Debridement: Level of Consciousness (Pre- Awake and Alert procedure): Pre-procedure Verification/Time Out Taken: Yes - 13:19 Start Time: 13:19 Total Area Debrided (L x W): 0.5 (cm) x 0.4 (cm) = 0.2 (cm) Tissue and other material Viable, Non-Viable, Callus, Subcutaneous debrided: Level: Skin/Subcutaneous Tissue Debridement Description: Excisional Instrument: Curette Bleeding: Minimum Hemostasis Achieved: Pressure End Time: 13:21 Procedural Pain: 0 Post Procedural Pain: 0 Response to Treatment: Procedure was tolerated well Level of Consciousness Awake and Alert (Post-procedure): Post Debridement Measurements of Total Wound Length: (cm) 0.5 Width: (cm) 0.4 Depth: (cm) 0.5 Volume: (cm) 0.079 Character of Wound/Ulcer Post Improved Debridement: Severity of Tissue Post Debridement: Fat layer exposed Post Procedure Diagnosis Same as Pre-procedure Electronic Signature(s) Signed: 10/15/2019 5:33:33 PM By: Zandra Abts RN, BSN Signed: 10/15/2019 5:40:04 PM By: Baltazar Najjar MD Entered By: Baltazar Najjar on 10/15/2019 13:31:21 -------------------------------------------------------------------------------- Debridement Details Patient Name: Date of Service: KENDALE, REMBOLD 10/15/2019 1:00 PM Medical Record [id number] Patient Account Number: [id number] Date of  Birth/Sex: Treating RN: February 23, 1961 (59 y.o. [name] Primary Care Provider: PATIENT, NO Other Clinician: Referring Provider: Treating Provider/Extender:Lorice Lafave, [name] in Treatment: 14 Debridement Performed for Wound #4 Right,Medial Toe Great Assessment: Performed By: Physician Maxwell Caul., MD Debridement Type: Debridement Severity of Tissue Pre Fat layer exposed Debridement: Level of Consciousness (Pre- Awake and Alert procedure): Pre-procedure Pre-procedure Yes - 13:19 Verification/Time Out Taken: Start Time: 13:19 Total Area Debrided (L x W): 0.5 (cm) x 0.8 (cm) = 0.4 (cm) Tissue and other material Viable, Non-Viable, Callus, Subcutaneous debrided: Level: Skin/Subcutaneous Tissue Debridement Description: Excisional Instrument: Curette Bleeding: Minimum Hemostasis Achieved: Pressure End Time: 13:21 Procedural Pain: 0 Post Procedural Pain: 0 Response to Treatment: Procedure was tolerated well Level of Consciousness Awake and Alert (Post-procedure): Post Debridement Measurements of Total Wound Length: (cm) 0.5 Width: (cm) 0.8 Depth: (cm) 0.1 Volume: (cm) 0.031 Character of Wound/Ulcer Post Improved Debridement: Severity of Tissue Post Debridement: Fat layer exposed Post Procedure Diagnosis Same as Pre-procedure Electronic Signature(s) Signed: 10/15/2019 5:33:33 PM By: Zandra Abts RN, BSN Signed: 10/15/2019 5:40:04 PM By: Baltazar Najjar MD Entered By: Baltazar Najjar on 10/15/2019 13:31:29 -------------------------------------------------------------------------------- HPI Details Patient Name: Date of Service: LLEWELYN, SHEAFFER 10/15/2019 1:00 PM Medical Record [id number] Patient Account Number: [id number] Date of Birth/Sex: Treating RN: 12-06-1960 (59 y.o. [name] Primary Care Provider: PATIENT, NO Other Clinician: Referring Provider: Treating Provider/Extender:Tarren Velardi, [name] in Treatment: 14 History of Present  Illness HPI Description: ADMISSION 07/09/2019 This is a pleasant 59 year old man who referred himself here for second opinion sign a new area on the right plantar first toe and the dorsal part of his right fifth toe. He says he had these in August when he had removed callus from the first toe and then played pool volleyball for about 4 hours. He thinks the bottom of the pool simply caused an abrasion of the toe. The story sounds accurate. He has been going to podiatry for the last several months. Me a picture on his phone from August and the wound is gotten considerably smaller. He states that  it started doing well recently when they started collagen. He has been using a surgical shoe to offload. 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 He had a brace for a long time but now he simply lifts his foot higher to clear but states that most people would not even notice that he had any disability. He is a type 2 diabetes Badik but he has diet-controlled he has lost weight by watching calories. 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 07/16/2019; right fifth toe is closed. Right first toe is smaller. He is using a surgical shoe but he tells me is fairly religious about using his scooter at home. Hopefully this will give him enough offloading to close this. 2/1; right fifth toe dorsally remains closed. Right first toe still not a lot of improvement. Although superficially it looks smaller there is undermining laterally from 6-6 of at least 3 to 4 mm. Also worrisome is that the granulation does not look that healthy. We have been using silver collagen He had his foot x-rayed by his podiatrist but I do not  have access to this. I am going to x-ray the right great toe again. Otherwise I am thinking he is going to require a total contact cast probably next week and I discussed this with him today. 2/9; his x-ray of the right foot did not show plain x-ray evidence of osteomyelitis. We have been using silver alginate the wound is not improved looks static. He is going to get a total contact cast today He comes in today telling me that before he left podiatry before he came here he had noninvasive arterial studies that were done by a mobile service and the podiatrist office. They are now trying to arrange I think an angiogram however this would be in [facility]. He asked my opinion on this and I told him that angiograms are done by several different doctor groups locally and if he is from [place] I cannot see a reason to go out of [facility] for any procedure he might need. We will see if we can get the actual angiogram report and I will refer him as needed to either vein and vascular or interventional cardiology 2/12; back for his first obligatory total contact cast change. I did get his arterial studies from in [facility]. Biphasic waveforms ABI in the right of 1.16 on the left at 1.14 I am not really sure what was so concerning about this [place] I will simply repeat these studies along with TBI's locally. There should not be any need for him to go out of town to have this evaluation. I am not sure that there is a clinical need 2/16; once again the total contact cast was too loose he has multiple skin excoriations where things were rubbing. He claims to not be on this all that much and is working from home but between the size of his foot the wasting in the distal part of his lower leg and large calfs we just cannot mold the cast in to fit his leg properly.-Changed him to a forefoot off loader but  with his foot drop that  may not be possible. 2/23; we put him out in a surgical shoe last week as we did not have a forefoot off loader. We have this today. We are using silver alginate. He did not tolerate a total contact cast 3/1; he is in a forefoot offloading boot. Using silver collagen as of last week. No major change 3/8; he is using a forefoot offloading boot. Silver collagen over the last 2 weeks. Perhaps some reduction in depth. Moreover surface looks healthy 3/15; he is a forefoot offloading boot. Silver collagen over the last 3 weeks. There has been reduction in depth. Much less circumferential callus and thick subcutaneous tissue. 3/22; his original wound on the plantar right great toe is somewhat improved. However he arrives in clinic today with a completely denuded ulcer on the tip of the toe distal to the original wound. More concerning is there is loss of surface epithelium over a wide area around this even dorsally on the toe. He states that he last wrapped the area on Saturday. He was up at [facility] walking this weekend otherwise he had not done anything different. Noticeable for the fact that his forefoot offloading shoe was not in good condition fact that split open this could have had something to do with this but I have no doubt that there is also some degree of infection/cellulitis 3/29; patient came to the clinic last week with a new wound on the tip of the right great toe. This looked infected there was epithelial loss extending to the base of the first toe dorsally on the right. I gave him empiric doxycycline. Culture I did showed methicillin sensitive staph aureus Proteus and group B strep. I gave him doxycycline which is not a reliable cover of strep or Proteus. I am going to give him a course of Augmentin today. He has not been systemically unwell. 4/5; original wound on the plantar right great toe. 2 weeks ago had a new wound on the tip of the right great toe with extensive cellulitis.  Culture I did at that time ultimately showed methicillin sensitive staph aureus Proteus and group B strep. He received some doxycycline I changed him to Augmentin last week he has 3 more days. He is complaining of some pruritus but no rash. X-ray; showed no bony abnormalities in the right great toe. 4/12; no evidence of infection. Right great toe now working on the original wound. The new wound at the tip of the toe is callused but no open area. 4/19; wound measures about the same. Roughly 3 mm of depth. He has a lot of callus on the tip of the toe where the subsequent wound was however there is no open wound here. Some callus around the circumference of the wound we have been working on 4/26; the wound that he originally had on the plantar aspect about the same. Medially he had a new wound which he says started as an "blood blister". Obviously this has unroofed if they were true. We have been using silver alginate Electronic Signature(s) Signed: 10/15/2019 5:40:04 PM By: Baltazar Najjar MD Entered By: Baltazar Najjar on 10/15/2019 13:32:06 -------------------------------------------------------------------------------- Physical Exam Details Patient Name: Date of Service: OVERTON, BOGGUS 10/15/2019 1:00 PM Medical Record [id number] Patient Account Number: [id number] Date of Birth/Sex: Treating RN: 1961/01/31 (59 y.o. [name] Primary Care Provider: PATIENT, NO Other Clinician: Referring Provider: Treating Provider/Extender:Blayke Pinera, [name] in Treatment: 14 Notes Wound exam; the area on the tip of the toe  has a lot of callus but there is no open wound The original wound has raised callus thick subcutaneous tissue around the wound margin removed with a #5 curette He has a new open wound more medially over the medial part of the inner phalangeal. This was also debrided of thick surrounding skin and subcutaneous tissue. Hemostasis with direct pressure in both areas there is  no evidence of infection Electronic Signature(s) Signed: 10/15/2019 5:40:04 PM By: Baltazar Najjar MD Entered By: Baltazar Najjar on 10/15/2019 13:33:01 -------------------------------------------------------------------------------- Physician Orders Details Patient Name: Date of Service: SHERMAINE, BRIGHAM 10/15/2019 1:00 PM Medical Record [id number] Patient Account Number: [id number] Date of Birth/Sex: Treating RN: 27-Nov-1960 (59 y.o. 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 Dressing Change Frequency Wound #1 Right,Plantar Toe Great Change Dressing every other day. Wound #4 Right,Medial Toe Great Change Dressing every other day. Wound Cleansing May shower and wash wound with soap and water. Primary Wound Dressing Wound #1 Right,Plantar Toe Great Silver Collagen - moisten with hydrogel or KY jelly Wound #4 Right,Medial Toe Great Silver Collagen - moisten with hydrogel or KY jelly Secondary Dressing Wound #1 Right,Plantar Toe Great Foam - foam donut Kerlix/Rolled Gauze - secure with tape Dry Gauze Wound #4 Right,Medial Toe Great Foam - foam donut Kerlix/Rolled Gauze - secure with tape Dry Gauze Off-Loading Wedge shoe to: - right foot Electronic Signature(s) Signed: 10/15/2019 5:33:33 PM By: Zandra Abts RN, BSN Signed: 10/15/2019 5:40:04 PM By: Baltazar Najjar MD Entered By: Zandra Abts on 10/15/2019  13:24:22 -------------------------------------------------------------------------------- Problem List Details Patient Name: Date of Service: AIZEN, DUVAL 10/15/2019 1:00 PM Medical Record [id number] Patient Account Number: [id number] Date of Birth/Sex: Treating RN: May 08, 1961 (59 y.o. [name] Primary Care Provider: PATIENT, NO Other Clinician: Referring Provider: Treating Provider/Extender:Neftaly Inzunza, [name] in Treatment: 14 Active Problems ICD-10 Encounter Code Description Active Date MDM Diagnosis E11.621 Type 2 diabetes mellitus with foot ulcer 07/09/2019 No Yes L97.512 Non-pressure chronic ulcer of other part of right foot with 07/09/2019 No Yes fat layer exposed E11.42 Type 2 diabetes mellitus with diabetic polyneuropathy 07/09/2019 No Yes M21.371 Foot drop, right foot 07/09/2019 No Yes L03.031 Cellulitis of right toe 09/10/2019 No Yes Inactive Problems ICD-10 Code Description Active Date Inactive Date E11.51 Type 2 diabetes mellitus with diabetic peripheral angiopathy 07/31/2019 07/31/2019 without gangrene Resolved Problems Electronic Signature(s) Signed: 10/15/2019 5:40:04 PM By: Baltazar Najjar MD Entered By: Baltazar Najjar on 10/15/2019 13:31:01 -------------------------------------------------------------------------------- Progress Note Details Patient Name: Date of Service: TYRION, GLAUDE 10/15/2019 1:00 PM Medical Record [id number] Patient Account Number: [id number] Date of Birth/Sex: Treating RN: 1960/07/30 (59 y.o. [name] Primary Care Provider: PATIENT, NO Other Clinician: Referring Provider: Treating Provider/Extender:Yvon Mccord, [name] in Treatment: 14 Subjective History of Present Illness (HPI) ADMISSION 07/09/2019 This is a pleasant 59 year old man who referred himself here for second opinion sign a new area on the right plantar first toe and the dorsal part of his right fifth toe. He says he had these in August when he had  removed callus from the first toe and then played pool volleyball for about 4 hours. He thinks the bottom  of the pool simply caused an abrasion of the toe. The story sounds accurate. He has been going to podiatry for the last several months. Me a picture on his phone from August and the wound is gotten considerably smaller. He states that it started doing well recently when they started collagen. He has been using a surgical shoe to offload. 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 He had a brace for a long time but now he simply lifts his foot higher to clear but states that most people would not even notice that he had any disability. He is a type 2 diabetes Badik but he has diet-controlled he has lost weight by watching calories. 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 07/16/2019; right fifth toe is closed. Right first toe is smaller. He is using a surgical shoe but he tells me is fairly religious about using his scooter at home. Hopefully this will give him enough offloading to close this. 2/1; right fifth toe dorsally remains closed. Right first toe still not a lot of improvement. Although superficially it looks smaller there is undermining laterally from 6-6 of at least 3 to 4 mm. Also worrisome is that the granulation does not look that healthy. We have been using silver collagen He had his foot x-rayed by his podiatrist but I do not have access to this. I am going to x-ray the right great toe again. Otherwise I am thinking he is going to require a total contact cast probably next week and I discussed this with him today. 2/9; his x-ray of the right foot did not show plain x-ray evidence of  osteomyelitis. We have been using silver alginate the wound is not improved looks static. He is going to get a total contact cast today He comes in today telling me that before he left podiatry before he came here he had noninvasive arterial studies that were done by a mobile service and the podiatrist office. They are now trying to arrange I think an angiogram however this would be in [facility]. He asked my opinion on this and I told him that angiograms are done by several different doctor groups locally and if he is from [place] see a reason to go out of [facility] for any procedure he might need. We will see if we can get the actual angiogram report and I will refer him as needed to either vein and vascular or interventional cardiology 2/12; back for his first obligatory total contact cast change. I did get his arterial studies from in [facility]. Biphasic waveforms ABI in the right of 1.16 on the left at 1.14 I am not really sure what was so concerning about this [place] I will simply repeat these studies along with TBI's locally. There should not be any need for him to go out of town to have this evaluation. I am not sure that there is a clinical need 2/16; once again the total contact cast was too loose he has multiple skin excoriations where things were rubbing. He claims to not be on this all that much and  is working from home but between the size of his foot the wasting in the distal part of his lower leg and large calfs we just cannot mold the cast in to fit his leg properly.-Changed him to a forefoot off loader but with his foot drop that may not be possible. 2/23; we put him out in a surgical shoe last week as we did not have a forefoot off loader. We have this today. We are using silver alginate. He did not tolerate a total contact cast 3/1; he is in a forefoot offloading boot. Using silver  collagen as of last week. No major change 3/8; he is using a forefoot offloading boot. Silver collagen over the last 2 weeks. Perhaps some reduction in depth. Moreover surface looks healthy 3/15; he is a forefoot offloading boot. Silver collagen over the last 3 weeks. There has been reduction in depth. Much less circumferential callus and thick subcutaneous tissue. 3/22; his original wound on the plantar right great toe is somewhat improved. However he arrives in clinic today with a completely denuded ulcer on the tip of the toe distal to the original wound. More concerning is there is loss of surface epithelium over a wide area around this even dorsally on the toe. He states that he last wrapped the area on Saturday. He was up at [facility] walking this weekend otherwise he had not done anything different. Noticeable for the fact that his forefoot offloading shoe was not in good condition fact that split open this could have had something to do with this but I have no doubt that there is also some degree of infection/cellulitis 3/29; patient came to the clinic last week with a new wound on the tip of the right great toe. This looked infected there was epithelial loss extending to the base of the first toe dorsally on the right. I gave him empiric doxycycline. Culture I did showed methicillin sensitive staph aureus Proteus and group B strep. I gave him doxycycline which is not a reliable cover of strep or Proteus. I am going to give him a course of Augmentin today. He has not been systemically unwell. 4/5; original wound on the plantar right great toe. 2 weeks ago had a new wound on the tip of the right great toe with extensive cellulitis. Culture I did at that time ultimately showed methicillin sensitive staph aureus Proteus and group B strep. He received some doxycycline I changed him to Augmentin last week he has 3 more days. He is complaining of some pruritus but no rash. X-ray; showed no bony  abnormalities in the right great toe. 4/12; no evidence of infection. Right great toe now working on the original wound. The new wound at the tip of the toe is callused but no open area. 4/19; wound measures about the same. Roughly 3 mm of depth. He has a lot of callus on the tip of the toe where the subsequent wound was however there is no open wound here. Some callus around the circumference of the wound we have been working on 4/26; the wound that he originally had on the plantar aspect about the same. Medially he had a new wound which he says started as an "blood blister". Obviously this has unroofed if they were true. We have been using silver alginate Objective Constitutional Vitals Time Taken: 12:55 PM, Height: 78 in, Weight: 285 lbs, BMI: 32.9, Temperature: 98.5 F, Pulse: 84 bpm, Respiratory Rate: 18 breaths/min, Blood Pressure: 149/66 mmHg. Integumentary (Hair, Skin) Wound #  1 status is Open. Original cause of wound was Gradually Appeared. The wound is located on the [employer]. The wound measures 0.5cm length x 0.4cm width x 0.5cm depth; 0.157cm^2 area and 0.079cm^3 volume. There is Fat Layer (Subcutaneous Tissue) Exposed exposed. There is no tunneling noted, however, there is undermining starting at 3:00 and ending at 9:00 with a maximum distance of 0.7cm. There is a small amount of serosanguineous drainage noted. The wound margin is thickened. There is large (67-100%) red granulation within the wound bed. There is no necrotic tissue within the wound bed. General Notes: areas of purple/maroon Wound #4 status is Open. Original cause of wound was Blister. The wound is located on the [employer]. The wound measures 0.5cm length x 0.8cm width x 0.1cm depth; 0.314cm^2 area and 0.031cm^3 volume. There is Fat Layer (Subcutaneous Tissue) Exposed exposed. There is no tunneling or undermining noted. There is a medium amount of serosanguineous drainage noted. The wound  margin is flat and intact. There is large (67-100%) pink granulation within the wound bed. There is a small (1-33%) amount of necrotic tissue within the wound bed including Adherent Slough. 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 There was a Excisional Skin/Subcutaneous Tissue Debridement with a total area of 0.2 sq cm performed by Maxwell Caul., MD. With the following instrument(s): Curette to remove Viable and Non-Viable tissue/material. Material removed includes Callus and Subcutaneous Tissue and. A time out was conducted at 13:19, prior to the start of the procedure. A Minimum amount of bleeding was controlled with Pressure. The procedure was tolerated well with a pain level of 0 throughout and a pain level of 0 following the procedure. Post Debridement Measurements: 0.5cm length x 0.4cm width x 0.5cm depth; 0.079cm^3 volume. Character of Wound/Ulcer Post Debridement is improved. Severity of Tissue Post Debridement is: Fat layer exposed. Post procedure Diagnosis Wound #1: Same as Pre-Procedure Wound #4 Pre-procedure diagnosis of Wound #4 is a Diabetic Wound/Ulcer of the Lower Extremity located on the Right,Medial Toe Great .Severity of Tissue Pre Debridement is: Fat layer exposed. There was a Excisional Skin/Subcutaneous Tissue Debridement with a total area of 0.4 sq cm performed by Maxwell Caul., MD. With the following instrument(s): Curette to remove Viable and Non-Viable tissue/material. Material removed includes Callus and Subcutaneous Tissue and. A time out was  conducted at 13:19, prior to the start of the procedure. A Minimum amount of bleeding was controlled with Pressure. The procedure was tolerated well with a pain level of 0 throughout and a pain level of 0 following the procedure. Post Debridement Measurements: 0.5cm length x 0.8cm width x 0.1cm depth; 0.031cm^3 volume. Character of Wound/Ulcer Post Debridement is improved. Severity of Tissue Post Debridement is: Fat layer exposed. Post procedure Diagnosis Wound #4: Same as Pre-Procedure Plan Follow-up Appointments: Return Appointment in 1 week. Dressing Change Frequency: Wound #1 Right,Plantar Toe Great: Change Dressing every other day. Wound #4 Right,Medial Toe Great: Change Dressing every other day. Wound Cleansing: May shower and wash wound with soap and water. Primary Wound Dressing: Wound #1 Right,Plantar Toe Great: Silver Collagen - moisten with hydrogel or KY jelly  Wound #4 Right,Medial Toe Great: Silver Collagen - moisten with hydrogel or KY jelly Secondary Dressing: Wound #1 Right,Plantar Toe Great: Foam - foam donut Kerlix/Rolled Gauze - secure with tape Dry Gauze Wound #4 Right,Medial Toe Great: Foam - foam donut Kerlix/Rolled Gauze - secure with tape Dry Gauze Off-Loading: Wedge shoe to: - right foot 1. I change the primary dressing to silver collagen 2. I also removed the thick mycotic nail which was just about off in any case on the right first toe Electronic Signature(s) Signed: 10/15/2019 5:40:04 PM By: Baltazar Najjarobson, Sonja Manseau MD Entered By: Baltazar Najjarobson, Chandi Nicklin on 10/15/2019 13:33:39 -------------------------------------------------------------------------------- SuperBill Details Patient Name: Date of Service: Kizzie FurnishROSOL, Aarya 10/15/2019 Medical Record [id number] Patient Account Number: [id number] Date of Birth/Sex: Treating RN: 10/13/1960 (59 y.o. 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 M21.371 Foot drop, right foot L03.031 Cellulitis of right toe Facility Procedures CPT4 Code Description: [id number] 11042 - DEB SUBQ TISSUE 20 SQ CM/< ICD-10 Diagnosis Description L97.512 Non-pressure chronic ulcer of other part of right foot with Modifier: fat layer expos Quantity: 1 ed Physician Procedures CPT4 Code Description: [id number] 11042 - WC PHYS SUBQ TISS 20 SQ CM ICD-10 Diagnosis Description L97.512 Non-pressure chronic ulcer of other part of right foot with Modifier: fat layer expos Quantity: 1 ed Electronic Signature(s) Signed: 10/15/2019 5:40:04 PM By: Baltazar Najjarobson, Suvi Archuletta MD Entered By: Baltazar Najjarobson, Sherese Heyward on 10/15/2019 13:33:54

## 2019-10-16 NOTE — Progress Notes (Signed)
Allen Gregory, Allen Gregory ([id number]) Visit Report for 10/15/2019 Arrival Information Details Patient Name: Date of Service: Allen Gregory, Allen Gregory 10/15/2019 1:00 PM Medical Record [id number] Patient Account Number: [id number] Date of Birth/Sex: Treating RN: 1961-05-07 (59 y.o. [name] Primary Care Clytie Shetley: PATIENT, NO Other Clinician: Referring Diontae Route: Treating Nicholson Starace/Extender:Robson, [name] in Treatment: 14 Visit Information History Since Last Visit Added or deleted any medications: No Patient Arrived: Ambulatory Any new allergies or adverse reactions: No Arrival Time: 12:58 Had a fall or experienced change in No Accompanied By: self activities of daily living that may affect Transfer Assistance: None risk of falls: Patient Identification Verified: Yes Signs or symptoms of abuse/neglect since last visito No Secondary Verification Process Yes Hospitalized since last visit: No Completed: Implantable device outside of the clinic excluding No Patient Requires Transmission-Based No cellular tissue based products placed in the center Precautions: since last visit: Patient Has Alerts: Yes Has Dressing in Place as Prescribed: Yes Patient Alerts: Patient on Blood Pain Present Now: No Thinner Electronic Signature(s) Signed: 10/16/2019 5:26:58 PM By: Cherylin Mylar Entered By: Cherylin Mylar on 10/15/2019 12:59:15 -------------------------------------------------------------------------------- Encounter Discharge Information Details Patient Name: Date of Service: Allen Gregory, Allen Gregory 10/15/2019 1:00 PM Medical Record [id number] Patient Account Number: [id number] Date of Birth/Sex: Treating RN: 1960-10-01 (59 y.o. [name] Primary Care Marolyn Urschel: PATIENT, NO Other Clinician: Referring Voshon Petro: Treating Tita Terhaar/Extender:Robson, [name] in Treatment: 14 Encounter Discharge Information Items Post Procedure Vitals Discharge Condition:  Stable Temperature (F): 98.5 Ambulatory Status: Ambulatory Pulse (bpm): 84 Discharge Destination: Home Respiratory Rate (breaths/min): 18 Transportation: Private Auto Blood Pressure (mmHg): 149/66 Accompanied By: self Schedule Follow-up Appointment: Yes Clinical Summary of Care: Electronic Signature(s) Signed: 10/15/2019 5:14:33 PM By: Shawn Stall Entered By: Shawn Stall on 10/15/2019 13:37:43 -------------------------------------------------------------------------------- Lower Extremity Assessment Details Patient Name: Date of Service: Allen Gregory, Allen Gregory 10/15/2019 1:00 PM Medical Record [id number] Patient Account Number: [id number] Date of Birth/Sex: Treating RN: Dec 23, 1960 (59 y.o. [name] Primary Care Katy Brickell: PATIENT, NO Other Clinician: Referring Britt Petroni: Treating Harlan Vinal/Extender:Robson, [name] in Treatment: 14 Edema Assessment Assessed: [Left: No] [Right: No] Edema: [Left: N] [Right: o] Calf Left: Right: Point of Measurement: 38 cm From Medial Instep cm 46 cm Ankle Left: Right: Point of Measurement: 10 cm From Medial Instep cm 24 cm Vascular Assessment Pulses: Dorsalis Pedis Palpable: [Right:Yes] Electronic Signature(s) Signed: 10/16/2019 5:26:58 PM By: Cherylin Mylar Entered By: Cherylin Mylar on 10/15/2019 13:00:10 -------------------------------------------------------------------------------- Multi Wound Chart Details Patient Name: Date of Service: Allen Gregory, Allen Gregory 10/15/2019 1:00 PM Medical Record [id number] Patient Account Number: [id number] Date of Birth/Sex: Treating RN: 1961-03-13 (59 y.o. [name] Primary Care Dawayne Ohair: PATIENT, NO Other Clinician: Referring Jasher Barkan: Treating Cherokee Clowers/Extender:Robson, [name] in Treatment: 14 Vital Signs Height(in): 78 Pulse(bpm): 84 Weight(lbs): 285 Blood Pressure(mmHg):149/66 Body Mass Index(BMI): 33 Temperature(F):  98.5 Respiratory 18 Rate(breaths/min): Photos: [1:No Photos] [4:No Photos] [N/A:N/A] Wound Location: [1:Right, Plantar Toe Great] [4:Right, Medial Toe Great] [N/A:N/A] Wounding Event: [1:Gradually Appeared] [4:Blister] [N/A:N/A] Primary Etiology: [1:Diabetic Wound/Ulcer of the Diabetic Wound/Ulcer of the N/A Lower Extremity] [4:Lower Extremity] Comorbid History: [1:Pneumothorax, Deep Vein Pneumothorax, Deep Vein N/A Thrombosis, Type II Diabetes, Neuropathy] [4:Thrombosis, Type II Diabetes, Neuropathy] Date Acquired: [1:01/20/2019] [4:10/15/2019] [N/A:N/A] Weeks of Treatment: [1:14] [4:0] [N/A:N/A] Wound Status: [1:Open] [4:Open] [N/A:N/A] Measurements L x W x D 0.5x0.4x0.5 [4:0.5x0.8x0.1] [N/A:N/A] (cm) Area (cm) : [1:0.157] [4:0.314] [N/A:N/A] Volume (cm) : [1:0.079] [4:0.031] [N/A:N/A] % Reduction in Area: [1:66.70%] [4:N/A] [N/A:N/A] % Reduction in Volume: 66.50% [4:N/A] [N/A:N/A] Starting Position 1 3 (o'clock): Ending Position 1 [1:9] (o'clock): Maximum Distance 1 [  1:0.7] (cm): Undermining: [1:Yes] [4:No] [N/A:N/A] Classification: [1:Grade 2] [4:Grade 2] [N/A:N/A] Exudate Amount: [1:Small] [4:Medium] [N/A:N/A] Exudate Type: [1:Serosanguineous] [4:Serosanguineous] [N/A:N/A] Exudate Color: [1:red, brown] [4:red, brown] [N/A:N/A] Wound Margin: [1:Thickened] [4:Flat and Intact] [N/A:N/A] Granulation Amount: [1:Large (67-100%)] [4:Large (67-100%)] [N/A:N/A] Granulation Quality: [1:Red] [4:Pink] [N/A:N/A] Necrotic Amount: [1:None Present (0%)] [4:Small (1-33%)] [N/A:N/A] Exposed Structures: [1:Fat Layer (Subcutaneous Fat Layer (Subcutaneous N/A Tissue) Exposed: Yes Fascia: No Tendon: No Muscle: No Joint: No Bone: No] [4:Tissue) Exposed: Yes Fascia: No Tendon: No Muscle: No Joint: No Bone: No] Epithelialization: [1:Medium (34-66%)] [4:None] [N/A:N/A] Debridement: [1:Debridement - Excisional] [4:Debridement - Excisional] [N/A:N/A] Pre-procedure [1:13:19] [4:13:19]  [N/A:N/A] Verification/Time Out Taken: Tissue Debrided: [1:Callus, Subcutaneous] [4:Callus, Subcutaneous] [N/A:N/A] Level: [1:Skin/Subcutaneous Tissue] [4:Skin/Subcutaneous Tissue] [N/A:N/A] Debridement Area (sq cm):0.2 [4:0.4] [N/A:N/A] Instrument: [1:Curette] [4:Curette] [N/A:N/A] Bleeding: [1:Minimum] [4:Minimum] [N/A:N/A] Hemostasis Achieved: [1:Pressure] [4:Pressure] [N/A:N/A] Procedural Pain: [1:0] [4:0] [N/A:N/A] Post Procedural Pain: [1:0] [4:0] [N/A:N/A] Debridement Treatment Procedure was tolerated [4:Procedure was tolerated] [N/A:N/A] Response: [1:well] [4:well] Post Debridement [1:0.5x0.4x0.5] [4:0.5x0.8x0.1] [N/A:N/A] Measurements L x W x D (cm) Post Debridement [1:0.079] [4:0.031] [N/A:N/A] Volume: (cm) Assessment Notes: [1:areas of purple/maroon] [4:N/A Debridement] [N/A:N/A N/A] Treatment Notes Electronic Signature(s) Signed: 10/15/2019 5:33:33 PM By: Zandra Abts RN, BSN Signed: 10/15/2019 5:40:04 PM By: Baltazar Najjar MD Entered By: Baltazar Najjar on 10/15/2019 13:31:10 -------------------------------------------------------------------------------- Multi-Disciplinary Care Plan Details Patient Name: Date of Service: Allen Gregory, Allen Gregory 10/15/2019 1:00 PM Medical Record [id number] Patient Account Number: [id number] Date of Birth/Sex: Treating RN: 1960/11/26 (59 y.o. [name] Primary Care Bexley Laubach: PATIENT, NO Other Clinician: Referring Kiah Vanalstine: Treating Marqui Formby/Extender:Robson, [name] in Treatment: 14 Active Inactive Wound/Skin Impairment Nursing Diagnoses: Impaired tissue integrity Knowledge deficit related to ulceration/compromised skin integrity Goals: Patient/caregiver will verbalize understanding of skin care regimen Date Initiated: 07/09/2019 Target Resolution Date: 11/02/2019 Goal Status: Active Interventions: Assess patient/caregiver ability to obtain necessary supplies Assess patient/caregiver ability to perform ulcer/skin  care regimen upon admission and as needed Assess ulceration(s) every visit Provide education on ulcer and skin care Notes: Electronic Signature(s) Signed: 10/15/2019 5:33:33 PM By: Zandra Abts RN, BSN Entered By: Zandra Abts on 10/15/2019 12:58:41 -------------------------------------------------------------------------------- Pain Assessment Details Patient Name: Date of Service: Allen Gregory, Allen Gregory 10/15/2019 1:00 PM Medical Record [id number] Patient Account Number: [id number] Date of Birth/Sex: Treating RN: Oct 13, 1960 (59 y.o. [name] Primary Care Victory Dresden: PATIENT, NO Other Clinician: Referring Geisha Abernathy: Treating Karson Reede/Extender:Robson, [name] in Treatment: 14 Active Problems Location of Pain Severity and Description of Pain Patient Has Paino No Site Locations Pain Management and Medication Current Pain Management: Electronic Signature(s) Signed: 10/16/2019 5:26:58 PM By: Cherylin Mylar Entered By: Cherylin Mylar on 10/15/2019 12:59:44 -------------------------------------------------------------------------------- Patient/Caregiver Education Details Patient Name: Date of Service: Allen Gregory, Allen Gregory 4/26/2021andnbsp1:00 PM Medical Record [id number] Patient Account Number: [id number] Date of Birth/Gender: 1960-08-25 (58 y.o. M) Treating RN: Zandra Abts Primary Care Physician: PATIENT, NO Other Clinician: Referring Physician: Treating Physician/Extender:Robson, [name] in Treatment: 14 Education Assessment Education Provided To: Patient Education Topics Provided Wound/Skin Impairment: Methods: Explain/Verbal Responses: State content correctly Electronic Signature(s) Signed: 10/15/2019 5:33:33 PM By: Zandra Abts RN, BSN Entered By: Zandra Abts on 10/15/2019 12:58:51 -------------------------------------------------------------------------------- Wound Assessment Details Patient Name: Date of Service: Allen Gregory, Allen Gregory  10/15/2019 1:00 PM Medical Record [id number] Patient Account Number: [id number] Date of Birth/Sex: Treating RN: Sep 15, 1960 (59 y.o. [name] Primary Care Sheron Tallman: PATIENT, NO Other Clinician: Referring Adylin Hankey: Treating Jabron Weese/Extender:Robson, [name] in Treatment: 14 Wound Status Wound Number: 1 Primary Diabetic Wound/Ulcer of the Lower Extremity Wound Location: Right, Plantar Toe Great Etiology: Wounding Event: Gradually Appeared  Wound Open Date Acquired: 01/20/2019 Status: Weeks Of Treatment:14 Comorbid Pneumothorax, Deep Vein Thrombosis, Type Clustered Wound: No History: II Diabetes, Neuropathy Photos Photo Uploaded By: Mikeal Hawthorne on 10/16/2019 14:44:55 Wound Measurements Length: (cm) 0.5 Width: (cm) 0.4 Depth: (cm) 0.5 Area: (cm) 0.157 Volume: (cm) 0.079 % Reduction in Area: 66.7% % Reduction in Volume: 66.5% Epithelialization: Medium (34-66%) Tunneling: No Undermining: Yes Starting Position (o'clock): 3 Ending Position (o'clock): 9 Maximum Distance: (cm) 0.7 Wound Description Classification: Grade 2 Foul Odor A Wound Margin: Thickened Slough/Fibr Exudate Amount: Small Exudate Type: Serosanguineous Exudate Color: red, brown Wound Bed Granulation Amount: Large (67-100%) Granulation Quality: Red Fascia Expo Necrotic Amount: None Present (0%) Fat Layer ( Tendon Expo Muscle Expo Joint Expos Bone Expose fter Cleansing: No ino No Exposed Structure sed: No Subcutaneous Tissue) Exposed: Yes sed: No sed: No ed: No d: No Assessment Notes areas of purple/maroon Treatment Notes Wound #1 (Right, Plantar Toe Great) 1. Cleanse With Wound Cleanser 3. Primary Dressing Applied Collegen AG Hydrogel or K-Y Jelly 4. Secondary Dressing Dry Gauze Roll Gauze Foam 5. Secured With Medipore tape 7. Footwear/Offloading device applied Wedge shoe Notes foam donut as secondary dressings. Electronic Signature(s) Signed: 10/16/2019 5:26:58  PM By: Kela Millin Entered By: Kela Millin on 10/15/2019 13:05:21 -------------------------------------------------------------------------------- Wound Assessment Details Patient Name: Date of Service: Allen Gregory, Allen Gregory 10/15/2019 1:00 PM Medical Record [id number] Patient Account Number: [id number] Date of Birth/Sex: Treating RN: [birth year] (59 y.o. [name] Primary Care Reyce Lubeck: PATIENT, NO Other Clinician: Referring Rachel Samples: Treating Izyan Ezzell/Extender:Robson, Esperanza Richters in Treatment: 14 Wound Status Wound Number: 4 Primary Diabetic Wound/Ulcer of the Lower Extremity Wound Location: Right, Medial Toe Great Etiology: Wounding Event: Blister Wound Open Date Acquired: 10/15/2019 Status: Weeks Of Treatment:0 Comorbid Pneumothorax, Deep Vein Thrombosis, Type Clustered Wound: No History: II Diabetes, Neuropathy Photos Photo Uploaded By: Mikeal Hawthorne on 10/16/2019 14:44:55 Wound Measurements Length: (cm) 0.5 % Reduction Width: (cm) 0.8 % Reduction Depth: (cm) 0.1 Epithelializ Area: (cm) 0.314 Tunneling: Volume: (cm) 0.031 Undermining Wound Description Classification: Grade 2 Foul Odor A Wound Margin: Flat and Intact Slough/Fibr Exudate Amount: Medium Exudate Type: Serosanguineous Exudate Color: red, brown Wound Bed Granulation Amount: Large (67-100%) Granulation Quality: Pink Fascia Expo Necrotic Amount: Small (1-33%) Fat Layer ( Necrotic Quality: Adherent Slough Tendon Expo Muscle Expo Joint Expos Bone Expose fter Cleansing: No ino Yes Exposed Structure sed: No Subcutaneous Tissue) Exposed: Yes sed: No sed: No ed: No d: No in Area: in Volume: ation: None No : No Treatment Notes Wound #4 (Right, Medial Toe Great) 1. Cleanse With Wound Cleanser 3. Primary Dressing Applied Collegen AG Hydrogel or K-Y Jelly 4. Secondary Dressing Dry Gauze Roll Gauze Foam 5. Secured With Medipore tape 7. Footwear/Offloading device  applied Wedge shoe Notes foam donut as secondary dressings. Electronic Signature(s) Signed: 10/15/2019 5:33:33 PM By: Levan Hurst RN, BSN Entered By: Levan Hurst on 10/15/2019 13:21:28 -------------------------------------------------------------------------------- [place] Details Patient Name: Date of Service: BISHOY, CUPP 10/15/2019 1:00 PM Medical Record [id number] Patient Account Number: [id number] Date of Birth/Sex: Treating RN: Aug 21, 1960 (59 y.o. Marvis Repress Primary Care Hiromi Knodel: PATIENT, NO Other Clinician: Referring Darvin Dials: Treating Goble Fudala/Extender:Robson, Esperanza Richters in Treatment: 14 Vital Signs Time Taken: 12:55 Temperature (F): 98.5 Height (in): 78 Pulse (bpm): 84 Weight (lbs): 285 Respiratory Rate (breaths/min): 18 Body Mass Index (BMI): 32.9 Blood Pressure (mmHg): 149/66 Reference Range: 80 - 120 mg / dl Electronic Signature(s) Signed: 10/16/2019 5:26:58 PM By: Kela Millin Entered By: Kela Millin on 10/15/2019 12:59:36

## 2019-10-22 ENCOUNTER — Encounter (HOSPITAL_BASED_OUTPATIENT_CLINIC_OR_DEPARTMENT_OTHER): Payer: 59 | Attending: Internal Medicine | Admitting: Internal Medicine

## 2019-10-22 DIAGNOSIS — M21371 Foot drop, right foot: Secondary | ICD-10-CM | POA: Diagnosis not present

## 2019-10-22 DIAGNOSIS — E11621 Type 2 diabetes mellitus with foot ulcer: Secondary | ICD-10-CM | POA: Insufficient documentation

## 2019-10-22 DIAGNOSIS — E1151 Type 2 diabetes mellitus with diabetic peripheral angiopathy without gangrene: Secondary | ICD-10-CM | POA: Insufficient documentation

## 2019-10-22 DIAGNOSIS — E1142 Type 2 diabetes mellitus with diabetic polyneuropathy: Secondary | ICD-10-CM | POA: Diagnosis not present

## 2019-10-22 DIAGNOSIS — Z7901 Long term (current) use of anticoagulants: Secondary | ICD-10-CM | POA: Insufficient documentation

## 2019-10-22 DIAGNOSIS — Z96641 Presence of right artificial hip joint: Secondary | ICD-10-CM | POA: Insufficient documentation

## 2019-10-22 DIAGNOSIS — L03031 Cellulitis of right toe: Secondary | ICD-10-CM | POA: Insufficient documentation

## 2019-10-22 DIAGNOSIS — Z86711 Personal history of pulmonary embolism: Secondary | ICD-10-CM | POA: Insufficient documentation

## 2019-10-22 DIAGNOSIS — L97512 Non-pressure chronic ulcer of other part of right foot with fat layer exposed: Secondary | ICD-10-CM | POA: Diagnosis not present

## 2019-10-22 NOTE — Progress Notes (Signed)
ENRICO, EADDY ([id number]) Visit Report for 10/22/2019 HPI Details Patient Name: Date of Service: Allen Gregory NA RD 10/22/2019 8:00 A M Medical Record Number: [id number] Patient Account Number: [id number] Date of Birth/Sex: Treating RN: [birth year] (59 y.o. Allen Gregory Primary Care Provider: PA Haig Prophet, NO Other Clinician: Referring Provider: Treating Provider/Extender: Cheree Ditto in Treatment: 15 History of Present Illness HPI Description: ADMISSION 07/09/2019 This is a pleasant 59 year old man who referred himself here for second opinion sign a new area on the right plantar first toe and the dorsal part of his right fifth toe. He says he had these in August when he had removed callus from the first toe and then played pool volleyball for about 4 hours. He thinks the bottom of the pool simply caused an abrasion of the toe. The story sounds accurate. He has been going to podiatry for the last several months. Me a picture on his phone from August and the wound is gotten considerably smaller. He states that it started doing well recently when they started collagen. He has been using a surgical shoe to offload. 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 He had a brace for a long time but now he simply lifts his foot higher to clear but states that most people would not even notice that he had any disability. He is a type 58 diabetes Badik but he has diet-controlled he has lost weight by watching calories. 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 07/16/2019; right fifth toe is closed. Right first toe is smaller. He is using a surgical shoe but he tells me is fairly religious about using his  scooter at home. Hopefully this will give him enough offloading to close this. 2/1; right fifth toe dorsally remains closed. Right first toe still not a lot of improvement. Although superficially it looks smaller there is undermining laterally from 6-6 of at least 3 to 4 mm. Also worrisome is that the granulation does not look that healthy. We have been using silver collagen He had his foot x-rayed by his podiatrist but I do not have access to this. I am going to x-ray the right great toe again. Otherwise I am thinking he is going to require a total contact cast probably next week and I discussed this with him today. 2/9; his x-ray of the right foot did not show plain x-ray evidence of osteomyelitis. We have been using silver alginate the wound is not improved looks static. He is going to get a total contact cast today He comes in today telling me that before he left podiatry before he came here he had 58 noninvasive arterial studies that were done by a mobile service and the podiatrist office. They are now trying to arrange I think an angiogram however this would be in [facility]. He asked my opinion on this and I told him that angiograms are done by several different doctor groups locally and if he is from [place] see a reason to go out of [facility] for any procedure he might need. We will see if we can get the actual angiogram report and I will refer him as needed to either vein  and vascular or interventional cardiology 2/12; back for his first obligatory total contact cast change. I did get his arterial studies from in [facility]. Biphasic waveforms ABI in the right of 1.16 on the left at 1.14 I am not really sure what was so concerning about this [place] I will simply repeat these studies along with TBI's locally. There should not be any need for him to go out of town to have this evaluation. I am not sure  that there is a clinical need 2/16; once again the total contact cast was too loose he has multiple skin excoriations where things were rubbing. He claims to not be on this all that much and is working from home but between the size of his foot the wasting in the distal part of his lower leg and large calfs we just cannot mold the cast in to fit his leg properly.-Changed him to a forefoot off loader but with his foot drop that may not be possible. 2/23; we put him out in a surgical shoe last week as we did not have a forefoot off loader. We have this today. We are using silver alginate. He did not tolerate a total contact cast 3/1; he is in a forefoot offloading boot. Using silver collagen as of last week. No major change 3/8; he is using a forefoot offloading boot. Silver collagen over the last 2 weeks. Perhaps some reduction in depth. Moreover surface looks healthy 3/15; he is a forefoot offloading boot. Silver collagen over the last 3 weeks. There has been reduction in depth. Much less circumferential callus and thick subcutaneous tissue. 3/22; his original wound on the plantar right great toe is somewhat improved. However he arrives in clinic today with a completely denuded ulcer on the tip of the toe distal to the original wound. More concerning is there is loss of surface epithelium over a wide area around this even dorsally on the toe. He states that he last wrapped the area on Saturday. He was up at [facility] walking this weekend otherwise he had not done anything different. Noticeable for the fact that his forefoot offloading shoe was not in good condition fact that split open this could have had something to do with this but I have no doubt that there is also some degree of infection/cellulitis 3/29; patient came to the clinic last week with a new wound on the tip of the right great toe. This looked infected there was epithelial loss extending to the base of the first toe dorsally on the  right. I gave him empiric doxycycline. Culture I did showed methicillin sensitive staph aureus Proteus and group B strep. I gave him doxycycline which is not a reliable cover of strep or Proteus. I am going to give him a course of Augmentin today. He has not been systemically unwell. 4/5; original wound on the plantar right great toe. 2 weeks ago had a new wound on the tip of the right great toe with extensive cellulitis. Culture I did at that time ultimately showed methicillin sensitive staph aureus Proteus and group B strep. He received some doxycycline I changed him to Augmentin last week he has 3 more days. He is complaining of some pruritus but no rash. X-ray; showed no bony abnormalities in the right great toe. 4/12; no evidence of infection. Right great toe now working on the original wound. The new wound at the tip of the  toe is callused but no open area. 4/19; wound measures about the same. Roughly 3 mm of depth. He has a lot of callus on the tip of the toe where the subsequent wound was however there is no open wound here. Some callus around the circumference of the wound we have been working on 4/26; the wound that he originally had on the plantar aspect about the same. Medially he had a new wound which he says started as an "blood blister". Obviously this has unroofed if they were true. We have been using silver alginate 5/3; the wound on the plantar toe measures smaller although he still has thick callus and skin. It is difficult to tell whether this is going to close or not. The blister that we found medially last week is also close over but not necessarily with a vibrant lady healthy looking surface Electronic Signature(s) Signed: 10/22/2019 5:33:20 PM By: Baltazar Najjar MD Entered By: Baltazar Najjar on 10/22/2019 08:55:10 -------------------------------------------------------------------------------- Physical Exam Details Patient Name: Date of Service: Allen Gregory NA RD 10/22/2019  8:00 A M Medical Record Number: [id number] Patient Account Number: [id number] Date of Birth/Sex: Treating RN: 10/30/1960 (59 y.o. Allen Gregory Primary Care Provider: [name], NO Other Clinician: Referring Provider: Treating Provider/Extender: Valentino Saxon in Treatment: 15 Constitutional Sitting or standing Blood Pressure is within target range for patient.. Pulse regular and within target range for patient.Marland Kitchen Respirations regular, non-labored and within target range.. Temperature is normal and within the target range for the patient.Marland Kitchen Appears in no distress. Cardiovascular Pedal pulses palpable. Notes Wound exam; the area on the tip of the toe still has callus and I am not sure about the viability of the surface although I elected not to debride this this week. The new area medially is also closed. No debridement here either. There is no evidence of infection in either area Electronic Signature(s) Signed: 10/22/2019 5:33:20 PM By: Baltazar Najjar MD Entered By: Baltazar Najjar on 10/22/2019 08:56:34 -------------------------------------------------------------------------------- Physician Orders Details Patient Name: Date of Service: Allen Gregory NA RD 10/22/2019 8:00 A M Medical Record Number: [id number] Patient Account Number: [id number] Date of Birth/Sex: Treating RN: [birth year] (59 y.o. 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 Dressing Change Frequency Wound #1  Right,Plantar T Great oe Change Dressing every other day. Wound Cleansing May shower and wash wound with soap and water. Primary Wound Dressing Wound #1 Right,Plantar T Great oe Silver Collagen - moisten with hydrogel or KY jelly Secondary Dressing Wound #1 Right,Plantar T Great oe Foam - foam donut Kerlix/Rolled Gauze - secure with tape Dry Gauze Off-Loading Wedge shoe to: - right foot Electronic Signature(s) Signed: 10/22/2019 5:27:06 PM By: Zandra Abts RN, BSN Signed: 10/22/2019 5:33:20 PM By: Baltazar Najjar MD Entered By: Zandra Abts on 10/22/2019 08:42:49 -------------------------------------------------------------------------------- Problem List Details Patient Name: Date of Service: Allen Gregory NA RD 10/22/2019 8:00 A M Medical Record Number: [id number] Patient Account Number: [id number] Date of Birth/Sex: Treating RN: 04-May-1961 (59 y.o. Allen Gregory Primary Care Provider: [name], NO Other Clinician: Referring Provider: Treating  Provider/Extender: Valentino Saxon in Treatment: 15 Active Problems ICD-10 Encounter Code Description Active Date MDM Diagnosis E11.621 Type 2 diabetes mellitus with foot ulcer 07/09/2019 No Yes L97.512 Non-pressure chronic ulcer of other part of right foot with fat layer exposed 07/09/2019 No Yes E11.42 Type 2 diabetes mellitus with diabetic polyneuropathy 07/09/2019 No Yes M21.371 Foot drop, right foot 07/09/2019 No Yes L03.031 Cellulitis of right toe 09/10/2019 No Yes Inactive Problems ICD-10 Code Description Active Date Inactive Date E11.51 Type 2 diabetes mellitus with diabetic peripheral angiopathy without gangrene 07/31/2019 07/31/2019 Resolved Problems Electronic Signature(s) Signed: 10/22/2019 5:33:20 PM By: Baltazar Najjar MD Entered By: Baltazar Najjar on 10/22/2019 08:54:06 -------------------------------------------------------------------------------- Progress Note Details Patient Name: Date of Service: Allen Gregory NA RD 10/22/2019 8:00 A M Medical Record Number: [id number] Patient Account Number: [id number] Date of Birth/Sex: Treating RN: [birth year] (59 y.o. Allen Gregory Primary Care Provider: [name], NO Other Clinician: Referring Provider: Treating Provider/Extender: Valentino Saxon in Treatment: 15 Subjective History of Present Illness (HPI) ADMISSION 07/09/2019 This is a pleasant 59 year old man who referred himself here for second opinion sign a new area on the right plantar first toe and the dorsal part of his right fifth toe. He says he had these in August when he had removed callus from the first toe and then played pool volleyball for about 4 hours. He thinks the bottom of the pool simply caused an abrasion of the toe. The story sounds accurate. He has been going to podiatry for the last several months. Me a picture on his phone from August and the wound is gotten considerably smaller. He states that it started doing well recently when they started collagen. He has been using a surgical shoe to offload. 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 He had a brace for a long time but now he simply lifts his foot higher to clear but states that most people would not even notice that he had any disability. He is a type 58 diabetes Badik but he has diet-controlled he has lost weight by watching calories. 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 07/16/2019; right fifth toe is closed. Right first toe is smaller. He is using a surgical shoe but he tells me is fairly religious about using his scooter at home. Hopefully this will give him enough offloading to close this. 2/1; right fifth toe dorsally  remains closed. Right first toe still not a lot of improvement. Although superficially it looks smaller there is undermining laterally from 6-6 of at least 3 to 4 mm. Also worrisome is that the granulation does not look that healthy. We have been using silver collagen He had his foot x-rayed by his podiatrist but I do not have access to this. I am going to x-ray the right great toe again. Otherwise I am thinking he is going to require a total contact cast probably next week and I discussed this with him today. 2/9; his x-ray of the right foot did not show plain x-ray evidence of osteomyelitis. We have been using silver alginate the wound is not improved looks static. He is going to get a total contact cast today He comes in today  telling me that before he left podiatry before he came here he had 58 noninvasive arterial studies that were done by a mobile service and the podiatrist office. They are now trying to arrange I think an angiogram however this would be in [facility]. He asked my opinion on this and I told him that angiograms are done by several different doctor groups locally and if he is from [place] I cannot see a reason to go out of [facility] for any procedure he might need. We will see if we can get the actual angiogram report and I will refer him as needed to either vein and vascular or interventional cardiology 2/12; back for his first obligatory total contact cast change. I did get his arterial studies from in [facility]. Biphasic waveforms ABI in the right of 1.16 on the left at 1.14 I am not really sure what was so concerning about this [place] I will simply repeat these studies along with TBI's locally. There should not be any need for him to go out of town to have this evaluation. I am not sure that there is a clinical need 2/16; once again the total contact cast was too loose he has multiple skin  excoriations where things were rubbing. He claims to not be on this all that much and is working from home but between the size of his foot the wasting in the distal part of his lower leg and large calfs we just cannot mold the cast in to fit his leg properly.-Changed him to a forefoot off loader but with his foot drop that may not be possible. 2/23; we put him out in a surgical shoe last week as we did not have a forefoot off loader. We have this today. We are using silver alginate. He did not tolerate a total contact cast 3/1; he is in a forefoot offloading boot. Using silver collagen as of last week. No major change 3/8; he is using a forefoot offloading boot. Silver collagen over the last 2 weeks. Perhaps some reduction in depth. Moreover surface looks healthy 3/15; he is a forefoot offloading boot. Silver collagen over the last 3 weeks. There has been reduction in depth. Much less circumferential callus and thick subcutaneous tissue. 3/22; his original wound on the plantar right great toe is somewhat improved. However he arrives in clinic today with a completely denuded ulcer on the tip of the toe distal to the original wound. More concerning is there is loss of surface epithelium over a wide area around this even dorsally on the toe. He states that he last wrapped the area on Saturday. He was up at [facility] walking this weekend otherwise he had not done anything different. Noticeable for the fact that his forefoot offloading shoe was not in good condition fact that split open this could have had something to do with this but I have no doubt that there is also some degree of infection/cellulitis 3/29; patient came to the clinic last week with a new wound on the tip of the right great toe. This looked infected there was epithelial loss extending to the base of the first toe dorsally on the right. I gave him empiric doxycycline. Culture I did showed methicillin sensitive staph aureus Proteus and  group B strep. I gave him doxycycline which is not a reliable cover of strep or Proteus. I am going to give him a course of  Augmentin today. He has not been systemically unwell. 4/5; original wound on the plantar right great toe. 2 weeks ago had a new wound on the tip of the right great toe with extensive cellulitis. Culture I did at that time ultimately showed methicillin sensitive staph aureus Proteus and group B strep. He received some doxycycline I changed him to Augmentin last week he has 3 more days. He is complaining of some pruritus but no rash. X-ray; showed no bony abnormalities in the right great toe. 4/12; no evidence of infection. Right great toe now working on the original wound. The new wound at the tip of the toe is callused but no open area. 4/19; wound measures about the same. Roughly 3 mm of depth. He has a lot of callus on the tip of the toe where the subsequent wound was however there is no open wound here. Some callus around the circumference of the wound we have been working on 4/26; the wound that he originally had on the plantar aspect about the same. Medially he had a new wound which he says started as an "blood blister". Obviously this has unroofed if they were true. We have been using silver alginate 5/3; the wound on the plantar toe measures smaller although he still has thick callus and skin. It is difficult to tell whether this is going to close or not. The blister that we found medially last week is also close over but not necessarily with a vibrant lady healthy looking surface Objective Constitutional Sitting or standing Blood Pressure is within target range for patient.. Pulse regular and within target range for patient.Marland Kitchen. Respirations regular, non-labored and within target range.. Temperature is normal and within the target range for the patient.Marland Kitchen. Appears in no distress. Vitals Time Taken: 7:51 AM, Height: 78 in, Weight: 285 lbs, BMI: 32.9, Temperature: 98.4 F,  Pulse: 83 bpm, Respiratory Rate: 18 breaths/min, Blood Pressure: 138/56 mmHg. Cardiovascular Pedal pulses palpable. General Notes: Wound exam; the area on the tip of the toe still has callus and I am not sure about the viability of the surface although I elected not to debride this this week. ooThe new area medially is also closed. No debridement here either. ooThere is no evidence of infection in either area Integumentary (Hair, Skin) Wound #1 status is Open. Original cause of wound was Gradually Appeared. The wound is located on the [employer],Plantar T Great. The wound measures 0.2cm oe length x 0.3cm width x 0.4cm depth; 0.047cm^2 area and 0.019cm^3 volume. There is Fat Layer (Subcutaneous Tissue) Exposed exposed. There is no tunneling or undermining noted. There is a medium amount of serosanguineous drainage noted. The wound margin is thickened. There is large (67-100%) red granulation within the wound bed. There is no necrotic tissue within the wound bed. Wound #4 status is Healed - Epithelialized. Original cause of wound was Blister. The wound is located on the Right,Medial T Great. The wound measures 0cm oe length x 0cm width x 0cm depth; 0cm^2 area and 0cm^3 volume. There is no tunneling or undermining noted. There is a none present amount of drainage noted. The wound margin is flat and intact. There is no granulation within the wound bed. There is no necrotic tissue within the wound bed. 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 Dressing Change Frequency: Wound #1 Right,Plantar T Great: oe Change Dressing every other day. Wound Cleansing: May shower and wash wound with soap and water. Primary Wound  Dressing: Wound #1 Right,Plantar T Great: oe Silver Collagen - moisten with hydrogel or KY jelly Secondary Dressing: Wound #1 Right,Plantar T Great: oe Foam - foam donut Kerlix/Rolled Gauze - secure with tape Dry Gauze Off-Loading: Wedge shoe to: - right foot #1 still using silver collagen 2. I think this is largely an offloading issue. 3. I did discuss with him what we are going to do in the eventuality that these actually close over. He wears new balance shoes. Size 14 makes it difficult for him to obtain off-the-shelf insoles etc. 4. It looks as though his original wound is contracting I did not debride this today but will look at where we are next week in terms of progress Electronic Signature(s) Signed: 10/22/2019 5:33:20 PM By: Baltazar Najjar MD Entered By: Baltazar Najjar on 10/22/2019 08:57:49 -------------------------------------------------------------------------------- SuperBill Details Patient Name: Date of Service: Allen Gregory NA RD 10/22/2019 Medical Record Number: [id number] Patient Account Number: [id number] Date of Birth/Sex: Treating RN: Mar 14, 1961 (59 y.o. 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

## 2019-10-22 NOTE — Progress Notes (Signed)
SHADRACH, BARTUNEK ([id number]) Visit Report for 10/22/2019 Arrival Information Details Patient Name: Date of Service: Allen Gregory RD 10/22/2019 8:00 A M Medical Record Number: [id number] Patient Account Number: [id number] Date of Birth/Sex: Treating RN: [birth year] (59 y.o. Allen Gregory) Carlene Coria Primary Care Nicco Reaume: PA Haig Prophet, [place] Other Clinician: Referring Winn Muehl: Treating Chantalle Defilippo/Extender: Cheree Ditto in Treatment: 15 Visit Information History Since Last Visit All ordered tests and consults were completed: No Patient Arrived: Ambulatory Added or deleted any medications: No Arrival Time: 07:50 Any new allergies or adverse reactions: No Accompanied By: self Had a fall or experienced change in No Transfer Assistance: None activities of daily living that may affect Patient Identification Verified: Yes risk of falls: Secondary Verification Process Completed: Yes Signs or symptoms of abuse/neglect since last visito No Patient Requires Transmission-Based Precautions: No Hospitalized since last visit: No Patient Has Alerts: Yes Implantable device outside of the clinic excluding No Patient Alerts: Patient on Blood Thinner cellular tissue based products placed in the center since last visit: Has Dressing in Place as Prescribed: Yes Pain Present Now: No Electronic Signature(s) Signed: 10/22/2019 5:02:18 PM By: Carlene Coria RN Entered By: Carlene Coria on 10/22/2019 07:51:35 -------------------------------------------------------------------------------- Clinic Level of Care Assessment Details Patient Name: Date of Service: Allen Gregory RD 10/22/2019 8:00 A M Medical Record Number: [id number] Patient Account Number: [id number] Date of Birth/Sex: Treating RN: 1961-01-03 (59 y.o. Allen Gregory Primary Care Allen Gregory: [name], NO Other Clinician: Referring Zahria Ding: Treating Allen Gregory/Extender: Cheree Ditto in Treatment: 15 Clinic Level of Care Assessment Items TOOL 4  Quantity Score X- 1 0 Use when only an EandM is performed on FOLLOW-UP visit ASSESSMENTS - Nursing Assessment / Reassessment X- 1 10 Reassessment of Co-morbidities (includes updates in patient status) X- 1 5 Reassessment of Adherence to Treatment Plan ASSESSMENTS - Wound and Skin A ssessment / Reassessment X - Simple Wound Assessment / Reassessment - one wound 1 5 []  - 0 Complex Wound Assessment / Reassessment - multiple wounds []  - 0 Dermatologic / Skin Assessment (not related to wound area) ASSESSMENTS - Focused Assessment []  - 0 Circumferential Edema Measurements - multi extremities []  - 0 Nutritional Assessment / Counseling / Intervention X- 1 5 Lower Extremity Assessment (monofilament, tuning fork, pulses) []  - 0 Peripheral Arterial Disease Assessment (using hand held doppler) ASSESSMENTS - Ostomy and/or Continence Assessment and Care []  - 0 Incontinence Assessment and Management []  - 0 Ostomy Care Assessment and Management (repouching, etc.) PROCESS - Coordination of Care X - Simple Patient / Family Education for ongoing care 1 15 []  - 0 Complex (extensive) Patient / Family Education for ongoing care X- 1 10 Staff obtains Programmer, systems, Records, T Results / Process Orders est []  - 0 Staff telephones HHA, Nursing Homes / Clarify orders / etc []  - 0 Routine Transfer to another Facility (non-emergent condition) []  - 0 Routine Hospital Admission (non-emergent condition) []  - 0 New Admissions / Biomedical engineer / Ordering NPWT Apligraf, etc. , []  - 0 Emergency Hospital Admission (emergent condition) X- 1 10 Simple Discharge Coordination []  - 0 Complex (extensive) Discharge Coordination PROCESS - Special Needs []  - 0 Pediatric / Minor Patient Management []  - 0 Isolation Patient Management []  - 0 Hearing / Language / Visual special needs []  - 0 Assessment of Community assistance (transportation, D/C planning, etc.) []  - 0 Additional assistance / Altered  mentation []  - 0 Support Surface(s) Assessment (bed, cushion, seat, etc.) INTERVENTIONS - Wound Cleansing / Measurement X - Simple  Wound Cleansing - one wound 1 5 []  - 0 Complex Wound Cleansing - multiple wounds X- 1 5 Wound Imaging (photographs - any number of wounds) []  - 0 Wound Tracing (instead of photographs) X- 1 5 Simple Wound Measurement - one wound []  - 0 Complex Wound Measurement - multiple wounds INTERVENTIONS - Wound Dressings X - Small Wound Dressing one or multiple wounds 1 10 []  - 0 Medium Wound Dressing one or multiple wounds []  - 0 Large Wound Dressing one or multiple wounds X- 1 5 Application of Medications - topical []  - 0 Application of Medications - injection INTERVENTIONS - Miscellaneous []  - 0 External ear exam []  - 0 Specimen Collection (cultures, biopsies, blood, body fluids, etc.) []  - 0 Specimen(s) / Culture(s) sent or taken to Lab for analysis []  - 0 Patient Transfer (multiple staff / / Similar devices) []  - 0 Simple Staple / Suture removal (25 or less) []  - 0 Complex Staple / Suture removal (26 or more) []  - 0 Hypo / Hyperglycemic Management (close monitor of Blood Glucose) []  - 0 Ankle / Brachial Index (ABI) - do not check if billed separately X- 1 5 Vital Signs Has the patient been seen at the hospital within the last three years: Yes Total Score: 95 Level Of Care: New/Established - Level 3 Electronic Signature(s) Signed: 10/22/2019 5:27:06 PM By: RN, BSN Entered By: on 10/22/2019 08:44:08 -------------------------------------------------------------------------------- Encounter Discharge Information Details Patient Name: Date of Service: , NA RD 10/22/2019 8:00 A M Medical Record Number: Patient Account Number: Date of Birth/Sex: Treating RN: 18-May-1961 (59 y.o. Primary Care [place] E[place]: [place] , [place] Other Clinician: Referring  Tryniti Laatsch: Treating Allen Gregory/Extender: in Treatment: 15 Encounter Discharge Information Items Discharge Condition: Stable Ambulatory Status: Ambulatory Discharge Destination: Home Transportation: Private Auto Accompanied By: self Schedule Follow-up Appointment: Yes Clinical Summary of Care: Patient Declined Electronic Signature(s) Signed: 10/22/2019 4:55:42 PM By: Zandra Abts Entered By: Zandra Abts on 10/22/2019 08:46:56 -------------------------------------------------------------------------------- Lower Extremity Assessment Details Patient Name: Date of Service: Seabron Spates NA RD 10/22/2019 8:00 A M Medical Record Number: 12/22/2019 Patient Account Number: [id number] Date of Birth/Sex: Treating RN: Nov 01, 1960 (59 y.o. 59) [name] Primary Care Jazariah Teall: [name], NO Other Clinician: Referring Basil Blakesley: Treating Cyerra Yim/Extender: Valentino Saxon in Treatment: 15 Edema Assessment Assessed: [Left: No] [Right: No] Edema: [Left: N] [Right: o] Calf Left: Right: Point of Measurement: 38 cm From Medial Instep cm 43 cm Ankle Left: Right: Point of Measurement: 10 cm From Medial Instep cm 24 cm Electronic Signature(s) Signed: 10/22/2019 5:02:18 PM By: Cherylin Mylar RN Entered By: Cherylin Mylar on 10/22/2019 08:00:40 -------------------------------------------------------------------------------- Multi Wound Chart Details Patient Name: Date of Service: Delrae Rend, 12/22/2019 NA RD 10/22/2019 8:00 A M Medical Record Number: [id number] Patient Account Number: 04/23/1961 Date of Birth/Sex: Treating RN: 28-Jun-1960 (59 y.o. Yevonne Pax Primary Care Sheri Gatchel: [name], NO Other Clinician: Referring Ruthia Person: Treating Nariya Neumeyer/Extender: Valentino Saxon in Treatment: 15 Vital Signs Height(in): 78 Pulse(bpm): 83 Weight(lbs): 285 Blood Pressure(mmHg): 138/56 Body Mass Index(BMI): 33 Temperature(F): 98.4 Respiratory Rate(breaths/min): 18 Photos:  [1:No Photos Right, Plantar T Great oe] [4:No Photos Right, Medial T Great oe] [N/A:N/A N/A] Wound Location: [1:Gradually Appeared] [4:Blister] [N/A:N/A] Wounding Event: [1:Diabetic Wound/Ulcer of the Lower] [4:Diabetic Wound/Ulcer of the Lower] [N/A:N/A] Primary Etiology: [1:Extremity Pneumothorax, Deep Vein Thrombosis, Pneumothorax, Deep Vein Thrombosis, N/A] [4:Extremity] Comorbid History: [1:Type II Diabetes, Neuropathy 01/20/2019] [4:Type II Diabetes,  Neuropathy 10/15/2019] [N/A:N/A] Date Acquired: [1:15] [4:1] [N/A:N/A] Weeks of Treatment: [1:Open] [4:Healed - Epithelialized] [N/A:N/A] Wound Status: [1:0.2x0.3x0.4] [4:0x0x0] [N/A:N/A] Measurements L x W x D (cm) [1:0.047] [4:0] [N/A:N/A] A (cm) : rea [1:0.019] [4:0] [N/A:N/A] Volume (cm) : [1:90.00%] [4:100.00%] [N/A:N/A] % Reduction in A rea: [1:91.90%] [4:100.00%] [N/A:N/A] % Reduction in Volume: [1:Grade 2] [4:Grade 2] [N/A:N/A] Classification: [1:Medium] [4:None Present] [N/A:N/A] Exudate A mount: [1:Serosanguineous] [4:N/A] [N/A:N/A] Exudate Type: [1:red, brown] [4:N/A] [N/A:N/A] Exudate Color: [1:Thickened] [4:Flat and Intact] [N/A:N/A] Wound Margin: [1:Large (67-100%)] [4:None Present (0%)] [N/A:N/A] Granulation A mount: [1:Red] [4:N/A] [N/A:N/A] Granulation Quality: [1:None Present (0%)] [4:None Present (0%)] [N/A:N/A] Necrotic A mount: [1:Fat Layer (Subcutaneous Tissue)] [4:Fascia: No] [N/A:N/A] Exposed Structures: [1:Exposed: Yes Fascia: No Tendon: No Muscle: No Joint: No Bone: No Medium (34-66%)] [4:Fat Layer (Subcutaneous Tissue) Exposed: No Tendon: No Muscle: No Joint: No Bone: No Large (67-100%)] [N/A:N/A] Treatment Notes Wound #1 (Right, Plantar Toe Great) 1. Cleanse With Wound Cleanser 2. Periwound Care Skin Prep 3. Primary Dressing Applied Collegen AG 4. Secondary Dressing Dry Gauze Roll Gauze Foam 5. Secured With Tape Notes foam donut as secondary dressings. Electronic Signature(s) Signed: 10/22/2019  5:27:06 PM By: Zandra Abts RN, BSN Signed: 10/22/2019 5:33:20 PM By: Baltazar Najjar MD Entered By: Baltazar Najjar on 10/22/2019 08:54:15 -------------------------------------------------------------------------------- Multi-Disciplinary Care Plan Details Patient Name: Date of Service: Delrae Rend NA RD 10/22/2019 8:00 A M Medical Record Number: [id number] Patient Account Number: [id number] Date of Birth/Sex: Treating RN: [birth year] (59 y.o. [name] Primary Care Roddy Bellamy: [name], [place] Other Clinician: Referring Dayanira Giovannetti: Treating Josean Lycan/Extender: Valentino Saxon in Treatment: 15 Active Inactive Wound/Skin Impairment Nursing Diagnoses: Impaired tissue integrity Knowledge deficit related to ulceration/compromised skin integrity Goals: Patient/caregiver will verbalize understanding of skin care regimen Date Initiated: 07/09/2019 Target Resolution Date: 11/02/2019 Goal Status: Active Interventions: Assess patient/caregiver ability to obtain necessary supplies Assess patient/caregiver ability to perform ulcer/skin care regimen upon admission and as needed Assess ulceration(s) every visit Provide education on ulcer and skin care Notes: Electronic Signature(s) Signed: 10/22/2019 5:27:06 PM By: Zandra Abts RN, BSN Entered By: Zandra Abts on 10/22/2019 07:52:13 -------------------------------------------------------------------------------- Pain Assessment Details Patient Name: Date of Service: Delrae Rend NA RD 10/22/2019 8:00 A M Medical Record Number: [id number] Patient Account Number: [id number] Date of Birth/Sex: Treating RN: [birth year] (59 y.o. Allen Gregory) Yevonne Pax Primary Care Namine Beahm: [name], NO Other Clinician: Referring Ethan Kasperski: Treating Husein Guedes/Extender: Valentino Saxon in Treatment: 15 Active Problems Location of Pain Severity and Description of Pain Patient Has Paino No Site Locations Pain Management and Medication Current Pain  Management: Electronic Signature(s) Signed: 10/22/2019 5:02:18 PM By: Yevonne Pax RN Entered By: Yevonne Pax on 10/22/2019 07:52:26 -------------------------------------------------------------------------------- Patient/Caregiver Education Details Patient Name: Date of Service: Delrae Rend NA RD 5/3/2021andnbsp8:00 A M Medical Record Number: [id number] Patient Account Number: [id number] Date of Birth/Gender: Treating RN: November 21, 1960 (59 y.o. [name] Primary Care Physician: [name], NO Other Clinician: Referring Physician: Treating Physician/Extender: Valentino Saxon in Treatment: 15 Education Assessment Education Provided To: Patient Education Topics Provided Wound/Skin Impairment: Methods: Explain/Verbal Responses: State content correctly Electronic Signature(s) Signed: 10/22/2019 5:27:06 PM By: Zandra Abts RN, BSN Entered By: Zandra Abts on 10/22/2019 07:52:22 -------------------------------------------------------------------------------- Wound Assessment Details Patient Name: Date of Service: Delrae Rend NA RD 10/22/2019 8:00 A M Medical Record Number: [id number] Patient Account Number: [id number] Date of Birth/Sex: Treating RN: 1960-09-10 (59 y.o. Allen Gregory) Yevonne Pax Primary Care Mishelle Hassan: [name], NO Other Clinician: Referring Collyns Mcquigg: Treating Preslie Depasquale/Extender:  Valentino Saxon in Treatment: 15 Wound Status Wound Number: 1 Primary Diabetic Wound/Ulcer of the Lower Extremity Etiology: Wound Location: Right, Plantar T Great oe Wound Status: Open Wounding Event: Gradually Appeared Comorbid Pneumothorax, Deep Vein Thrombosis, Type II Diabetes, Date Acquired: 01/20/2019 History: Neuropathy Weeks Of Treatment: 15 Clustered Wound: No Wound Measurements Length: (cm) 0.2 Width: (cm) 0.3 Depth: (cm) 0.4 Area: (cm) 0.047 Volume: (cm) 0.019 % Reduction in Area: 90% % Reduction in Volume: 91.9% Epithelialization: Medium (34-66%) Tunneling:  No Undermining: No Wound Description Classification: Grade 2 Wound Margin: Thickened Exudate Amount: Medium Exudate Type: Serosanguineous Exudate Color: red, brown Foul Odor After Cleansing: No Slough/Fibrino No Wound Bed Granulation Amount: Large (67-100%) Exposed Structure Granulation Quality: Red Fascia Exposed: No Necrotic Amount: None Present (0%) Fat Layer (Subcutaneous Tissue) Exposed: Yes Tendon Exposed: No Muscle Exposed: No Joint Exposed: No Bone Exposed: No Treatment Notes Wound #1 (Right, Plantar Toe Great) 1. Cleanse With Wound Cleanser 2. Periwound Care Skin Prep 3. Primary Dressing Applied Collegen AG 4. Secondary Dressing Dry Gauze Roll Gauze Foam 5. Secured With Tape Notes foam donut as secondary dressings. Electronic Signature(s) Signed: 10/22/2019 5:02:18 PM By: Yevonne Pax RN Signed: 10/22/2019 5:27:06 PM By: Zandra Abts RN, BSN Entered By: Zandra Abts on 10/22/2019 08:44:56 -------------------------------------------------------------------------------- Wound Assessment Details Patient Name: Date of Service: Delrae Rend NA RD 10/22/2019 8:00 A M Medical Record Number: [id number] Patient Account Number: [id number] Date of Birth/Sex: Treating RN: Sep 23, 1960 (59 y.o. Allen Gregory) Yevonne Pax Primary Care Lawsyn Heiler: [name], NO Other Clinician: Referring Godfrey Tritschler: Treating Jonny Longino/Extender: Valentino Saxon in Treatment: 15 Wound Status Wound Number: 4 Primary Diabetic Wound/Ulcer of the Lower Extremity Etiology: Wound Location: Right, Medial T Great oe Wound Status: Healed - Epithelialized Wounding Event: Blister Comorbid Pneumothorax, Deep Vein Thrombosis, Type II Diabetes, Date Acquired: 10/15/2019 History: Neuropathy Weeks Of Treatment: 1 Clustered Wound: No Wound Measurements Length: (cm) Width: (cm) Depth: (cm) Area: (cm) Volume: (cm) 0 % Reduction in Area: 100% 0 % Reduction in Volume: 100% 0 Epithelialization: Large  (67-100%) 0 Tunneling: No 0 Undermining: No Wound Description Classification: Grade 2 Wound Margin: Flat and Intact Exudate Amount: None Present Foul Odor After Cleansing: No Slough/Fibrino No Wound Bed Granulation Amount: None Present (0%) Exposed Structure Necrotic Amount: None Present (0%) Fascia Exposed: No Fat Layer (Subcutaneous Tissue) Exposed: No Tendon Exposed: No Muscle Exposed: No Joint Exposed: No Bone Exposed: No Electronic Signature(s) Signed: 10/22/2019 5:02:18 PM By: Yevonne Pax RN Signed: 10/22/2019 5:27:06 PM By: Zandra Abts RN, BSN Entered By: Zandra Abts on 10/22/2019 08:42:35 -------------------------------------------------------------------------------- Vitals Details Patient Name: Date of Service: Seabron Spates, LEO NA RD 10/22/2019 8:00 A M Medical Record Number: [id number] Patient Account Number: [id number] Date of Birth/Sex: Treating RN: Mar 24, 1961 (59 y.o. Allen Gregory) Yevonne Pax Primary Care Ramie Palladino: [name], NO Other Clinician: Referring Latesia Norrington: Treating Vena Bassinger/Extender: Valentino Saxon in Treatment: 15 Vital Signs Time Taken: 07:51 Temperature (F): 98.4 Height (in): 78 Pulse (bpm): 83 Weight (lbs): 285 Respiratory Rate (breaths/min): 18 Body Mass Index (BMI): 32.9 Blood Pressure (mmHg): 138/56 Reference Range: 80 - 120 mg / dl Electronic Signature(s) Signed: 10/22/2019 5:02:18 PM By: Yevonne Pax RN Entered By: Yevonne Pax on 10/22/2019 07:52:19

## 2019-10-29 ENCOUNTER — Encounter (HOSPITAL_BASED_OUTPATIENT_CLINIC_OR_DEPARTMENT_OTHER): Payer: 59 | Admitting: Internal Medicine

## 2019-10-29 DIAGNOSIS — E11621 Type 2 diabetes mellitus with foot ulcer: Secondary | ICD-10-CM | POA: Diagnosis not present

## 2019-10-30 NOTE — Progress Notes (Signed)
TRIG, MCBRYAR ([id number]) Visit Report for 10/29/2019 Debridement Details Patient Name: Date of Service: Allen Gregory NA Gregory 10/29/2019 8:00 A M Medical Record Number: [id number] Patient Account Number: [id number] Date of Birth/Sex: Treating RN: [birth year] (59 y.o. Allen Gregory Primary Care Provider: [name], NO Other Clinician: Referring Provider: Treating Provider/Extender: Valentino Saxon in Treatment: 16 Debridement Performed for Assessment: Wound #1 Right,Plantar T Great oe Performed By: Physician Maxwell Caul., MD Debridement Type: Debridement Severity of Tissue Pre Debridement: Fat layer exposed Level of Consciousness (Pre-procedure): Awake and Alert Pre-procedure Verification/Time Out Yes - 08:15 Taken: Start Time: 08:15 Pain Control: Lidocaine 5% topical ointment T Area Debrided (L x W): otal 0.2 (cm) x 0.2 (cm) = 0.04 (cm) Tissue and other material debrided: Viable, Non-Viable, Callus, Subcutaneous Level: Skin/Subcutaneous Tissue Debridement Description: Excisional Instrument: Curette Bleeding: Minimum Hemostasis Achieved: Pressure End Time: 08:17 Procedural Pain: 0 Post Procedural Pain: 0 Response to Treatment: Procedure was tolerated well Level of Consciousness (Post- Awake and Alert procedure): Post Debridement Measurements of Total Wound Length: (cm) 0.2 Width: (cm) 0.2 Depth: (cm) 0.2 Volume: (cm) 0.006 Character of Wound/Ulcer Post Debridement: Improved Severity of Tissue Post Debridement: Fat layer exposed Post Procedure Diagnosis Same as Pre-procedure Electronic Signature(s) Signed: 10/30/2019 8:11:21 AM By: Baltazar Najjar MD Signed: 10/30/2019 5:17:28 PM By: Zandra Abts RN, BSN Entered By: Baltazar Najjar on 10/29/2019 08:27:23 -------------------------------------------------------------------------------- HPI Details Patient Name: Date of Service: Allen Gregory NA Gregory 10/29/2019 8:00 A M Medical Record Number: [id number] Patient  Account Number: [id number] Date of Birth/Sex: Treating RN: Oct 06, 1960 (59 y.o. Allen Gregory Primary Care Provider: [name], NO Other Clinician: Referring Provider: Treating Provider/Extender: Valentino Saxon in Treatment: 16 History of Present Illness HPI Description: ADMISSION 07/09/2019 This is a pleasant 59 year old man who referred himself here for second opinion sign a new area on the right plantar first toe and the dorsal part of his right fifth toe. He says he had these in August when he had removed callus from the first toe and then played pool volleyball for about 4 hours. He thinks the bottom of the pool simply caused an abrasion of the toe. The story sounds accurate. He has been going to podiatry for the last several months. Me a picture on his phone from August and the wound is gotten considerably smaller. He states that it started doing well recently when they started collagen. He has been using a surgical shoe to offload. 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 He had a brace for a long time but now he simply lifts his foot higher to clear but states that most people would not even notice that he had any disability. He is a type 2 diabetes Badik but he has diet-controlled he has lost weight by watching calories. 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 07/16/2019; right fifth toe is closed. Right first toe is smaller. He is using a surgical shoe but he tells me is fairly religious about using his scooter at home. Hopefully this will give him enough offloading to close this. 2/1; right fifth toe dorsally remains closed. Right first toe still not a lot of improvement. Although  superficially  it looks smaller there is undermining laterally from 6-6 of at least 3 to 4 mm. Also worrisome is that the granulation does not look that healthy. We have been using silver collagen He had his foot x-rayed by his podiatrist but I do not have access to this. I am going to x-ray the right great toe again. Otherwise I am thinking he is going to require a total contact cast probably next week and I discussed this with him today. 2/9; his x-ray of the right foot did not show plain x-ray evidence of osteomyelitis. We have been using silver alginate the wound is not improved looks static. He is going to get a total contact cast today He comes in today telling me that before he left podiatry before he came here he had noninvasive arterial studies that were done by a mobile service and the podiatrist office. They are now trying to arrange I think an angiogram however this would be in [facility]. He asked my opinion on this and I told him that angiograms are done by several different doctor groups locally and if he is from [place] I cannot see a reason to go out of [facility] for any procedure he might need. We will see if we can get the actual angiogram report and I will refer him as needed to either vein and vascular or interventional cardiology 2/12; back for his first obligatory total contact cast change. I did get his arterial studies from in [facility]. Biphasic waveforms ABI in the right of 1.16 on the left at 1.14 I am not really sure what was so concerning about this [place] I will simply repeat these studies along with TBI's locally. There should not be any need for him to go out of town to have this evaluation. I am not sure that there is a clinical need 2/16; once again the total contact cast was too loose he has multiple skin excoriations where things were rubbing. He claims to not be on this all that much  and is working from home but between the size of his foot the wasting in the distal part of his lower leg and large calfs we just cannot mold the cast in to fit his leg properly.-Changed him to a forefoot off loader but with his foot drop that may not be possible. 2/23; we put him out in a surgical shoe last week as we did not have a forefoot off loader. We have this today. We are using silver alginate. He did not tolerate a total contact cast 3/1; he is in a forefoot offloading boot. Using silver collagen as of last week. No major change 3/8; he is using a forefoot offloading boot. Silver collagen over the last 2 weeks. Perhaps some reduction in depth. Moreover surface looks healthy 3/15; he is a forefoot offloading boot. Silver collagen over the last 3 weeks. There has been reduction in depth. Much less circumferential callus and thick subcutaneous tissue. 3/22; his original wound on the plantar right great toe is somewhat improved. However he arrives in clinic today with a completely denuded ulcer on the tip of the toe distal to the original wound. More concerning is there is loss of surface epithelium over a wide area around this even dorsally on the toe. He states that he last wrapped the area on Saturday. He was up at [facility] walking this weekend otherwise he had not  done anything different. Noticeable for the fact that his forefoot offloading shoe was not in good condition fact that split open this could have had something to do with this but I have no doubt that there is also some degree of infection/cellulitis 3/29; patient came to the clinic last week with a new wound on the tip of the right great toe. This looked infected there was epithelial loss extending to the base of the first toe dorsally on the right. I gave him empiric doxycycline. Culture I did showed methicillin sensitive staph aureus Proteus and group B strep. I gave him doxycycline which is not a reliable cover of strep or  Proteus. I am going to give him a course of Augmentin today. He has not been systemically unwell. 4/5; original wound on the plantar right great toe. 2 weeks ago had a new wound on the tip of the right great toe with extensive cellulitis. Culture I did at that time ultimately showed methicillin sensitive staph aureus Proteus and group B strep. He received some doxycycline I changed him to Augmentin last week he has 3 more days. He is complaining of some pruritus but no rash. X-ray; showed no bony abnormalities in the right great toe. 4/12; no evidence of infection. Right great toe now working on the original wound. The new wound at the tip of the toe is callused but no open area. 4/19; wound measures about the same. Roughly 3 mm of depth. He has a lot of callus on the tip of the toe where the subsequent wound was however there is no open wound here. Some callus around the circumference of the wound we have been working on 4/26; the wound that he originally had on the plantar aspect about the same. Medially he had a new wound which he says started as an "blood blister". Obviously this has unroofed if they were true. We have been using silver alginate 5/3; the wound on the plantar toe measures smaller although he still has thick callus and skin. It is difficult to tell whether this is going to close or not. The blister that we found medially last week is also close over but not necessarily with a vibrant lady healthy looking surface 5/10; small wound on the right plantar toe still callus debris over the surface requiring debridement. We have been using silver collagen. Noteworthy that the front part of his forefoot offloading shoe is totally worn out suggesting he is really not offloading this toe adequately even using what should be a forefoot offloading shoe. I have asked him to keep his weight back on his heel Electronic Signature(s) Signed: 10/30/2019 8:11:21 AM By: Baltazar Najjar MD Entered By:  Baltazar Najjar on 10/29/2019 08:28:18 -------------------------------------------------------------------------------- Physical Exam Details Patient Name: Date of Service: Allen Gregory NA Gregory 10/29/2019 8:00 A M Medical Record Number: [id number] Patient Account Number: [id number] Date of Birth/Sex: Treating RN: 07-09-1960 (59 y.o. Allen Gregory Primary Care Provider: [name], NO Other Clinician: Referring Provider: Treating Provider/Extender: Valentino Saxon in Treatment: 16 Notes Wound exam; the area on the plantar part of the first toe. Still some callus nonviable skin which I removed with a #3 curette hemostasis with direct pressure. The new area medially remains closed although it is callused. The callus was removed there is no open area Electronic Signature(s) Signed: 10/30/2019 8:11:21 AM By: Baltazar Najjar MD Entered By: Baltazar Najjar on 10/29/2019 16:01:09 -------------------------------------------------------------------------------- Physician Orders Details Patient Name: Date of Service: RO SO Elbert Ewings, Allen  NA Gregory 10/29/2019 8:00 A M Medical Record Number: [id number] Patient Account Number: [id number] Date of Birth/Sex: Treating RN: 1961/01/12 (59 y.o. 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 Dressing Change Frequency Wound #1 Right,Plantar T Great oe Change Dressing every other day. Wound Cleansing May shower and wash wound with soap and water. Primary Wound  Dressing Wound #1 Right,Plantar T Great oe Silver Collagen - moisten with hydrogel or KY jelly Secondary Dressing Wound #1 Right,Plantar T Great oe Foam - foam donut Kerlix/Rolled Gauze - secure with tape Dry Gauze Off-Loading Wedge shoe to: - right foot Electronic Signature(s) Signed: 10/30/2019 8:11:21 AM By: Linton Ham MD Signed: 10/30/2019 5:17:28 PM By: Levan Hurst RN, BSN Entered By: Levan Hurst on 10/29/2019 07:58:43 -------------------------------------------------------------------------------- Problem List Details Patient Name: Date of Service: Allen Gregory NA Gregory 10/29/2019 8:00 A M Medical Record Number: [id number] Patient Account Number: [id number] Date of Birth/Sex: Treating RN: [birth year] (59 y.o. Allen Gregory Primary Care Provider: PA Haig Prophet, NO Other Clinician: Referring Provider: Treating Provider/Extender: Cheree Ditto in Treatment: 16 Active Problems ICD-10 Encounter Code Description Active Date MDM Diagnosis E11.621 Type 2 diabetes mellitus with foot ulcer 07/09/2019 No Yes L97.512 Non-pressure chronic ulcer of other part of right foot with fat layer exposed 07/09/2019 No Yes E11.42 Type 2 diabetes mellitus with diabetic polyneuropathy 07/09/2019 No Yes M21.371 Foot drop, right foot 07/09/2019 No Yes L03.031 Cellulitis of right toe 09/10/2019 No Yes Inactive Problems ICD-10 Code Description Active Date Inactive Date E11.51 Type 2 diabetes mellitus with diabetic peripheral angiopathy without gangrene 07/31/2019 07/31/2019 Resolved Problems Electronic Signature(s) Signed: 10/30/2019 8:11:21 AM By: Linton Ham MD Entered By: Linton Ham on 10/29/2019 08:27:02 -------------------------------------------------------------------------------- Progress Note Details Patient Name: Date of Service: Allen Gregory NA Gregory 10/29/2019 8:00 A M Medical Record Number: [id number] Patient Account Number: [id number] Date of Birth/Sex: Treating  RN: 02-13-1961 (59 y.o. Allen Gregory Primary Care Provider: PA Haig Prophet, NO Other Clinician: Referring Provider: Treating Provider/Extender: Cheree Ditto in Treatment: 16 Subjective History of Present Illness (HPI) ADMISSION 07/09/2019 This is a pleasant 59 year old man who referred himself here for second opinion sign a new area on the right plantar first toe and the dorsal part of his right fifth toe. He says he had these in August when he had removed callus from the first toe and then played pool volleyball for about 4 hours. He thinks the bottom of the pool simply caused an abrasion of the toe. The story sounds accurate. He has been going to podiatry for the last several months. Me a picture on his phone from August and the wound is gotten considerably smaller. He states that it started doing well recently when they started collagen. He has been using a surgical shoe to offload. 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 He had a brace for a long time but now he simply lifts his foot higher to clear but  states that most people would not even notice that he had any disability. He is a type 2 diabetes Badik but he has diet-controlled he has lost weight by watching calories. 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 07/16/2019; right fifth toe is closed. Right first toe is smaller. He is using a surgical shoe but he tells me is fairly religious about using his scooter at home. Hopefully this will give him enough offloading to close this. 2/1; right fifth toe dorsally remains closed. Right first toe still not a lot of improvement. Although superficially it looks smaller there is undermining  laterally from 6-6 of at least 3 to 4 mm. Also worrisome is that the granulation does not look that healthy. We have been using silver collagen He had his foot x-rayed by his podiatrist but I do not have access to this. I am going to x-ray the right great toe again. Otherwise I am thinking he is going to require a total contact cast probably next week and I discussed this with him today. 2/9; his x-ray of the right foot did not show plain x-ray evidence of osteomyelitis. We have been using silver alginate the wound is not improved looks static. He is going to get a total contact cast today He comes in today telling me that before he left podiatry before he came here he had noninvasive arterial studies that were done by a mobile service and the podiatrist office. They are now trying to arrange I think an angiogram however this would be in [facility]. He asked my opinion on this and I told him that angiograms are done by several different doctor groups locally and if he is from [place] I cannot see a reason to go out of [facility] for any procedure he might need. We will see if we can get the actual angiogram report and I will refer him as needed to either vein and vascular or interventional cardiology 2/12; back for his first obligatory total contact cast change. I did get his arterial studies from in [facility] office. Biphasic waveforms ABI in the right of 1.16 on the left at 1.14 I am not really sure what was so concerning about [place] I will simply repeat these studies along with TBI's locally. There should not be any need for him to go out of town to have this evaluation. I am not sure that there is a clinical need 2/16; once again the total contact cast was too loose he has multiple skin excoriations where things were rubbing. He claims to not be on this all that much and is working from home but between the size of  his foot the wasting in the distal part of his lower leg and large calfs we just cannot mold the cast in to fit his leg properly.-Changed him to a forefoot off loader but with his foot drop that may not be possible. 2/23; we put him out in a surgical shoe last week as we did not have a forefoot off loader. We have this today. We are using silver alginate. He did not tolerate a total contact cast 3/1; he is in a forefoot offloading boot. Using silver collagen as of last week. No major change 3/8; he is using a forefoot  offloading boot. Silver collagen over the last 2 weeks. Perhaps some reduction in depth. Moreover surface looks healthy 3/15; he is a forefoot offloading boot. Silver collagen over the last 3 weeks. There has been reduction in depth. Much less circumferential callus and thick subcutaneous tissue. 3/22; his original wound on the plantar right great toe is somewhat improved. However he arrives in clinic today with a completely denuded ulcer on the tip of the toe distal to the original wound. More concerning is there is loss of surface epithelium over a wide area around this even dorsally on the toe. He states that he last wrapped the area on Saturday. He was up at [facility] walking this weekend otherwise he had not done anything different. Noticeable for the fact that his forefoot offloading shoe was not in good condition fact that split open this could have had something to do with this but I have no doubt that there is also some degree of infection/cellulitis 3/29; patient came to the clinic last week with a new wound on the tip of the right great toe. This looked infected there was epithelial loss extending to the base of the first toe dorsally on the right. I gave him empiric doxycycline. Culture I did showed methicillin sensitive staph aureus Proteus and group B strep. I gave him doxycycline which is not a reliable cover of strep or Proteus. I am going to give him a course of Augmentin  today. He has not been systemically unwell. 4/5; original wound on the plantar right great toe. 2 weeks ago had a new wound on the tip of the right great toe with extensive cellulitis. Culture I did at that time ultimately showed methicillin sensitive staph aureus Proteus and group B strep. He received some doxycycline I changed him to Augmentin last week he has 3 more days. He is complaining of some pruritus but no rash. X-ray; showed no bony abnormalities in the right great toe. 4/12; no evidence of infection. Right great toe now working on the original wound. The new wound at the tip of the toe is callused but no open area. 4/19; wound measures about the same. Roughly 3 mm of depth. He has a lot of callus on the tip of the toe where the subsequent wound was however there is no open wound here. Some callus around the circumference of the wound we have been working on 4/26; the wound that he originally had on the plantar aspect about the same. Medially he had a new wound which he says started as an "blood blister". Obviously this has unroofed if they were true. We have been using silver alginate 5/3; the wound on the plantar toe measures smaller although he still has thick callus and skin. It is difficult to tell whether this is going to close or not. The blister that we found medially last week is also close over but not necessarily with a vibrant lady healthy looking surface 5/10; small wound on the right plantar toe still callus debris over the surface requiring debridement. We have been using silver collagen. ooNoteworthy that the front part of his forefoot offloading shoe is totally worn out suggesting he is really not offloading this toe adequately even using what should be a forefoot offloading shoe. I have asked him to keep his weight back on his heel Objective Constitutional Vitals Time Taken: 7:54 AM, Height: 78 in, Weight: 285 lbs, BMI: 32.9, Temperature: 98.2 F, Pulse: 85 bpm,  Respiratory Rate: 20 breaths/min, Blood Pressure: 152/59  mmHg. Integumentary (Hair, Skin) Wound #1 status is Open. Original cause of wound was Gradually Appeared. The wound is located on the [employer],Plantar T Great. The wound measures 0.2cm oe length x 0.2cm width x 0.2cm depth; 0.031cm^2 area and 0.006cm^3 volume. There is Fat Layer (Subcutaneous Tissue) Exposed exposed. There is no tunneling or undermining noted. There is a medium amount of serosanguineous drainage noted. The wound margin is thickened. There is large (67-100%) red granulation within the wound bed. There is no necrotic tissue within the wound bed. 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 There was a Excisional Skin/Subcutaneous Tissue Debridement with a total area of 0.04 sq cm performed by Maxwell Caulobson, Jennene Downie G., MD. With the following instrument(s): Curette to remove Viable and Non-Viable tissue/material. Material removed includes Callus and Subcutaneous Tissue and after achieving pain control using Lidocaine 5% topical ointment. No specimens were taken. A time out was conducted at 08:15, prior to the start of the procedure. A Minimum amount of bleeding was controlled with Pressure. The procedure was tolerated well with a pain level of 0 throughout and a pain level of 0 following the procedure. Post Debridement Measurements: 0.2cm length x 0.2cm width x 0.2cm depth; 0.006cm^3 volume. Character of Wound/Ulcer Post Debridement is improved. Severity of Tissue Post Debridement is: Fat layer exposed. Post procedure  Diagnosis Wound #1: Same as Pre-Procedure Plan Follow-up Appointments: Return Appointment in 1 week. Dressing Change Frequency: Wound #1 Right,Plantar T Great: oe Change Dressing every other day. Wound Cleansing: May shower and wash wound with soap and water. Primary Wound Dressing: Wound #1 Right,Plantar T Great: oe Silver Collagen - moisten with hydrogel or KY jelly Secondary Dressing: Wound #1 Right,Plantar T Great: oe Foam - foam donut Kerlix/Rolled Gauze - secure with tape Dry Gauze Off-Loading: Wedge shoe to: - right foot 1. Right plantar toe I continued with the silver collagen 2. We gave him a new forefoot offloading boot we were not able to fit him with a total contact cast Electronic Signature(s) Signed: 10/30/2019 8:11:21 AM By: Baltazar Najjarobson, Amirra Herling MD Entered By: Baltazar Najjarobson, Doni Bacha on 10/29/2019 56:21:3008:29:27 -------------------------------------------------------------------------------- SuperBill Details Patient Name: Date of Service: Allen RendO SO L, Allen Gregory 10/29/2019 Medical Record Number: [id number] Patient Account Number: [id number] Date of Birth/Sex: Treating RN: 01/16/1961 (59 y.o. 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 M21.371 Foot drop, right foot L03.031 Cellulitis of right toe Facility Procedures CPT4 Code: [id number] Description: 11042 - DEB SUBQ TISSUE 20 SQ CM/< ICD-10 Diagnosis Description L97.512 Non-pressure chronic ulcer of other part of right foot with fat layer exposed Modifier: Quantity: 1 Physician Procedures : CPT4 Code Description Modifier [id number] 11042 - WC PHYS SUBQ TISS 20 SQ CM ICD-10  Diagnosis Description L97.512 Non-pressure chronic ulcer of other part of right foot with fat layer exposed Quantity: 1 Electronic Signature(s) Signed: 10/30/2019 8:11:21 AM By: Baltazar Najjarobson, Karan Inclan  MD Entered By: Baltazar Najjar on 10/29/2019 08:29:39

## 2019-10-30 NOTE — Progress Notes (Signed)
VERDIE, BARROWS ([id number]) Visit Report for 10/29/2019 Arrival Information Details Patient Name: Date of Service: Allen Gregory RD 10/29/2019 8:00 A M Medical Record Number: [id number] Patient Account Number: [id number] Date of Birth/Sex: Treating RN: [birth year] (58 y.o. [name]) Carlene Coria Primary Care Izacc Demeyer: PA Haig Prophet, [place] Other Clinician: Referring Jakeria Caissie: Treating Slater Mcmanaman/Extender: Cheree Ditto in Treatment: 16 Visit Information History Since Last Visit All ordered tests and consults were completed: No Patient Arrived: Ambulatory Added or deleted any medications: No Arrival Time: 07:53 Any new allergies or adverse reactions: No Accompanied By: self Had a fall or experienced change in No Transfer Assistance: None activities of daily living that may affect Patient Identification Verified: Yes risk of falls: Secondary Verification Process Completed: Yes Signs or symptoms of abuse/neglect since last visito No Patient Requires Transmission-Based Precautions: No Hospitalized since last visit: No Patient Has Alerts: Yes Implantable device outside of the clinic excluding No Patient Alerts: Patient on Blood Thinner cellular tissue based products placed in the center since last visit: Has Dressing in Place as Prescribed: Yes Pain Present Now: No Electronic Signature(s) Signed: 10/30/2019 5:53:12 PM By: Carlene Coria RN Entered By: Carlene Coria on 10/29/2019 07:54:53 -------------------------------------------------------------------------------- Encounter Discharge Information Details Patient Name: Date of Service: Allen Gregory, Allen Gregory RD 10/29/2019 8:00 A M Medical Record Number: [id number] Patient Account Number: [id number] Date of Birth/Sex: Treating RN: 1960-10-19 (59 y.o. Marvis Repress Primary Care Shakur Lembo: PA Haig Prophet, NO Other Clinician: Referring Jianni Shelden: Treating Joylynn Defrancesco/Extender: Cheree Ditto in Treatment: 16 Encounter Discharge Information Items Post  Procedure Vitals Discharge Condition: Stable Temperature (F): 98.2 Ambulatory Status: Ambulatory Pulse (bpm): 85 Discharge Destination: Home Respiratory Rate (breaths/min): 20 Transportation: Private Auto Blood Pressure (mmHg): 152/59 Accompanied By: self Schedule Follow-up Appointment: Yes Clinical Summary of Care: Patient Declined Electronic Signature(s) Signed: 10/30/2019 5:14:55 PM By: Kela Millin Entered By: Kela Millin on 10/29/2019 08:39:50 -------------------------------------------------------------------------------- Lower Extremity Assessment Details Patient Name: Date of Service: Allen Jeans Gregory RD 10/29/2019 8:00 A M Medical Record Number: [id number] Patient Account Number: [id number] Date of Birth/Sex: Treating RN: 11/19/1960 (58 y.o. [name]) Carlene Coria Primary Care Talasia Saulter: PA Haig Prophet, NO Other Clinician: Referring Sheralyn Pinegar: Treating Huong Luthi/Extender: Cheree Ditto in Treatment: 16 Edema Assessment Assessed: [Left: No] [Right: No] Edema: [Left: N] [Right: o] Calf Left: Right: Point of Measurement: 38 cm From Medial Instep cm 44 cm Ankle Left: Right: Point of Measurement: 10 cm From Medial Instep cm 23 cm Electronic Signature(s) Signed: 10/30/2019 5:53:12 PM By: Carlene Coria RN Entered By: Carlene Coria on 10/29/2019 08:00:08 -------------------------------------------------------------------------------- Multi Wound Chart Details Patient Name: Date of Service: Allen Gregory, Allen Gregory RD 10/29/2019 8:00 A M Medical Record Number: [id number] Patient Account Number: [id number] Date of Birth/Sex: Treating RN: 1960/11/30 (59 y.o. [name] Primary Care Tanishia Lemaster: [name], NO Other Clinician: Referring Omare Bilotta: Treating Arasely Akkerman/Extender: Cheree Ditto in Treatment: 16 Vital Signs Height(in): 78 Pulse(bpm): 36 Weight(lbs): 285 Blood Pressure(mmHg): 152/59 Body Mass Index(BMI): 33 Temperature(F): 98.2 Respiratory  Rate(breaths/min): 20 Photos: [1:No Photos Right, Plantar T Great oe] [N/A:N/A N/A] Wound Location: [1:Gradually Appeared] [N/A:N/A] Wounding Event: [1:Diabetic Wound/Ulcer of the Lower] [N/A:N/A] Primary Etiology: [1:Extremity Pneumothorax, Deep Vein Thrombosis, N/A] Comorbid History: [1:Type II Diabetes, Neuropathy 01/20/2019] [N/A:N/A] Date Acquired: [1:16] [N/A:N/A] Weeks of Treatment: [1:Open] [N/A:N/A] Wound Status: [1:0.2x0.2x0.2] [N/A:N/A] Measurements L x W x D (cm) [1:0.031] [N/A:N/A] A (cm) : rea [1:0.006] [N/A:N/A] Volume (cm) : [1:93.40%] [N/A:N/A] % Reduction in A rea: [1:97.50%] [N/A:N/A] % Reduction in Volume: [1:Grade  2] [N/A:N/A] Classification: [1:Medium] [N/A:N/A] Exudate A mount: [1:Serosanguineous] [N/A:N/A] Exudate Type: [1:red, brown] [N/A:N/A] Exudate Color: [1:Thickened] [N/A:N/A] Wound Margin: [1:Large (67-100%)] [N/A:N/A] Granulation Amount: [1:Red] [N/A:N/A] Granulation Quality: [1:None Present (0%)] [N/A:N/A] Necrotic Amount: [1:Fat Layer (Subcutaneous Tissue)] [N/A:N/A] Exposed Structures: [1:Exposed: Yes Fascia: No Tendon: No Muscle: No Joint: No Bone: No Medium (34-66%)] [N/A:N/A] Epithelialization: [1:Debridement - Excisional] [N/A:N/A] Debridement: Pre-procedure Verification/Time Out 08:15 [N/A:N/A] Taken: [1:Lidocaine 5% topical ointment] [N/A:N/A] Pain Control: [1:Callus, Subcutaneous] [N/A:N/A] Tissue Debrided: [1:Skin/Subcutaneous Tissue] [N/A:N/A] Level: [1:0.04] [N/A:N/A] Debridement A (sq cm): [1:rea Curette] [N/A:N/A] Instrument: [1:Minimum] [N/A:N/A] Bleeding: [1:Pressure] [N/A:N/A] Hemostasis A chieved: [1:0] [N/A:N/A] Procedural Pain: [1:0] [N/A:N/A] Post Procedural Pain: [1:Procedure was tolerated well] [N/A:N/A] Debridement Treatment Response: [1:0.2x0.2x0.2] [N/A:N/A] Post Debridement Measurements L x W x D (cm) [1:0.006] [N/A:N/A] Post Debridement Volume: (cm) [1:Debridement] [N/A:N/A] Treatment Notes Electronic  Signature(s) Signed: 10/30/2019 8:11:21 AM By: Baltazar Najjar MD Signed: 10/30/2019 5:17:28 PM By: Zandra Abts RN, BSN Entered By: Baltazar Najjar on 10/29/2019 08:27:10 -------------------------------------------------------------------------------- Multi-Disciplinary Care Plan Details Patient Name: Date of Service: Allen Gregory, Allen Gregory Gregory RD 10/29/2019 8:00 A M Medical Record Number: [id number] Patient Account Number: [id number] Date of Birth/Sex: Treating RN: [birth year] (59 y.o. [name] Primary Care Talli Kimmer: [name], NO Other Clinician: Referring Jaidon Ellery: Treating Abdulahi Schor/Extender: Valentino Saxon in Treatment: 16 Active Inactive Wound/Skin Impairment Nursing Diagnoses: Impaired tissue integrity Knowledge deficit related to ulceration/compromised skin integrity Goals: Patient/caregiver will verbalize understanding of skin care regimen Date Initiated: 07/09/2019 Target Resolution Date: 11/30/2019 Goal Status: Active Interventions: Assess patient/caregiver ability to obtain necessary supplies Assess patient/caregiver ability to perform ulcer/skin care regimen upon admission and as needed Assess ulceration(s) every visit Provide education on ulcer and skin care Notes: Electronic Signature(s) Signed: 10/30/2019 5:17:28 PM By: Zandra Abts RN, BSN Entered By: Zandra Abts on 10/29/2019 07:58:59 -------------------------------------------------------------------------------- Pain Assessment Details Patient Name: Date of Service: Allen Gregory Gregory RD 10/29/2019 8:00 A M Medical Record Number: [id number] Patient Account Number: [id number] Date of Birth/Sex: Treating RN: 01-15-1961 (58 y.o. [name]) Yevonne Pax Primary Care Becki Mccaskill: [name], NO Other Clinician: Referring Billee Balcerzak: Treating Jayston Trevino/Extender: Valentino Saxon in Treatment: 16 Active Problems Location of Pain Severity and Description of Pain Patient Has Paino No Site Locations Pain Management and  Medication Current Pain Management: Electronic Signature(s) Signed: 10/30/2019 5:53:12 PM By: Yevonne Pax RN Entered By: Yevonne Pax on 10/29/2019 07:55:23 -------------------------------------------------------------------------------- Patient/Caregiver Education Details Patient Name: Date of Service: Allen Gregory Gregory RD 5/10/2021andnbsp8:00 A M Medical Record Number: [id number] Patient Account Number: [id number] Date of Birth/Gender: Treating RN: 06-11-1961 (59 y.o. [name] Primary Care Physician: [name], NO Other Clinician: Referring Physician: Treating Physician/Extender: Valentino Saxon in Treatment: 16 Education Assessment Education Provided To: Patient Education Topics Provided Wound/Skin Impairment: Methods: Explain/Verbal Responses: State content correctly Electronic Signature(s) Signed: 10/30/2019 5:17:28 PM By: Zandra Abts RN, BSN Entered By: Zandra Abts on 10/29/2019 07:59:45 -------------------------------------------------------------------------------- Wound Assessment Details Patient Name: Date of Service: Allen Gregory Gregory RD 10/29/2019 8:00 A M Medical Record Number: [id number] Patient Account Number: [id number] Date of Birth/Sex: Treating RN: Jul 19, 1960 (58 y.o. [name]) Yevonne Pax Primary Care Benney [place]: [name], NO Other Clinician: Referring Ajahni Nay: Treating Margaree Sandhu/Extender: Valentino Saxon in Treatment: 16 Wound Status Wound Number: 1 Primary Diabetic Wound/Ulcer of the Lower Extremity Etiology: Wound Location: Right, Plantar T Great oe Wound Status: Open Wounding Event: Gradually Appeared Comorbid Pneumothorax, Deep Vein Thrombosis, Type II Diabetes, Date Acquired: 01/20/2019 History: Neuropathy Weeks Of Treatment: 16 Clustered  Wound: No Photos Photo Uploaded By: Benjaman Kindler on 10/30/2019 09:48:18 Wound Measurements Length: (cm) 0.2 Width: (cm) 0.2 Depth: (cm) 0.2 Area: (cm) 0.031 Volume: (cm) 0.006 %  Reduction in Area: 93.4% % Reduction in Volume: 97.5% Epithelialization: Medium (34-66%) Tunneling: No Undermining: No Wound Description Classification: Grade 2 Wound Margin: Thickened Exudate Amount: Medium Exudate Type: Serosanguineous Exudate Color: red, brown Foul Odor After Cleansing: No Slough/Fibrino No Wound Bed Granulation Amount: Large (67-100%) Exposed Structure Granulation Quality: Red Fascia Exposed: No Necrotic Amount: None Present (0%) Fat Layer (Subcutaneous Tissue) Exposed: Yes Tendon Exposed: No Muscle Exposed: No Joint Exposed: No Bone Exposed: No Treatment Notes Wound #1 (Right, Plantar Toe Great) 1. Cleanse With Wound Cleanser 2. Periwound Care Skin Prep 3. Primary Dressing Applied Collegen AG 4. Secondary Dressing Dry Gauze Roll Gauze Foam 5. Secured With Tape Notes foam donut as secondary dressings. Electronic Signature(s) Signed: 10/30/2019 5:53:12 PM By: Yevonne Pax RN Entered By: Yevonne Pax on 10/29/2019 08:00:31 -------------------------------------------------------------------------------- Vitals Details Patient Name: Date of Service: Allen Gregory Gregory RD 10/29/2019 8:00 A M Medical Record Number: [id number] Patient Account Number: [id number] Date of Birth/Sex: Treating RN: 11-06-1960 (58 y.o. [name]) Yevonne Pax Primary Care Keyoni Lapinski: [name], NO Other Clinician: Referring Jonah Nestle: Treating Latoy Labriola/Extender: Valentino Saxon in Treatment: 16 Vital Signs Time Taken: 07:54 Temperature (F): 98.2 Height (in): 78 Pulse (bpm): 85 Weight (lbs): 285 Respiratory Rate (breaths/min): 20 Body Mass Index (BMI): 32.9 Blood Pressure (mmHg): 152/59 Reference Range: 80 - 120 mg / dl Electronic Signature(s) Signed: 10/30/2019 5:53:12 PM By: Yevonne Pax RN Entered By: Yevonne Pax on 10/29/2019 07:55:16

## 2019-11-05 ENCOUNTER — Encounter (HOSPITAL_BASED_OUTPATIENT_CLINIC_OR_DEPARTMENT_OTHER): Payer: 59 | Admitting: Internal Medicine

## 2019-11-12 ENCOUNTER — Encounter (HOSPITAL_BASED_OUTPATIENT_CLINIC_OR_DEPARTMENT_OTHER): Payer: 59 | Admitting: Internal Medicine

## 2019-11-12 DIAGNOSIS — L97512 Non-pressure chronic ulcer of other part of right foot with fat layer exposed: Secondary | ICD-10-CM | POA: Insufficient documentation

## 2019-11-12 DIAGNOSIS — E11621 Type 2 diabetes mellitus with foot ulcer: Secondary | ICD-10-CM | POA: Insufficient documentation

## 2019-11-12 DIAGNOSIS — L03115 Cellulitis of right lower limb: Secondary | ICD-10-CM | POA: Insufficient documentation

## 2019-11-12 DIAGNOSIS — M21371 Foot drop, right foot: Secondary | ICD-10-CM | POA: Insufficient documentation

## 2019-11-12 NOTE — Progress Notes (Signed)
JAVARIOUS, ELSAYED ([id number]) Visit Report for 11/12/2019 Arrival Information Details Patient Name: Date of Service: Allen Gregory NA RD 11/12/2019 8:00 A M Medical Record Number: [id number] Patient Account Number: [id number] Date of Birth/Sex: Treating RN: Oct 04, 1960 (59 y.o. [name] Primary Care Lestine Rahe: [name], NO Other Clinician: Referring Kahli Fitzgerald: Treating Gergory Biello/Extender: Beverly Gust in Treatment: 18 Visit Information History Since Last Visit Added or deleted any medications: No Patient Arrived: Ambulatory Any new allergies or adverse reactions: No Arrival Time: 07:59 Had a fall or experienced change in No Accompanied By: self activities of daily living that may affect Transfer Assistance: None risk of falls: Patient Identification Verified: Yes Signs or symptoms of abuse/neglect since last visito No Secondary Verification Process Completed: Yes Hospitalized since last visit: No Patient Requires Transmission-Based Precautions: No Implantable device outside of the clinic excluding No Patient Has Alerts: Yes cellular tissue based products placed in the center Patient Alerts: Patient on Blood Thinner since last visit: Has Dressing in Place as Prescribed: Yes Has Footwear/Offloading in Place as Prescribed: Yes Right: Wedge Shoe Pain Present Now: No Electronic Signature(s) Signed: 11/12/2019 4:00:55 PM By: Deon Pilling Entered By: Deon Pilling on 11/12/2019 07:59:51 -------------------------------------------------------------------------------- Encounter Discharge Information Details Patient Name: Date of Service: Allen Gregory, Allen Gregory NA RD 11/12/2019 8:00 A M Medical Record Number: [id number] Patient Account Number: [id number] Date of Birth/Sex: Treating RN: [birth year] (59 y.o. Marvis Repress Primary Care Chasady Longwell: PA Haig Prophet, NO Other Clinician: Referring Elleen Coulibaly: Treating Ashlen Kiger/Extender: Beverly Gust in Treatment: 18 Encounter Discharge  Information Items Post Procedure Vitals Discharge Condition: Stable Temperature (F): 98.9 Ambulatory Status: Ambulatory Pulse (bpm): 91 Discharge Destination: Home Respiratory Rate (breaths/min): 18 Transportation: Private Auto Blood Pressure (mmHg): 141/58 Accompanied By: alone Schedule Follow-up Appointment: Yes Clinical Summary of Care: Patient Declined Electronic Signature(s) Signed: 11/12/2019 4:34:07 PM By: Kela Millin Entered By: Kela Millin on 11/12/2019 08:49:01 -------------------------------------------------------------------------------- Lower Extremity Assessment Details Patient Name: Date of Service: Allen Gregory NA RD 11/12/2019 8:00 A M Medical Record Number: [id number] Patient Account Number: [id number] Date of Birth/Sex: Treating RN: 11-12-1960 (59 y.o. [name] Primary Care Badr Piedra: [name], NO Other Clinician: Referring Mohamed Portlock: Treating Kollyns Mickelson/Extender: Beverly Gust in Treatment: 18 Edema Assessment Assessed: [Left: No] [Right: Yes] Edema: [Left: N] [Right: o] Calf Left: Right: Point of Measurement: 38 cm From Medial Instep cm 45 cm Ankle Left: Right: Point of Measurement: 10 cm From Medial Instep cm 24.7 cm Vascular Assessment Pulses: Dorsalis Pedis Palpable: [Right:Yes] Electronic Signature(s) Signed: 11/12/2019 4:00:55 PM By: Deon Pilling Entered By: Deon Pilling on 11/12/2019 08:02:10 -------------------------------------------------------------------------------- Multi-Disciplinary Care Plan Details Patient Name: Date of Service: Allen Gregory NA RD 11/12/2019 8:00 A M Medical Record Number: [id number] Patient Account Number: [id number] Date of Birth/Sex: Treating RN: [birth year] (59 y.o. [name] Primary Care Luda Charbonneau: PA Haig Prophet, NO Other Clinician: Referring Jimia Gentles: Treating Domnique Vantine/Extender: Beverly Gust in Treatment: 18 Active Inactive Wound/Skin Impairment Nursing  Diagnoses: Impaired tissue integrity Knowledge deficit related to ulceration/compromised skin integrity Goals: Patient/caregiver will verbalize understanding of skin care regimen Date Initiated: 07/09/2019 Target Resolution Date: 11/30/2019 Goal Status: Active Interventions: Assess patient/caregiver ability to obtain necessary supplies Assess patient/caregiver ability to perform ulcer/skin care regimen upon admission and as needed Assess ulceration(s) every visit Provide education on ulcer and skin care Notes: Electronic Signature(s) Signed: 11/12/2019 5:12:22 PM By: Levan Hurst RN, BSN Entered By: Levan Hurst on 11/12/2019 08:37:00 -------------------------------------------------------------------------------- Pain Assessment Details Patient Name: Date of Service: RO SO  Abby Potash NA RD 11/12/2019 8:00 A M Medical Record Number: [id number] Patient Account Number: [id number] Date of Birth/Sex: Treating RN: [birth year] (59 y.o. [name] Primary Care Reghan Thul: [name], NO Other Clinician: Referring Melisa Donofrio: Treating Kalandra Masters/Extender: [name] in Treatment: 18 Active Problems Location of Pain Severity and Description of Pain Patient Has Paino No Site Locations Rate the pain. Current Pain Level: 0 Pain Management and Medication Current Pain Management: Medication: No Cold Application: No Rest: No Massage: No Activity: No T.E.N.S.: No Heat Application: No Leg drop or elevation: No Is the Current Pain Management Adequate: Adequate How does your wound impact your activities of daily livingo Sleep: No Bathing: No Appetite: No Relationship With Others: No Bladder Continence: No Emotions: No Bowel Continence: No Work: No Toileting: No Drive: No Dressing: No Hobbies: No Electronic Signature(s) Signed: 11/12/2019 4:00:55 PM By: Shawn Stall Entered By: Shawn Stall on 11/12/2019  08:00:24 -------------------------------------------------------------------------------- Patient/Caregiver Education Details Patient Name: Date of Service: Allen Gregory NA RD 5/24/2021andnbsp8:00 A M Medical Record Number: [id number] Patient Account Number: [id number] Date of Birth/Gender: Treating RN: 14-Jun-1961 (59 y.o. [name] Primary Care Physician: [name], NO Other Clinician: Referring Physician: Treating Physician/Extender: [name] in Treatment: 53 Education Assessment Education Provided To: Patient Education Topics Provided Wound/Skin Impairment: Methods: Explain/Verbal Responses: State content correctly Electronic Signature(s) Signed: 11/12/2019 5:12:22 PM By: Zandra Abts RN, BSN Entered By: Zandra Abts on 11/12/2019 08:37:12 -------------------------------------------------------------------------------- Wound Assessment Details Patient Name: Date of Service: Allen Gregory NA RD 11/12/2019 8:00 A M Medical Record Number: [id number] Patient Account Number: [id number] Date of Birth/Sex: Treating RN: 1961/03/30 (59 y.o. [name] Primary Care Jaymarie Yeakel: [name], NO Other Clinician: Referring Jerzee Jerome: Treating Cheral Cappucci/Extender: [name] in Treatment: 18 Wound Status Wound Number: 1 Primary Diabetic Wound/Ulcer of the Lower Extremity Etiology: Wound Location: Right, Plantar T Great oe Wound Status: Open Wounding Event: Gradually Appeared Comorbid Pneumothorax, Deep Vein Thrombosis, Type II Diabetes, Date Acquired: 01/20/2019 History: Neuropathy Weeks Of Treatment: 18 Clustered Wound: No Photos Photo Uploaded By: Benjaman Kindler on 11/12/2019 14:23:41 Wound Measurements Length: (cm) 0.1 Width: (cm) 0.1 Depth: (cm) 0.2 Area: (cm) 0.008 Volume: (cm) 0.002 % Reduction in Area: 98.3% % Reduction in Volume: 99.2% Epithelialization: Large (67-100%) Tunneling: No Undermining: No Wound Description Classification:  Grade 2 Wound Margin: Thickened Exudate Amount: Small Exudate Type: Serosanguineous Exudate Color: red, brown Foul Odor After Cleansing: No Slough/Fibrino No Wound Bed Granulation Amount: Large (67-100%) Exposed Structure Granulation Quality: Red Fascia Exposed: No Necrotic Amount: None Present (0%) Fat Layer (Subcutaneous Tissue) Exposed: Yes Tendon Exposed: No Muscle Exposed: No Joint Exposed: No Bone Exposed: No Assessment Notes callus noted to periwound. Treatment Notes Wound #1 (Right, Plantar Toe Great) 1. Cleanse With Wound Cleanser 2. Periwound Care Skin Prep 3. Primary Dressing Applied Collegen AG 4. Secondary Dressing Dry Gauze Roll Gauze Foam 5. Secured With Tape Notes foam donut as secondary dressings. Electronic Signature(s) Signed: 11/12/2019 4:00:55 PM By: Shawn Stall Entered By: Shawn Stall on 11/12/2019 08:03:56 -------------------------------------------------------------------------------- Vitals Details Patient Name: Date of Service: Allen Gregory, [name] RD 11/12/2019 8:00 A M Medical Record Number: [id number] Patient Account Number: [id number] Date of Birth/Sex: Treating RN: 10/22/1960 (59 y.o. [name] Primary Care Lovelee Forner: [name], NO Other Clinician: Referring Kimaya Whitlatch: Treating Koralynn Greenspan/Extender: [name] in Treatment: 18 Vital Signs Time Taken: 07:59 Temperature (F): 98.9 Height (in): 78 Pulse (bpm): 91 Weight (lbs): 285 Respiratory Rate (breaths/min): 18 Body Mass Index (  BMI): 32.9 Blood Pressure (mmHg): 141/58 Reference Range: 80 - 120 mg / dl Electronic Signature(s) Signed: 11/12/2019 4:00:55 PM By: Shawn Stall Entered By: Shawn Stall on 11/12/2019 08:00:15

## 2019-11-15 NOTE — Progress Notes (Signed)
Allen Gregory ([id number]) Visit Report for 11/12/2019 Debridement Details Patient Name: Date of Service: Allen Gregory 11/12/2019 8:00 A M Medical Record Number: [id number] Patient Account Number: [id number] Date of Birth/Sex: Treating RN: 27-Dec-1960 (59 y.o. Allen Gregory Primary Care Provider: [name], NO Other Clinician: Referring Provider: Treating Provider/Extender: Allen Gregory in Treatment: 18 Debridement Performed for Assessment: Wound #1 Right,Plantar T Great oe Performed By: Physician Allen Anger, MD Debridement Type: Debridement Severity of Tissue Pre Debridement: Fat layer exposed Level of Consciousness (Pre-procedure): Awake and Alert Pre-procedure Verification/Time Out Yes - 08:33 Taken: Start Time: 08:33 Pain Control: Lidocaine 5% topical ointment T Area Debrided (Gregory x W): otal 1 (cm) x 1 (cm) = 1 (cm) Tissue and other material debrided: Viable, Non-Viable, Callus, Subcutaneous Level: Skin/Subcutaneous Tissue Debridement Description: Excisional Instrument: Curette Bleeding: Minimum Hemostasis Achieved: Pressure End Time: 08:35 Procedural Pain: 0 Post Procedural Pain: 0 Response to Treatment: Procedure was tolerated well Level of Consciousness (Post- Awake and Alert procedure): Post Debridement Measurements of Total Wound Length: (cm) 0.1 Width: (cm) 0.1 Depth: (cm) 0.2 Volume: (cm) 0.002 Character of Wound/Ulcer Post Debridement: Improved Severity of Tissue Post Debridement: Fat layer exposed Post Procedure Diagnosis Same as Pre-procedure Electronic Signature(s) Signed: 11/12/2019 5:12:22 PM By: Allen Abts RN, BSN Signed: 11/15/2019 4:57:23 PM By: Allen Anger MD, MBA Entered By: Allen Gregory on 11/12/2019 08:34:56 -------------------------------------------------------------------------------- HPI Details Patient Name: Date of Service: Allen Gregory, Allen Gregory 11/12/2019 8:00 A M Medical Record Number: [id number] Patient Account  Number: [id number] Date of Birth/Sex: Treating RN: [birth year] (59 y.o. Allen Gregory Primary Care Provider: PA Allen Gregory, NO Other Clinician: Referring Provider: Treating Provider/Extender: Allen Gregory in Treatment: 18 History of Present Illness HPI Description: ADMISSION 07/09/2019 This is a pleasant 59 year old man who referred himself here for second opinion sign a new area on the right plantar first toe and the dorsal part of his right fifth toe. He says he had these in August when he had removed callus from the first toe and then played pool volleyball for about 4 hours. He thinks the bottom of the pool simply caused an abrasion of the toe. The story sounds accurate. He has been going to podiatry for the last several months. Me a picture on his phone from August and the wound is gotten considerably smaller. He states that it started doing well recently when they started collagen. He has been using a surgical shoe to offload. 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 He had a brace for a long time but now he simply lifts his foot higher to clear but states that most people would not even notice that he had any disability. He is a type 2 diabetes Badik but he has diet-controlled he has lost weight by watching calories. 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 07/16/2019; right fifth toe is closed. Right first toe is smaller. He is using a surgical shoe but he tells me is fairly religious about using his scooter at home. Hopefully this will give him enough offloading to close this. 2/1; right fifth toe dorsally remains closed. Right first toe still not a lot of improvement. Although  superficially  it looks smaller there is undermining laterally from 6-6 of at least 3 to 4 mm. Also worrisome is that the granulation does not look that healthy. We have been using silver collagen He had his foot x-rayed by his podiatrist but I do not have access to this. I am going to x-ray the right great toe again. Otherwise I am thinking he is going to require a total contact cast probably next week and I discussed this with him today. 2/9; his x-ray of the right foot did not show plain x-ray evidence of osteomyelitis. We have been using silver alginate the wound is not improved looks static. He is going to get a total contact cast today He comes in today telling me that before he left podiatry before he came here he had noninvasive arterial studies that were done by a mobile service and the podiatrist office. They are now trying to arrange I think an angiogram however this would be in [facility]. He asked my opinion on this and I told him that angiograms are done by several different doctor groups locally and if he is from [place] I cannot see a reason to go out of [facility] for any procedure he might need. We will see if we can get the actual angiogram report and I will refer him as needed to either vein and vascular or interventional cardiology 2/12; back for his first obligatory total contact cast change. I did get his arterial studies from [facility]. Biphasic waveforms ABI in the right of 1.16 on the left at 1.14 I am not really sure what was so concerning about this [place] I will simply repeat these studies along with TBI's locally. There should not be any need for him to go out of town to have this evaluation. I am not sure that there is a clinical need 2/16; once again the total contact cast was too loose he has multiple skin excoriations where things were rubbing. He claims to not be on this all that much  and is working from home but between the size of his foot the wasting in the distal part of his lower leg and large calfs we just cannot mold the cast in to fit his leg properly.-Changed him to a forefoot off loader but with his foot drop that may not be possible. 2/23; we put him out in a surgical shoe last week as we did not have a forefoot off loader. We have this today. We are using silver alginate. He did not tolerate a total contact cast 3/1; he is in a forefoot offloading boot. Using silver collagen as of last week. No major change 3/8; he is using a forefoot offloading boot. Silver collagen over the last 2 weeks. Perhaps some reduction in depth. Moreover surface looks healthy 3/15; he is a forefoot offloading boot. Silver collagen over the last 3 weeks. There has been reduction in depth. Much less circumferential callus and thick subcutaneous tissue. 3/22; his original wound on the plantar right great toe is somewhat improved. However he arrives in clinic today with a completely denuded ulcer on the tip of the toe distal to the original wound. More concerning is there is loss of surface epithelium over a wide area around this even dorsally on the toe. He states that he last wrapped the area on Saturday. He was up at [facility] walking this weekend otherwise he had not  done anything different. Noticeable for the fact that his forefoot offloading shoe was not in good condition fact that split open this could have had something to do with this but I have no doubt that there is also some degree of infection/cellulitis 3/29; patient came to the clinic last week with a new wound on the tip of the right great toe. This looked infected there was epithelial loss extending to the base of the first toe dorsally on the right. I gave him empiric doxycycline. Culture I did showed methicillin sensitive staph aureus Proteus and group B strep. I gave him doxycycline which is not a reliable cover of strep or  Proteus. I am going to give him a course of Augmentin today. He has not been systemically unwell. 4/5; original wound on the plantar right great toe. 2 weeks ago had a new wound on the tip of the right great toe with extensive cellulitis. Culture I did at that time ultimately showed methicillin sensitive staph aureus Proteus and group B strep. He received some doxycycline I changed him to Augmentin last week he has 3 more days. He is complaining of some pruritus but no rash. X-ray; showed no bony abnormalities in the right great toe. 4/12; no evidence of infection. Right great toe now working on the original wound. The new wound at the tip of the toe is callused but no open area. 4/19; wound measures about the same. Roughly 3 mm of depth. He has a lot of callus on the tip of the toe where the subsequent wound was however there is no open wound here. Some callus around the circumference of the wound we have been working on 4/26; the wound that he originally had on the plantar aspect about the same. Medially he had a new wound which he says started as an "blood blister". Obviously this has unroofed if they were true. We have been using silver alginate 5/3; the wound on the plantar toe measures smaller although he still has thick callus and skin. It is difficult to tell whether this is going to close or not. The blister that we found medially last week is also close over but not necessarily with a vibrant lady healthy looking surface 5/10; small wound on the right plantar toe still callus debris over the surface requiring debridement. We have been using silver collagen. Noteworthy that the front part of his forefoot offloading shoe is totally worn out suggesting he is really not offloading this toe adequately even using what should be a forefoot offloading shoe. I have asked him to keep his weight back on his heel 11/12/19-Patient returns at 2 weeks, right plantar toe wound with some callus debris that  also required debridement, patient has a new offloading shoe that is functioning better for him. Wound appears to be smaller by using silver alginate Electronic Signature(s) Signed: 11/12/2019 8:37:26 AM By: Allen Anger MD, MBA Entered By: Allen Gregory on 11/12/2019 08:37:25 -------------------------------------------------------------------------------- Physical Exam Details Patient Name: Date of Service: Allen Gregory 11/12/2019 8:00 A M Medical Record Number: [id number] Patient Account Number: [id number] Date of Birth/Sex: Treating RN: 04-13-1961 (59 y.o. Allen Gregory Primary Care Provider: PA Allen Gregory, NO Other Clinician: Referring Provider: Treating Provider/Extender: Allen Gregory in Treatment: 18 Constitutional alert and oriented x 3. sitting or standing blood pressure is within target range for patient.. supine blood pressure is within target range for patient.. pulse regular and within target range for patient.Marland Kitchen respirations regular, non-labored and within  target range for patient.Marland Kitchen temperature within target range for patient.. . . Well- nourished and well-hydrated in no acute distress. Notes Debrided callus around the rim and partially subcu in the wound with a curette, the right great toe plantar wound otherwise appearing stable Electronic Signature(s) Signed: 11/12/2019 8:38:00 AM By: Allen Anger MD, MBA Entered By: Allen Gregory on 11/12/2019 08:38:00 -------------------------------------------------------------------------------- Physician Orders Details Patient Name: Date of Service: Allen Gregory 11/12/2019 8:00 A M Medical Record Number: [id number] Patient Account Number: [id number] Date of Birth/Sex: Treating RN: 09-Feb-1961 (59 y.o. 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 Dressing Change Frequency Wound #1 Right,Plantar T Great oe Change Dressing every other day. Wound Cleansing May shower and wash wound with soap and water. Primary Wound Dressing Wound #1 Right,Plantar T Great oe Silver Collagen - moisten with hydrogel or KY jelly Secondary Dressing Wound #1 Right,Plantar T Great oe Foam - foam donut Kerlix/Rolled Gauze - secure with tape Dry Gauze Off-Loading Wedge shoe to: - right foot Electronic Signature(s) Signed: 11/12/2019 5:12:22 PM By: Allen Abts RN, BSN Signed: 11/15/2019 4:57:23 PM By: Allen Anger MD, MBA Entered By: Allen Gregory on 11/12/2019 08:33:25 -------------------------------------------------------------------------------- Problem List Details Patient Name: Date of Service: Allen Gregory 11/12/2019 8:00 A M Medical Record Number: [id number] Patient Account Number: [id number] Date of Birth/Sex: Treating RN: May 14, 1961 (59 y.o. Allen Gregory Primary Care Provider: PA Allen Gregory, NO Other Clinician: Referring Provider: Treating Provider/Extender: Allen Gregory in Treatment: 18 Active Problems ICD-10 Encounter Code Description Active Date MDM Diagnosis E11.621 Type 2 diabetes mellitus with foot ulcer 07/09/2019 No Yes L97.512 Non-pressure chronic ulcer of other part of right foot with fat layer exposed 07/09/2019 No Yes E11.42 Type 2 diabetes mellitus with diabetic polyneuropathy 07/09/2019 No Yes M21.371 Foot drop, right foot 07/09/2019 No Yes L03.031 Cellulitis of right toe 09/10/2019 No Yes Inactive Problems ICD-10 Code Description Active Date Inactive Date E11.51  Type 2 diabetes mellitus with diabetic peripheral angiopathy without gangrene 07/31/2019 07/31/2019 Resolved Problems Electronic Signature(s) Signed: 11/12/2019 5:12:22 PM By: Allen Abts RN, BSN Signed: 11/15/2019 4:57:23 PM By: Allen Anger MD, MBA Entered By: Allen Gregory on 11/12/2019 08:33:01 -------------------------------------------------------------------------------- Progress Note Details Patient Name: Date of Service: Allen Gregory 11/12/2019 8:00 A M Medical Record Number: [id number] Patient Account Number: [id number] Date of Birth/Sex: Treating RN: 20-Jun-1961 (59 y.o. Allen Gregory Primary Care Provider: PA Allen Gregory, NO Other Clinician: Referring Provider: Treating Provider/Extender: Allen Gregory in Treatment: 18 Subjective History of Present Illness (HPI) ADMISSION 07/09/2019 This is a pleasant 59 year old man who referred himself here for second opinion sign a new area on the right plantar first toe and the dorsal part of his right fifth toe. He says he had these in August when he had removed callus from the first toe and then played pool volleyball for about 4 hours. He thinks the bottom of the pool simply caused an abrasion of the toe. The story sounds accurate. He has been going to podiatry for the last several months. Me a picture  on his phone from August and the wound is gotten considerably smaller. He states that it started doing well recently when they started collagen. He has been using a surgical shoe to offload. 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 He had a brace for a long time but now he simply lifts his foot higher to clear but states that most people would not even notice that he had any disability. He is a type 2 diabetes Badik but he has diet-controlled he has lost weight by watching  calories. 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 07/16/2019; right fifth toe is closed. Right first toe is smaller. He is using a surgical shoe but he tells me is fairly religious about using his scooter at home. Hopefully this will give him enough offloading to close this. 2/1; right fifth toe dorsally remains closed. Right first toe still not a lot of improvement. Although superficially it looks smaller there is undermining laterally from 6-6 of at least 3 to 4 mm. Also worrisome is that the granulation does not look that healthy. We have been using silver collagen He had his foot x-rayed by his podiatrist but I do not have access to this. I am going to x-ray the right great toe again. Otherwise I am thinking he is going to require a total contact cast probably next week and I discussed this with him today. 2/9; his x-ray of the right foot did not show plain x-ray evidence of osteomyelitis. We have been using silver alginate the wound is not improved looks static. He is going to get a total contact cast today He comes in today telling me that before he left podiatry before he came here he had noninvasive arterial studies that were done by a mobile service and the podiatrist office. They are now trying to arrange I think an angiogram however this would be in [facility]. He asked my opinion on this and I told him that angiograms are done by several different doctor groups locally and if he is from [place] I cannot see a reason to go out of [facility] for any procedure he might need. We will see if we can get the actual angiogram report and I will refer him as needed to either vein and vascular or interventional cardiology 2/12; back for his first obligatory total contact cast change. I did get his arterial studies from in [facility] office. Biphasic waveforms ABI in the right of 1.16 on the left at 1.14 I am not really sure what was so concerning about [place] I will simply repeat these studies along with TBI's locally. There should not be any need for him to go out of town to have this evaluation. I am not sure that there is a clinical need 2/16; once again the total contact cast was too loose he has multiple skin excoriations where things were rubbing. He claims to not be on this all that much and is working from home but between the size of his foot the wasting in the distal part of his lower leg and large calfs we just cannot  mold the cast in to fit his leg properly.-Changed him to a forefoot off loader but with his foot drop that may not be possible. 2/23; we put him out in a surgical shoe last week as we did not have a forefoot off loader. We have this today. We are using silver alginate. He did not tolerate a total contact cast 3/1; he is in a forefoot offloading boot. Using silver collagen as of last week. No major change 3/8; he is using a forefoot offloading boot. Silver collagen over the last 2 weeks. Perhaps some reduction in depth. Moreover surface looks healthy 3/15; he is a forefoot offloading boot. Silver collagen over the last 3 weeks. There has been reduction in depth. Much less circumferential callus and thick subcutaneous tissue. 3/22; his original wound on the plantar right great toe is somewhat improved. However he arrives in clinic today with a completely denuded ulcer on the tip of the toe distal to the original wound. More concerning is there is loss of surface epithelium over a wide area around this even dorsally on the toe. He states that he last wrapped the area on Saturday. He was up at [facility] walking this weekend otherwise he had not done anything different. Noticeable for the fact that his forefoot offloading shoe was not in good  condition fact that split open this could have had something to do with this but I have no doubt that there is also some degree of infection/cellulitis 3/29; patient came to the clinic last week with a new wound on the tip of the right great toe. This looked infected there was epithelial loss extending to the base of the first toe dorsally on the right. I gave him empiric doxycycline. Culture I did showed methicillin sensitive staph aureus Proteus and group B strep. I gave him doxycycline which is not a reliable cover of strep or Proteus. I am going to give him a course of Augmentin today. He has not been systemically unwell. 4/5; original wound on the plantar right great toe. 2 weeks ago had a new wound on the tip of the right great toe with extensive cellulitis. Culture I did at that time ultimately showed methicillin sensitive staph aureus Proteus and group B strep. He received some doxycycline I changed him to Augmentin last week he has 3 more days. He is complaining of some pruritus but no rash. X-ray; showed no bony abnormalities in the right great toe. 4/12; no evidence of infection. Right great toe now working on the original wound. The new wound at the tip of the toe is callused but no open area. 4/19; wound measures about the same. Roughly 3 mm of depth. He has a lot of callus on the tip of the toe where the subsequent wound was however there is no open wound here. Some callus around the circumference of the wound we have been working on 4/26; the wound that he originally had on the plantar aspect about the same. Medially he had a new wound which he says started as an "blood blister". Obviously this has unroofed if they were true. We have been using silver alginate 5/3; the wound on the plantar toe measures smaller although he still has thick callus and skin. It is difficult to tell whether this is going to close or not. The blister that we found medially last week is also close over but not  necessarily with a vibrant lady healthy looking surface 5/10; small wound on the right plantar toe  still callus debris over the surface requiring debridement. We have been using silver collagen. ooNoteworthy that the front part of his forefoot offloading shoe is totally worn out suggesting he is really not offloading this toe adequately even using what should be a forefoot offloading shoe. I have asked him to keep his weight back on his heel 11/12/19-Patient returns at 2 weeks, right plantar toe wound with some callus debris that also required debridement, patient has a new offloading shoe that is functioning better for him. Wound appears to be smaller by using silver alginate Objective Constitutional alert and oriented x 3. sitting or standing blood pressure is within target range for patient.. supine blood pressure is within target range for patient.. pulse regular and within target range for patient.Marland Kitchen respirations regular, non-labored and within target range for patient.Marland Kitchen temperature within target range for patient.. Well- nourished and well-hydrated in no acute distress. Vitals Time Taken: 7:59 AM, Height: 78 in, Weight: 285 lbs, BMI: 32.9, Temperature: 98.9 F, Pulse: 91 bpm, Respiratory Rate: 18 breaths/min, Blood Pressure: 141/58 mmHg. General Notes: Debrided callus around the rim and partially subcu in the wound with a curette, the right great toe plantar wound otherwise appearing stable Integumentary (Hair, Skin) Wound #1 status is Open. Original cause of wound was Gradually Appeared. The wound is located on the [employer]. The wound measures 0.1cm oe length x 0.1cm width x 0.2cm depth; 0.008cm^2 area and 0.002cm^3 volume. There is Fat Layer (Subcutaneous Tissue) Exposed exposed. There is no tunneling or undermining noted. There is a small amount of serosanguineous drainage noted. The wound margin is thickened. There is large (67-100%) red granulation within the wound bed.  There is no necrotic tissue within the wound bed. General Notes: callus noted to periwound. 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 There was a Excisional Skin/Subcutaneous Tissue Debridement with a total area of 1 sq cm performed by Allen Anger, MD. With the following instrument(s): Curette to remove Viable and Non-Viable tissue/material. Material removed includes Callus and Subcutaneous Tissue and after achieving pain control using Lidocaine 5% topical ointment. No specimens were taken. A time out was conducted at 08:33, prior to the start of the procedure. A Minimum amount of bleeding was controlled with Pressure. The procedure was tolerated well with a pain level of 0 throughout and a pain level of 0 following the procedure. Post Debridement Measurements: 0.1cm length x 0.1cm width x 0.2cm depth; 0.002cm^3 volume. Character of Wound/Ulcer Post Debridement is improved. Severity of Tissue Post Debridement is: Fat layer exposed. Post procedure Diagnosis Wound #1: Same as Pre-Procedure Plan Follow-up Appointments: Return Appointment in 2 weeks. Dressing Change Frequency: Wound #1 Right,Plantar T Great: oe Change Dressing every other day. Wound Cleansing: May shower and wash wound with soap and water. Primary Wound Dressing: Wound #1 Right,Plantar T Great: oe Silver Collagen - moisten with hydrogel or KY jelly Secondary Dressing: Wound #1 Right,Plantar T Great: oe Foam - foam donut Kerlix/Rolled Gauze - secure with tape Dry Gauze Off-Loading: Wedge shoe to: -  right foot 1. Continue using silver alginate to the wound 2. Continue offloading with the surgical shoe  3. Return to clinic In 2 weeks Electronic Signature(s) Signed: 11/12/2019 8:38:57 AM By: Allen AngerMadduri, Jizel Cheeks MD, MBA Previous Signature: 11/12/2019 8:38:32 AM Version By: Allen AngerMadduri, Alica Shellhammer MD, MBA Entered By: Allen AngerMadduri, Tillie Viverette on 11/12/2019 08:38:57 -------------------------------------------------------------------------------- SuperBill Details Patient Name: Date of Service: Allen Gregory, Allen Gregory 11/12/2019 Medical Record Number: [id number] Patient Account Number: [id number] Date of Birth/Sex: Treating RN: 12/24/1960 (59 y.o. 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 M21.371 Foot drop, right foot L03.031 Cellulitis of right toe Facility Procedures CPT4 Code: [id number] Description: 11042 - DEB SUBQ TISSUE 20 SQ CM/< ICD-10 Diagnosis Description E11.621 Type 2 diabetes mellitus with foot ulcer Modifier: Quantity: 1 Physician Procedures : CPT4 Code Description Modifier [id number] 11042 - WC PHYS SUBQ TISS 20 SQ CM ICD-10 Diagnosis Description E11.621 Type 2 diabetes mellitus with foot ulcer Quantity: 1 Electronic Signature(s) Signed: 11/12/2019 8:39:11 AM By: Allen AngerMadduri, Gizel Riedlinger MD, MBA Entered By: Allen AngerMadduri, Jude Linck on 11/12/2019 08:39:10

## 2019-11-26 ENCOUNTER — Encounter (HOSPITAL_BASED_OUTPATIENT_CLINIC_OR_DEPARTMENT_OTHER): Payer: 59 | Admitting: Internal Medicine

## 2020-04-05 ENCOUNTER — Ambulatory Visit: Payer: 59

## 2020-10-08 IMAGING — DX DG FOOT COMPLETE 3+V*R*
3 series · 3 of 3 positions shown · non-contrast
Comparison: 07/27/2019

CLINICAL DATA: Diabetic foot ulcer

EXAM:
RIGHT FOOT COMPLETE - 3+ VIEW

[foot ap]
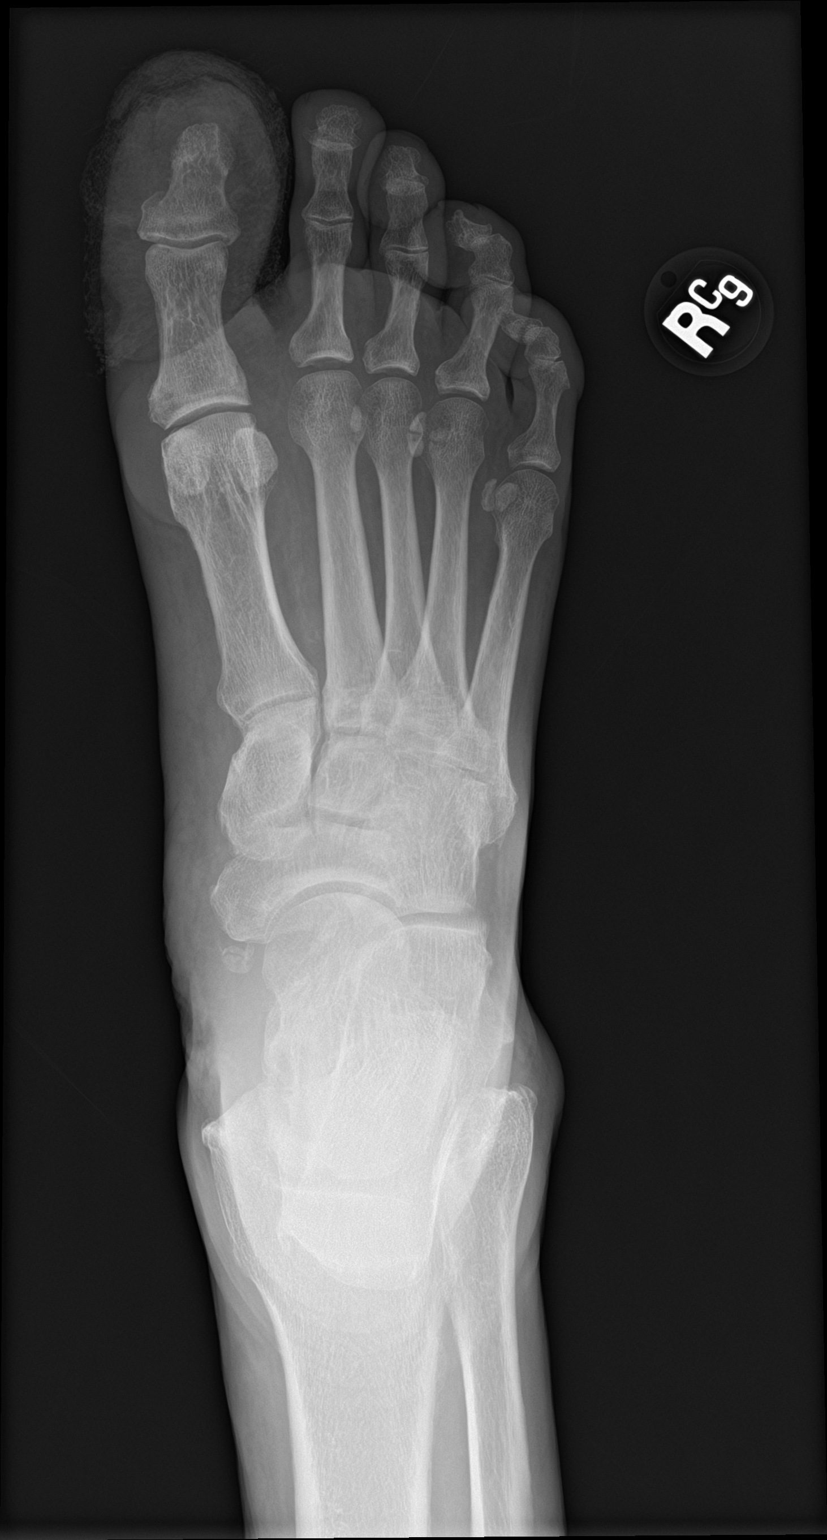

[foot obl]
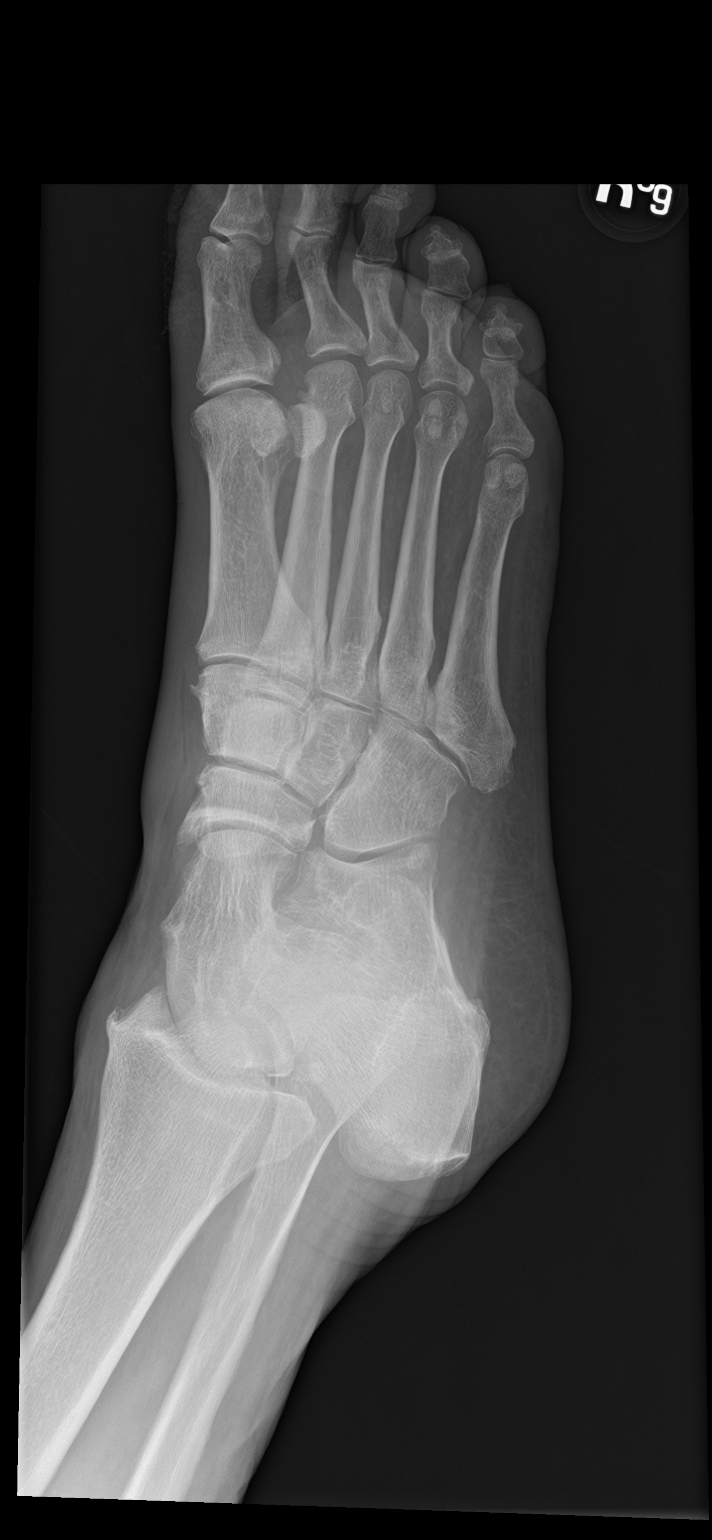

[foot lat]
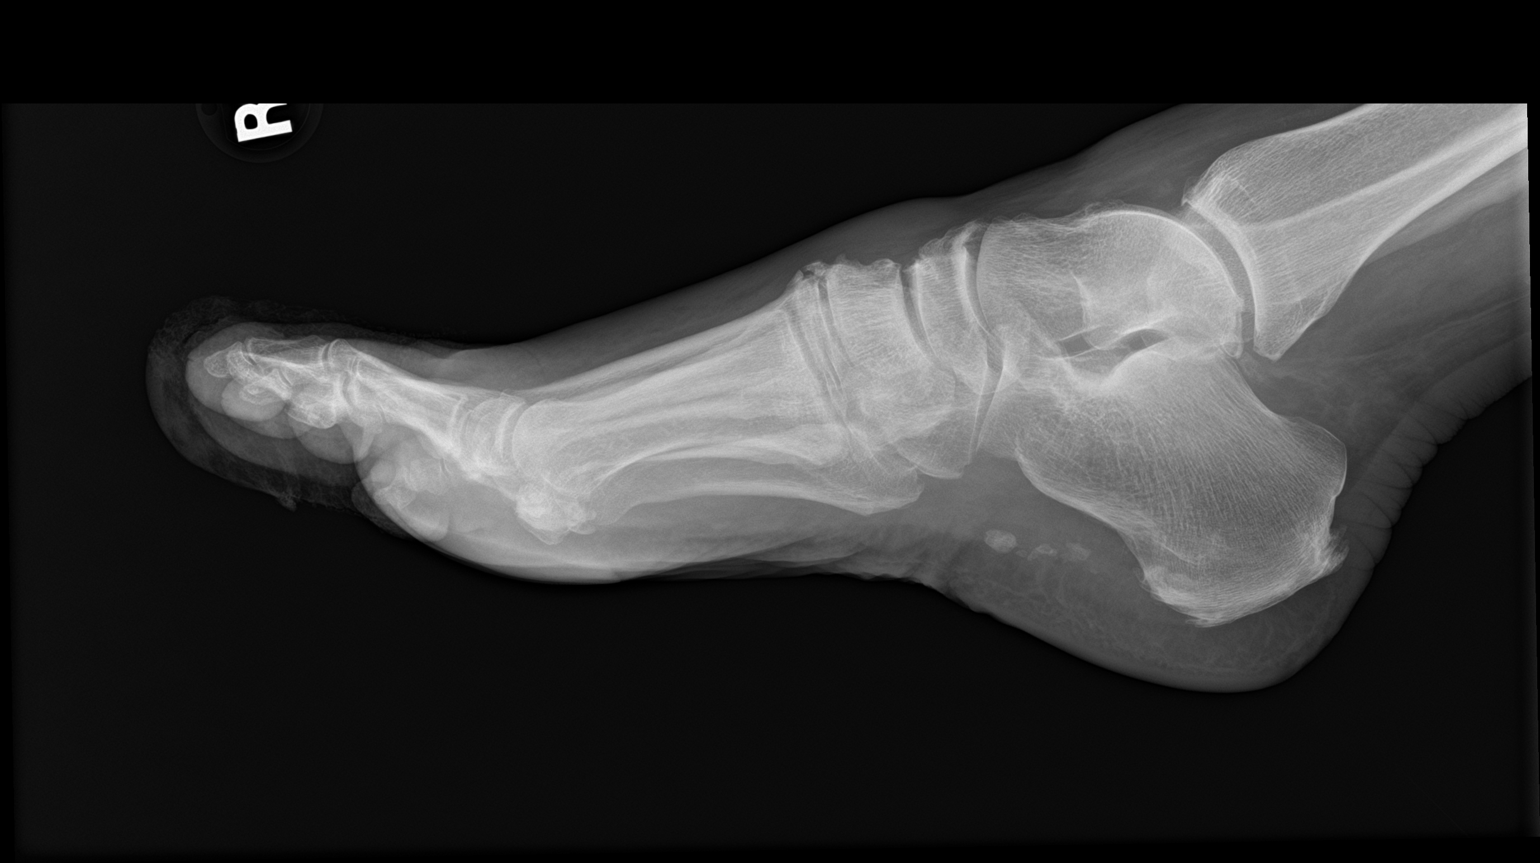

[3 of 3 positions shown; findings below may reference images not displayed]

FINDINGS: No fracture or malalignment. No radiopaque foreign body or soft
tissue emphysema. No bone destruction or periostitis. Plantar soft
tissue calcifications.
IMPRESSION: No acute osseous abnormality.

## 2022-10-14 ENCOUNTER — Ambulatory Visit (INDEPENDENT_AMBULATORY_CARE_PROVIDER_SITE_OTHER): Payer: 59 | Admitting: Podiatry

## 2022-10-14 ENCOUNTER — Encounter: Payer: Self-pay | Admitting: Podiatry

## 2022-10-14 ENCOUNTER — Ambulatory Visit (INDEPENDENT_AMBULATORY_CARE_PROVIDER_SITE_OTHER): Payer: 59

## 2022-10-14 DIAGNOSIS — L97522 Non-pressure chronic ulcer of other part of left foot with fat layer exposed: Secondary | ICD-10-CM | POA: Diagnosis not present

## 2022-10-14 DIAGNOSIS — E1149 Type 2 diabetes mellitus with other diabetic neurological complication: Secondary | ICD-10-CM

## 2022-10-14 DIAGNOSIS — M2141 Flat foot [pes planus] (acquired), right foot: Secondary | ICD-10-CM

## 2022-10-14 DIAGNOSIS — M2142 Flat foot [pes planus] (acquired), left foot: Secondary | ICD-10-CM | POA: Diagnosis not present

## 2022-10-14 MED ORDER — SILVER SULFADIAZINE 1 % EX CREA: 1.0000 | TOPICAL_CREAM | Freq: Every day | CUTANEOUS | 0 refills | Status: DC

## 2022-10-14 NOTE — Progress Notes (Signed)
Subjective:   Patient ID: Allen Gregory, male   DOB: 62 y.o.   MRN: [id number]   HPI Chief Complaint  Patient presents with   Foot Pain    Medial foot left - went on a trip, got stuck on an [place] and had to do a lot of walking to get back x 6 weeks ago, developed a blister, now has callused, darkened, tried keeping clean, diabetic - A1c was high at last check, some neuropathy, currently on amoxicillin    New Patient (Initial Visit)   62 year old male presents the office with above concerns.  Had to walk 8 miles after [place] a port while on a [place] not wearing shoes and developed a blister which turned into a wound.  Currently denies any fevers or chills.  Last A1c 8.5 on 10/05/2022   Review of Systems  All other systems reviewed and are negative.  No past medical history on file.  No past surgical history on file.   Current Outpatient Medications:    amoxicillin-clavulanate (AUGMENTIN) 875-125 MG tablet, Take 1 tablet by mouth 2 (two) times daily., Disp: , Rfl:    dapagliflozin propanediol (FARXIGA) 10 MG TABS tablet, Take by mouth., Disp: , Rfl:    hydrochlorothiazide (HYDRODIURIL) 25 MG tablet, Take 1 tablet by mouth daily., Disp: , Rfl:    losartan (COZAAR) 100 MG tablet, TAKE ONE TABLET (100 MG DOSE) BY MOUTH DAILY., Disp: , Rfl:    OZEMPIC, 0.25 OR 0.5 MG/DOSE, 2 MG/3ML SOPN, Inject into the skin., Disp: , Rfl:    scopolamine (TRANSDERM-SCOP) 1 MG/3DAYS, Place patch on skin 1 day prior to travel; replace every 72 hours until travel is completed., Disp: , Rfl:    sildenafil (VIAGRA) 100 MG tablet, Take 1 tablet by mouth daily as needed., Disp: , Rfl:    silver sulfADIAZINE (SILVADENE) 1 % cream, Apply 1 Application topically daily., Disp: 50 g, Rfl: 0   XARELTO 20 MG TABS tablet, Take 1 tablet by mouth daily., Disp: , Rfl:   Allergies  Allergen Reactions   Lisinopril     Other Reaction(s): Cough, Not available          Objective:  Physical Exam  General:  AAO x3, NAD  Dermatological: On medial aspect of foot there is thick hyperkeratotic tissue.  Over the plantar medial aspect along the central area is a granular wound measuring 1.5 x 1.5 cm without any probing, and or tunneling.  Pre and post measurements are the same.  There is no fluctuance or crepitation.  There is no malodor.  No ascending cellulitis.  Vascular: Dorsalis Pedis artery and Posterior Tibial artery pedal pulses are 2/4 bilateral with immedate capillary fill time. There is no pain with calf compression, swelling, warmth, erythema.   Neruologic: Sensation decreased with Semmes Weinstein monofilament.  Musculoskeletal: Flatfoot present. Gait: Unassisted, Nonantalgic.       Assessment:   Ulceration left foot     Plan:  -Treatment options discussed including all alternatives, risks, and complications -Etiology of symptoms were discussed -X-rays were obtained and reviewed with the patient.  3 views of the foot were obtained.  No evidence of acute fracture.  No definitive cortical changes suggest osteomyelitis.  No soft tissue edema.  Arthritic changes present. -Medically necessary wound debridement performed.  I utilized a #312 with scalpel to sharply debride the wound down to healthy, bleeding, clean the tissue removed nonviable devitalized tissue to promote wound healing.  There is minimal blood loss.  Hemostasis sheath  and manual compression.  Silvadene was applied followed by dressing.  Continue daily dressing changes.  Offloading.  Cam boot was dispensed for immobilization, offloading. Monitor for any clinical signs or symptoms of infection and directed to call the office immediately should any occur or go to the ER.  Return in about 2 weeks (around 10/28/2022) for foot ulcer.  Vivi Barrack DPM

## 2022-10-14 NOTE — Patient Instructions (Signed)
Wash with soap and water, dry well. Apply a small amount of silvadene and then cover with a bandage.  Wear boot to keep pressure off  Monitor for any signs/symptoms of infection. Call the office immediately if any occur or go directly to the emergency room. Call with any questions/concerns.

## 2022-10-18 ENCOUNTER — Ambulatory Visit: Payer: 59 | Admitting: Podiatry

## 2022-10-29 ENCOUNTER — Ambulatory Visit (INDEPENDENT_AMBULATORY_CARE_PROVIDER_SITE_OTHER): Payer: 59 | Admitting: Podiatry

## 2022-10-29 DIAGNOSIS — L97522 Non-pressure chronic ulcer of other part of left foot with fat layer exposed: Secondary | ICD-10-CM | POA: Diagnosis not present

## 2022-10-29 DIAGNOSIS — M2142 Flat foot [pes planus] (acquired), left foot: Secondary | ICD-10-CM

## 2022-10-29 DIAGNOSIS — E1149 Type 2 diabetes mellitus with other diabetic neurological complication: Secondary | ICD-10-CM

## 2022-10-29 DIAGNOSIS — M2141 Flat foot [pes planus] (acquired), right foot: Secondary | ICD-10-CM

## 2022-10-29 NOTE — Progress Notes (Signed)
Subjective: Chief Complaint  Patient presents with   Foot Ulcer    2 week follow up left foot - patient reports that he is unable to wear the boot as recommended because it's too painful on his ankle   62 year old male presents the office today to be done well since.  He presents for follow-up evaluation of left foot ulceration.  Is not able to wear the cam boot as it was causing pain to his ankle given how flat his foot is.  He has been keeping Silvadene on the wound daily as well as a pad.  He is wearing regular shoes today.  Does not report any fevers or chills.  No other concerns.  Objective: AAO x3, NAD DP/PT pulses palpable bilaterally, CRT less than 3 seconds Significant decrease in the arch upon weightbearing.  In the plantar medial aspect of the left foot hyperkeratotic lesion with central ulceration.  Ulceration measures 1 x 0.7 x 0.1 cm with 100% granulation tissue.  There is no drainage or pus.  There is no probing, and or tunneling.  No surrounding erythema, ascending cellulitis.  There is no fluctuance or crepitation.  There is no malodor.  Hyperkeratotic lesion along the medial first metatarsal head without any ongoing ulceration today. No pain with calf compression, swelling, warmth, erythema  Assessment: Ulceration left foot  Plan: -All treatment options discussed with the patient including all alternatives, risks, complications.  -Medically necessary wound debridement was performed today.  I sharply debrided the wound with #312 with scalpel down to healthy, bleeding, granular tissue.  Pre and post measurements are the same.  Minimal blood loss.  Hemostasis achieved with manual compression.  Silvadene applied followed by dressing.  Dispensed felt offloading pads to help decrease pressure.  He is in continue with Silvadene dressing changes for now we already have silver alginate dressings at home but he can start using next week. -Monitor for any clinical signs or symptoms of  infection and directed to call the office immediately should any occur or go to the ER. -Patient encouraged to call the office with any questions, concerns, change in symptoms.   Vivi Barrack DPM

## 2022-11-11 ENCOUNTER — Ambulatory Visit (INDEPENDENT_AMBULATORY_CARE_PROVIDER_SITE_OTHER): Payer: 59 | Admitting: Podiatry

## 2022-11-11 DIAGNOSIS — L97522 Non-pressure chronic ulcer of other part of left foot with fat layer exposed: Secondary | ICD-10-CM

## 2022-11-11 DIAGNOSIS — E1149 Type 2 diabetes mellitus with other diabetic neurological complication: Secondary | ICD-10-CM | POA: Diagnosis not present

## 2022-11-11 DIAGNOSIS — M2141 Flat foot [pes planus] (acquired), right foot: Secondary | ICD-10-CM

## 2022-11-11 DIAGNOSIS — M2142 Flat foot [pes planus] (acquired), left foot: Secondary | ICD-10-CM

## 2022-11-11 DIAGNOSIS — T148XXA Other injury of unspecified body region, initial encounter: Secondary | ICD-10-CM

## 2022-11-11 MED ORDER — AMOXICILLIN-POT CLAVULANATE 875-125 MG PO TABS: 1.0000 | ORAL_TABLET | Freq: Two times a day (BID) | ORAL | 0 refills | Status: AC

## 2022-11-17 NOTE — Progress Notes (Signed)
Subjective: Chief Complaint  Patient presents with   Foot Ulcer    Left foot ulcer    Flat Foot     62 year old male presents the office today for follow evaluation of ulcer left foot.  He is frustrated with the wound, how it occurred and not healing.  He has been keeping a bandage on it but he has noticed a blister or something formed next the wound.  No fevers or chills.  Objective: AAO x3, NAD DP/PT pulses palpable bilaterally, CRT less than 3 seconds Significant decrease in the arch upon weightbearing.  In the plantar medial aspect of the left foot hyperkeratotic lesion with central ulceration.  Ulceration measures 1 x 0.7 x 0.1 cm with 100% granulation tissue.  There is adjacent to this area of what appears to be a blood blister from friction which is able to debride there is a new superficial granular wound present underneath this as well.  There is no probing to bone, amount or tunneling.  No surrounding erythema, ascending cellulitis.  No pain with calf compression, swelling, warmth, erythema  Assessment: Ulceration left foot  Plan: -All treatment options discussed with the patient including all alternatives, risks, complications.  -Medically necessary wound debridement was performed today.  I sharply debrided the wound with #312 with scalpel down to healthy, bleeding, granular tissue.  Pre and post measurements are the same.  Also debrided.  The old blister just adjacent to this as well as new wound present measuring 0.4 x 0.4 x 0.1 cm.  Minimal blood loss.  Hemostasis achieved with manual compression.  Silvadene applied followed by dressing.  Dispensed felt offloading pads to help decrease pressure.  He is in continue with Silvadene dressing changes for now we already have silver alginate dressings at home but he can start using next week. -He has good palpable pulses so I did not order new circulation test. -Referral to the wound care center.  He may need a total contact cast.  He  is not able to tolerate the foot defender boot given his ankle. -Augmentin -Monitor for any clinical signs or symptoms of infection and directed to call the office immediately should any occur or go to the ER. -Patient encouraged to call the office with any questions, concerns, change in symptoms.   Vivi Barrack DPM

## 2022-11-24 ENCOUNTER — Encounter (HOSPITAL_BASED_OUTPATIENT_CLINIC_OR_DEPARTMENT_OTHER): Payer: 59 | Attending: Internal Medicine | Admitting: Physician Assistant

## 2022-11-24 DIAGNOSIS — E11621 Type 2 diabetes mellitus with foot ulcer: Secondary | ICD-10-CM | POA: Diagnosis present

## 2022-11-24 DIAGNOSIS — I10 Essential (primary) hypertension: Secondary | ICD-10-CM | POA: Diagnosis not present

## 2022-11-24 DIAGNOSIS — M21371 Foot drop, right foot: Secondary | ICD-10-CM | POA: Insufficient documentation

## 2022-11-24 DIAGNOSIS — Z86718 Personal history of other venous thrombosis and embolism: Secondary | ICD-10-CM | POA: Diagnosis not present

## 2022-11-24 DIAGNOSIS — Z7901 Long term (current) use of anticoagulants: Secondary | ICD-10-CM | POA: Diagnosis not present

## 2022-11-24 DIAGNOSIS — L97522 Non-pressure chronic ulcer of other part of left foot with fat layer exposed: Secondary | ICD-10-CM | POA: Diagnosis not present

## 2022-11-26 ENCOUNTER — Encounter (HOSPITAL_BASED_OUTPATIENT_CLINIC_OR_DEPARTMENT_OTHER): Payer: 59 | Admitting: Internal Medicine

## 2022-11-26 DIAGNOSIS — E11621 Type 2 diabetes mellitus with foot ulcer: Secondary | ICD-10-CM | POA: Diagnosis not present

## 2022-11-26 NOTE — Progress Notes (Signed)
LEWIS, KEATS ([id number]) [id number] Page 1 of 4 Visit Report for 11/24/2022 Abuse Risk Screen Details Patient Name: Date of Service: Allen Gregory NA RD 11/24/2022 2:45 PM Medical Record Number: [id number] Patient Account Number: [id number] Date of Birth/Sex: Treating RN: Aug 22, 1960 (62 y.o. Allen Gregory Primary Care Kyrie Bun: [name], NO Other Clinician: Referring Hendel Gatliff: Treating Jossilyn Benda/Extender: Diona Browner in Treatment: 0 Abuse Risk Screen Items Answer ABUSE RISK SCREEN: Has anyone close to you tried to hurt or harm you recentlyo No Do you feel uncomfortable with anyone in your familyo No Has anyone forced you do things that you didnt want to doo No Electronic Signature(s) Signed: 11/25/2022 6:21:23 PM By: Shawn Stall RN, BSN Entered By: Shawn Stall on 11/24/2022 14:46:43 -------------------------------------------------------------------------------- Activities of Daily Living Details Patient Name: Date of Service: Allen Gregory [place] RD 11/24/2022 2:45 PM Medical Record Number: [id number] Patient Account Number: [id number] Date of Birth/Sex: Treating RN: Jan 11, 1961 (62 y.o. Allen Gregory Primary Care Miachel Nardelli: [name], NO Other Clinician: Referring Travez Stancil: Treating Kassandra Meriweather/Extender: Diona Browner in Treatment: 0 Activities of Daily Living Items Answer Activities of Daily Living (Please select one for each item) Drive Automobile Completely Able T Medications ake Completely Able Use T elephone Completely Able Care for Appearance Completely Able Use T oilet Completely Able Bath / Shower Completely Able Dress Self Completely Able Feed Self Completely Able Walk Completely Able Get In / Out Bed Completely Able Housework Completely Able Prepare Meals Completely Able Handle Money Completely Able Shop for Self Completely Able Electronic Signature(s) Signed: 11/25/2022 6:21:23 PM  By: Shawn Stall RN, BSN Entered By: Shawn Stall on 11/24/2022 14:47:02 -------------------------------------------------------------------------------- Education Screening Details Patient Name: Date of Service: Allen Gregory NA RD 11/24/2022 2:45 PM Medical Record Number: [id number] Patient Account Number: [id number] Date of Birth/Sex: Treating RN: 1960/10/19 (62 y.o. Allen Gregory Primary Care Bradly Sangiovanni: PA Fidela Juneau Other Clinician: Referring Remee Charley: Treating Lorraine Cimmino/Extender: Diona Browner in Treatment: 0 [place], Darcel Bayley ([id number]) [id number] Page 2 of 4 Primary Learner Assessed: Patient Learning Preferences/Education Level/Primary Language Learning Preference: Explanation, Demonstration, Printed Material Highest Education Level: College or Above Preferred Language: Economist Language Barrier: No Translator Needed: No Memory Deficit: No Emotional Barrier: No Cultural/Religious Beliefs Affecting Medical Care: No Physical Barrier Impaired Vision: Yes Glasses, reading Impaired Hearing: No Decreased Hand dexterity: No Knowledge/Comprehension Knowledge Level: High Comprehension Level: High Ability to understand written instructions: High Ability to understand verbal instructions: High Motivation Anxiety Level: Calm Cooperation: Cooperative Education Importance: Acknowledges Need Interest in Health Problems: Asks Questions Perception: Coherent Willingness to Engage in Self-Management High Activities: Readiness to Engage in Self-Management High Activities: Electronic Signature(s) Signed: 11/25/2022 6:21:23 PM By: Shawn Stall RN, BSN Entered By: Shawn Stall on 11/24/2022 14:47:25 -------------------------------------------------------------------------------- Fall Risk Assessment Details Patient Name: Date of Service: RO Suszanne Conners NA RD 11/24/2022 2:45 PM Medical Record Number:  [id number] Patient Account Number: [id number] Date of Birth/Sex: Treating RN: 01-May-1961 (62 y.o. Allen Gregory Primary Care Maythe Deramo: [name], NO Other Clinician: Referring Velia Pamer: Treating Hawkins Seaman/Extender: Diona Browner in Treatment: 0 Fall Risk Assessment Items Have you had 2 or more falls in the last 12 monthso 0 No Have you had any fall that resulted in injury in the last 12 monthso 0 No FALLS RISK SCREEN History of falling - immediate or within 3 months 0 No Secondary diagnosis (Do you have 2 or more  medical diagnoseso) 0 No Ambulatory aid None/bed rest/wheelchair/nurse 0 Yes Crutches/cane/walker 0 No Furniture 0 No Intravenous therapy Access/Saline/Heparin Lock 0 No Gait/Transferring Normal/ bed rest/ wheelchair 0 Yes Weak (short steps with or without shuffle, stooped but able to lift head while walking, may seek 0 No support from furniture) Impaired (short steps with shuffle, may have difficulty arising from chair, head down, impaired 0 No balance) Mental Status Oriented to own ability 0 Yes Overestimates or forgets limitations 0 No Risk Level: Low Risk Score: 0 Barbe[place], Darcel Bayley ([id number]) [id number] [id number] Page 3 of 4 Electronic Signature(s) -------------------------------------------------------------------------------- Foot Assessment Details Patient Name: Date of Service: Allen Gregory [place] RD 11/24/2022 2:45 PM Medical Record Number: [id number] Patient Account Number: [id number] Date of Birth/Sex: Treating RN: 25-Dec-1960 (62 y.o. Allen Gregory Primary Care Yazlyn Wentzel: [name], NO Other Clinician: Referring Kyndra Condron: Treating Sabah Zucco/Extender: Diona Browner in Treatment: 0 Foot Assessment Items Site Locations + = Sensation present, - = Sensation absent, C = Callus, U = Ulcer R = Redness, W = Warmth, M = Maceration, PU = Pre-ulcerative lesion F = Fissure, S = Swelling, D =  Dryness Assessment Right: Left: Other Deformity: No No Prior Foot Ulcer: No No Prior Amputation: No No Charcot Joint: No No Ambulatory Status: Ambulatory Without Help Gait: Steady Electronic Signature(s) Signed: 11/25/2022 6:21:23 PM By: Shawn Stall RN, BSN Entered By: Shawn Stall on 11/24/2022 14:54:04 -------------------------------------------------------------------------------- Nutrition Risk Screening Details Patient Name: Date of Service: Allen Gregory NA RD 11/24/2022 2:45 PM Medical Record Number: [id number] Patient Account Number: [id number] Date of Birth/Sex: Treating RN: 1960-11-14 (62 y.o. Allen Gregory Primary Care Branndon Tuite: [name], NO Other Clinician: Referring Pilot Prindle: Treating Norrin Shreffler/Extender: Diona Browner in Treatment: 0 Height (in): 78 Weight (lbs): 310 Body Mass Index (BMI): 35.8 Wisinski, Antion ([id number]) [id number] Page 4 of 4 Nutrition Risk Screening Items Score Screening NUTRITION RISK SCREEN: I have an illness or condition that made me change the kind and/or amount of food I eat 2 Yes I eat fewer than two meals per day 0 No I eat few fruits and vegetables, or milk products 0 No I have three or more drinks of beer, liquor or wine almost every day 0 No I have tooth or mouth problems that make it hard for me to eat 0 No I don't always have enough money to buy the food I need 0 No I eat alone most of the time 0 No I take three or more different prescribed or over-the-counter drugs a day 1 Yes Without wanting to, I have lost or gained 10 pounds in the last six months 0 No I am not always physically able to shop, cook and/or feed myself 0 No Nutrition Protocols Good Risk Protocol Provide education on elevated blood Moderate Risk Protocol 0 sugars and impact on wound healing, as applicable High Risk Proctocol Risk Level: Moderate Risk Score: 3 Electronic Signature(s) Signed: 11/25/2022  6:21:23 PM By: Shawn Stall RN, BSN Entered By: Shawn Stall on 11/24/2022 14:47:40

## 2022-12-01 ENCOUNTER — Encounter (HOSPITAL_BASED_OUTPATIENT_CLINIC_OR_DEPARTMENT_OTHER): Payer: 59 | Admitting: Physician Assistant

## 2022-12-01 DIAGNOSIS — E11621 Type 2 diabetes mellitus with foot ulcer: Secondary | ICD-10-CM | POA: Diagnosis not present

## 2022-12-02 NOTE — Progress Notes (Addendum)
EDILSON, GAN ([id number]) [id number] Page 1 of 9 Visit Report for 12/01/2022 Chief Complaint Document Details Patient Name: Date of Service: Allen Gregory NA RD 12/01/2022 2:15 PM Medical Record Number: [id number] Patient Account Number: [id number] Date of Birth/Sex: Treating RN: 1960-11-08 (61 y.o. M) Primary Care Provider: [name], NO Other Clinician: Referring Provider: Treating Provider/Extender: Diona Browner in Treatment: 1 Information Obtained from: Patient Chief Complaint Left foot ulcer Electronic Signature(s) Signed: 12/01/2022 2:40:39 PM By: Allen Derry PA-C Entered By: Allen Derry on 12/01/2022 14:40:39 -------------------------------------------------------------------------------- Debridement Details Patient Name: Date of Service: Allen Gregory NA RD 12/01/2022 2:15 PM Medical Record Number: [id number] Patient Account Number: [id number] Date of Birth/Sex: Treating RN: January 13, 1961 (61 y.o. M) Primary Care Provider: [name], NO Other Clinician: Referring Provider: Treating Provider/Extender: Diona Browner in Treatment: 1 Debridement Performed for Assessment: Wound #5 Left,Plantar Foot Performed By: Physician Lenda Kelp, PA Debridement Type: Debridement Severity of Tissue Pre Debridement: Fat layer exposed Level of Consciousness (Pre-procedure): Awake and Alert Pre-procedure Verification/Time Out Yes - 14:38 Taken: Start Time: 14:39 Pain Control: Lidocaine 4% T opical Solution Percent of Wound Bed Debrided: 100% T Area Debrided (cm): otal 0.06 Tissue and other material debrided: Viable, Non-Viable, Callus, Subcutaneous, Skin: Dermis , Skin: Epidermis Level: Skin/Subcutaneous Tissue Debridement Description: Excisional Instrument: Curette Bleeding: Minimum Hemostasis Achieved: Silver Nitrate End Time: 14:45 Procedural Pain: 0 Post Procedural Pain: 0 Response to Treatment: Procedure  was tolerated well Level of Consciousness (Post- Awake and Alert procedure): Post Debridement Measurements of Total Wound Length: (cm) 0.4 Width: (cm) 0.2 Depth: (cm) 0.1 Volume: (cm) 0.006 Character of Wound/Ulcer Post Debridement: Improved Allen Gregory ([id number]) [id number] Page 2 of 9 Severity of Tissue Post Debridement: Fat layer exposed Post Procedure Diagnosis Same as Pre-procedure Electronic Signature(s) Signed: 12/01/2022 2:56:55 PM By: Allen Derry PA-C Previous Signature: 12/01/2022 2:56:28 PM Version By: Allen Derry PA-C Entered By: Allen Derry on 12/01/2022 14:56:54 -------------------------------------------------------------------------------- HPI Details Patient Name: Date of Service: Allen Gregory NA RD 12/01/2022 2:15 PM Medical Record Number: [id number] Patient Account Number: [id number] Date of Birth/Sex: Treating RN: 1960/10/15 (61 y.o. M) Primary Care Provider: [name], NO Other Clinician: Referring Provider: Treating Provider/Extender: Diona Browner in Treatment: 1 History of Present Illness HPI Description: ADMISSION 07/09/2019 This is a pleasant 62 year old man who referred himself here for second opinion sign a new area on the right plantar first toe and the dorsal part of his right fifth toe. He says he had these in August when he had removed callus from the first toe and then played pool volleyball for about 4 hours. He thinks the bottom of the pool simply caused an abrasion of the toe. The story sounds accurate. He has been going to podiatry for the last several months. Me a picture on his phone from August and the wound is gotten considerably smaller. He states that it started doing well recently when they started collagen. He has been using a surgical shoe to offload. 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 He had a brace for a long time but now he simply lifts his foot higher to clear but states that most people would not even notice that he had any disability. He is a type 2 diabetes Badik but he has  diet-controlled he has lost weight by watching calories. 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 07/16/2019; right fifth toe is closed. Right first toe is smaller. He is using a surgical shoe but he tells me is fairly religious about using his scooter at home. Hopefully this will give him enough offloading to close this. 2/1; right fifth toe dorsally remains closed. Right first toe still not a lot of improvement. Although superficially it looks smaller there is undermining laterally from 6-6 of at least 3 to 4 mm. Also worrisome is that the granulation does not look that healthy. We have been using silver collagen He had his foot x-rayed by his podiatrist but I do not have access to this. I am going to x-ray the right great toe again. Otherwise I am thinking he is going to require a total contact cast probably next week and I discussed this with him today. 2/9; his x-ray of the right foot did not show plain x-ray evidence of osteomyelitis. We have been using silver alginate the wound is not improved looks static. He is going to get a total contact cast today He comes in today telling me that before he left podiatry before he came here he had noninvasive arterial studies that were done by a mobile service and the podiatrist office. They are now trying to arrange I think an angiogram however this would be in [facility]. He asked my opinion on this and I told him that angiograms are done by several different doctor groups locally and if he is from [place] I cannot see a reason to go  out of [facility] for any procedure he might need. We will see if we can get the actual angiogram report and I will refer him as needed to either vein and vascular or interventional cardiology 2/12; back for his first obligatory total contact cast change. I did get his arterial studies from in [facility]. Biphasic waveforms ABI in the right of 1.16 on the left at 1.14 I am not really sure what was so concerning about this [place] I will simply repeat these studies along with TBI's locally. There should not be any need for him to go out of town to have this evaluation. I am not sure that there is a clinical need 2/16; once again the total contact cast was too loose he has multiple skin excoriations where things were rubbing. He claims to not be on this all that much and is working from home but between the size of his foot the wasting in the distal part of his lower leg and large calfs we just cannot mold the cast in to fit his leg properly.-Changed him to a forefoot off loader but with his foot drop that may not be possible. 2/23; we put him out in a surgical shoe last week as we did not have a forefoot off loader. We have this today. We are using silver alginate. He did not tolerate a total contact cast 3/1; he is in a forefoot offloading boot. Using silver collagen as of last week. No major change 3/8; he is using a forefoot offloading boot. Silver collagen over the last 2 weeks. Perhaps some reduction in depth. Moreover surface looks healthy 3/15; he is a forefoot offloading  boot. Silver collagen over the last 3 weeks. There has been reduction in depth. Much less circumferential callus and thick subcutaneous tissue. 3/22; his original wound on the plantar right great toe is somewhat improved. However he arrives in clinic today with a completely denuded ulcer on the tip of the toe distal to the original wound. More concerning is there  is loss of surface epithelium over a wide area around this even dorsally on the toe. He states that he last wrapped the area on Saturday. He was up at [facility] walking this weekend otherwise he had not done anything different. Noticeable for the fact that his forefoot offloading shoe was not in good condition fact that split open this could have had something to do with this but I have no doubt that there is also some degree of infection/cellulitis 3/29; patient came to the clinic last week with a new wound on the tip of the right great toe. This looked infected there was epithelial loss extending to the base of the first toe dorsally on the right. I gave him empiric doxycycline. Culture I did showed methicillin sensitive staph aureus Proteus and group B strep. I gave him doxycycline which is not a reliable cover of strep or Proteus. I am going to give him a course of Augmentin today. He has not been systemically unwell. 4/5; original wound on the plantar right great toe. 2 weeks ago had a new wound on the tip of the right great toe with extensive cellulitis. Culture I did at that time Allen Gregory, Allen Gregory ([id number]) [id number] Page 3 of 9 ultimately showed methicillin sensitive staph aureus Proteus and group B strep. He received some doxycycline I changed him to Augmentin last week he has 3 more days. He is complaining of some pruritus but no rash. X-ray; showed no bony abnormalities in the right great toe. 4/12; no evidence of infection. Right great toe now working on the original wound. The new wound at the tip of the toe is callused but no open area. 4/19; wound measures about the same. Roughly 3 mm of depth. He has a lot of callus on the tip of the toe where the subsequent wound was however there is no open wound here. Some callus around the circumference of the wound we have been working on 4/26; the wound that he originally had on the plantar aspect about the same.  Medially he had a new wound which he says started as an "blood blister". Obviously this has unroofed if they were true. We have been using silver alginate 5/3; the wound on the plantar toe measures smaller although he still has thick callus and skin. It is difficult to tell whether this is going to close or not. The blister that we found medially last week is also close over but not necessarily with a vibrant lady healthy looking surface 5/10; small wound on the right plantar toe still callus debris over the surface requiring debridement. We have been using silver collagen. Noteworthy that the front part of his forefoot offloading shoe is totally worn out suggesting he is really not offloading this toe adequately even using what should be a forefoot offloading shoe. I have asked him to keep his weight back on his heel 11/12/19-Patient returns at 2 weeks, right plantar toe wound with some callus debris that also required debridement, patient has a new offloading shoe that is functioning better for him. Wound appears to be smaller by using silver alginate Readmission: 11-24-2022 upon evaluation today patient  presents for reevaluation in the clinic although it has been since May 2021 since he was last seen. With that being said he has a wound on the plantar aspect of his foot which is of concern here. His most recent hemoglobin A1c was 8.5 that was March 2024. With that being said I do believe that he is working on this he tells me he is trying to get that down he needs to have ankle fusion surgery. 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 6/7; here for replacement of a total contact cast. 12-01-2022 upon evaluation today patient appears to be doing well currently in regard to his wound which is actually measuring significantly smaller compared to previous. Fortunately there does not appear to be any  signs of infection locally nor systemically which is great news and in general I do believe that removing in the appropriate direction here. Electronic Signature(s) Signed: 12/01/2022 2:54:35 PM By: Allen Derry PA-C Entered By: Allen Derry on 12/01/2022 14:54:35 -------------------------------------------------------------------------------- Physical Exam Details Patient Name: Date of Service: Allen Gregory [place] RD 12/01/2022 2:15 PM Medical Record Number: [id number] Patient Account Number: [id number] Date of Birth/Sex: Treating RN: 1960/10/13 (61 y.o. M) Primary Care Provider: [name], NO Other Clinician: Referring Provider: Treating Provider/Extender: Diona Browner in Treatment: 1 Constitutional Well-nourished and well-hydrated in no acute distress. Respiratory normal breathing without difficulty. Psychiatric this patient is able to make decisions and demonstrates good insight into disease process. Alert and Oriented x 3. pleasant and cooperative. Notes Upon inspection patient's wound bed showed signs of good granulation epithelization I did perform sharp debridement to clearway the necrotic debris he tolerated this today without complication I used some of this paring to take down some of the callus as well. Postdebridement this is significantly improved and I am very pleased with where we stand and I do think he is appropriate to continue with total contact casting. Electronic Signature(s) Signed: 12/01/2022 2:54:56 PM By: Allen Derry PA-C Entered By: Allen Derry on 12/01/2022 14:54:56 Physician Orders Details -------------------------------------------------------------------------------- Allen Gregory ([id number]) [id number] Page 4 of 9 Patient Name: Date of Service: Allen Gregory [place] RD 12/01/2022 2:15 PM Medical Record Number: [id number] Patient Account Number: [id number] Date of Birth/Sex: Treating RN: November 27, 1960 (61 y.o. Allen Gregory Primary Care Provider: [name], NO Other Clinician: Referring Provider: Treating Provider/Extender: Diona Browner in Treatment: 1 Verbal / Phone Orders: No Diagnosis Coding ICD-10 Coding Code Description E11.621 Type 2 diabetes mellitus with foot ulcer L97.522 Non-pressure chronic ulcer of other part of left foot with fat layer exposed I10 Essential (primary) hypertension M21.371 Foot drop, right foot Z86.718 Personal history of other venous thrombosis and embolism Z79.01 Long term (current) use of anticoagulants Follow-up Appointments ppointment in 1 week. - 12/08/2022 130pm Allen Gregory Wednesday room 8 Return A ppointment in 2 weeks. - 12/15/2022 215pm Allen Gregory Wednesday room 8 Return A Anesthetic (In clinic) Topical Lidocaine 4% applied to wound bed Bathing/ Shower/ Hygiene May shower with protection but do not get wound dressing(s) wet. Protect dressing(s) with water repellant cover (for example, large plastic bag) or a cast cover and may then take shower. Off-Loading Total Contact Cast to Left Lower Extremity - size 4 TCC pad the calf with foam for protection. Wound Treatment Wound #5 - Foot Wound Laterality: Plantar, Left Cleanser: Soap and Water 1 x Per Week/30 Days  Discharge Instructions: May shower and wash wound with dial antibacterial soap and water prior to dressing change. Cleanser: Wound Cleanser 1 x Per Week/30 Days Discharge Instructions: Cleanse the wound with wound cleanser prior to applying a clean dressing using gauze sponges, not tissue or cotton balls. Peri-Wound Care: Skin Prep 1 x Per Week/30 Days Discharge Instructions: Use skin prep as directed Prim Dressing: Hydrofera Blue Ready Transfer Foam, 2.5x2.5 (in/in) 1 x Per Week/30 Days ary Discharge Instructions: Apply directly to wound bed as directed Secondary Dressing: ABD Pad, 8x10 1 x Per Week/30 Days Discharge Instructions: Apply over primary dressing as directed. Secured  With: [employer], 4.5x3.1 (in/yd) 1 x Per Week/30 Days Discharge Instructions: Secure with Kerlix as directed. Secured With: 81M Medipore H Soft Cloth Surgical T ape, 4 x 10 (in/yd) 1 x Per Week/30 Days Discharge Instructions: Secure with tape as directed. Add-Ons: TCC-EZ Cast Boot, Charcot, Xlarge 1 x Per Week/30 Days Discharge Instructions: Size 4 Electronic Signature(s) Signed: 12/01/2022 4:47:25 PM By: Allen Derry PA-C Signed: 12/02/2022 5:31:32 PM By: Shawn Stall RN, BSN Entered By: Shawn Stall on 12/01/2022 14:48:29 Allen Gregory ([id number]) [id number] Page 5 of 9 -------------------------------------------------------------------------------- Problem List Details Patient Name: Date of Service: Allen Gregory [place] RD 12/01/2022 2:15 PM Medical Record Number: [id number] Patient Account Number: [id number] Date of Birth/Sex: Treating RN: Jan 15, 1961 (61 y.o. M) Primary Care Provider: [name], NO Other Clinician: Referring Provider: Treating Provider/Extender: Diona Browner in Treatment: 1 Active Problems ICD-10 Encounter Code Description Active Date MDM Diagnosis E11.621 Type 2 diabetes mellitus with foot ulcer 11/24/2022 No Yes L97.522 Non-pressure chronic ulcer of other part of left foot with fat layer exposed 11/24/2022 No Yes I10 Essential (primary) hypertension 11/24/2022 No Yes M21.371 Foot drop, right foot 11/24/2022 No Yes Z86.718 Personal history of other venous thrombosis and embolism 11/24/2022 No Yes Z79.01 Long term (current) use of anticoagulants 11/24/2022 No Yes Inactive Problems Resolved Problems Electronic Signature(s) Signed: 12/01/2022 2:40:33 PM By: Allen Derry PA-C Entered By: Allen Derry on 12/01/2022 14:40:32 -------------------------------------------------------------------------------- Progress Note Details Patient Name: Date of Service: Allen Gregory NA RD 12/01/2022 2:15 PM Medical Record Number:  [id number] Patient Account Number: [id number] Date of Birth/Sex: Treating RN: 07-29-1960 (61 y.o. M) Primary Care Provider: [name], NO Other Clinician: Referring Provider: Treating Provider/Extender: Diona Browner in Treatment: 1 Subjective Chief Complaint Information obtained from Patient Left foot ulcer History of Present Illness (HPI) ADMISSION 07/09/2019 Allen Gregory ([id number]) [id number] Page 6 of 9 This is a pleasant 62 year old man who referred himself here for second opinion sign a new area on the right plantar first toe and the dorsal part of his right fifth toe. He says he had these in August when he had removed callus from the first toe and then played pool volleyball for about 4 hours. He thinks the bottom of the pool simply caused an abrasion of the toe. The story sounds accurate. He has been going to podiatry for the last several months. Me a picture on his phone from August and the wound is gotten considerably smaller. He states that it started doing well recently when they started collagen. He has been using a surgical shoe to offload. 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 He had a brace for a long time but now  he simply lifts his foot higher to clear but states that most people would not even notice that he had any disability. He is a type 2 diabetes Badik but he has diet-controlled he has lost weight by watching calories. 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 07/16/2019; right fifth toe is closed. Right first toe is smaller. He is using a surgical shoe but he tells me is fairly religious about using his scooter at  home. Hopefully this will give him enough offloading to close this. 2/1; right fifth toe dorsally remains closed. Right first toe still not a lot of improvement. Although superficially it looks smaller there is undermining laterally from 6-6 of at least 3 to 4 mm. Also worrisome is that the granulation does not look that healthy. We have been using silver collagen He had his foot x-rayed by his podiatrist but I do not have access to this. I am going to x-ray the right great toe again. Otherwise I am thinking he is going to require a total contact cast probably next week and I discussed this with him today. 2/9; his x-ray of the right foot did not show plain x-ray evidence of osteomyelitis. We have been using silver alginate the wound is not improved looks static. He is going to get a total contact cast today He comes in today telling me that before he left podiatry before he came here he had noninvasive arterial studies that were done by a mobile service and the podiatrist office. They are now trying to arrange I think an angiogram however this would be in [facility]. He asked my opinion on this and I told him that angiograms are done by several different doctor groups locally and if he is from [place] I cannot see a reason to go out of [facility] for any procedure he might need. We will see if we can get the actual angiogram report and I will refer him as needed to either vein and vascular or interventional cardiology 2/12; back for his first obligatory total contact cast change. I did get his arterial studies from in [facility]. Biphasic waveforms ABI in the right of 1.16 on the left at 1.14 I am not really sure what was so concerning about this [place] I will simply repeat these studies along with TBI's locally. There should not be any need for him to go out of town to have this evaluation. I am not sure that there is  a clinical need 2/16; once again the total contact cast was too loose he has multiple skin excoriations where things were rubbing. He claims to not be on this all that much and is working from home but between the size of his foot the wasting in the distal part of his lower leg and large calfs we just cannot mold the cast in to fit his leg properly.-Changed him to a forefoot off loader but with his foot drop that may not be possible. 2/23; we put him out in a surgical shoe last week as we did not have a forefoot off loader. We have this today. We are using silver alginate. He did not tolerate a total contact cast 3/1; he is in a forefoot offloading boot. Using silver collagen as of last week.  No major change 3/8; he is using a forefoot offloading boot. Silver collagen over the last 2 weeks. Perhaps some reduction in depth. Moreover surface looks healthy 3/15; he is a forefoot offloading boot. Silver collagen over the last 3 weeks. There has been reduction in depth. Much less circumferential callus and thick subcutaneous tissue. 3/22; his original wound on the plantar right great toe is somewhat improved. However he arrives in clinic today with a completely denuded ulcer on the tip of the toe distal to the original wound. More concerning is there is loss of surface epithelium over a wide area around this even dorsally on the toe. He states that he last wrapped the area on Saturday. He was up at [facility] walking this weekend otherwise he had not done anything different. Noticeable for the fact that his forefoot offloading shoe was not in good condition fact that split open this could have had something to do with this but I have no doubt that there is also some degree of infection/cellulitis 3/29; patient came to the clinic last week with a new wound on the tip of the right great toe. This looked infected there was epithelial loss extending to the base of the first toe dorsally on the right. I gave him  empiric doxycycline. Culture I did showed methicillin sensitive staph aureus Proteus and group B strep. I gave him doxycycline which is not a reliable cover of strep or Proteus. I am going to give him a course of Augmentin today. He has not been systemically unwell. 4/5; original wound on the plantar right great toe. 2 weeks ago had a new wound on the tip of the right great toe with extensive cellulitis. Culture I did at that time ultimately showed methicillin sensitive staph aureus Proteus and group B strep. He received some doxycycline I changed him to Augmentin last week he has 3 more days. He is complaining of some pruritus but no rash. X-ray; showed no bony abnormalities in the right great toe. 4/12; no evidence of infection. Right great toe now working on the original wound. The new wound at the tip of the toe is callused but no open area. 4/19; wound measures about the same. Roughly 3 mm of depth. He has a lot of callus on the tip of the toe where the subsequent wound was however there is no open wound here. Some callus around the circumference of the wound we have been working on 4/26; the wound that he originally had on the plantar aspect about the same. Medially he had a new wound which he says started as an "blood blister". Obviously this has unroofed if they were true. We have been using silver alginate 5/3; the wound on the plantar toe measures smaller although he still has thick callus and skin. It is difficult to tell whether this is going to close or not. The blister that we found medially last week is also close over but not necessarily with a vibrant lady healthy looking surface 5/10; small wound on the right plantar toe still callus debris over the surface requiring debridement. We have been using silver collagen. Noteworthy that the front part of his forefoot offloading shoe is totally worn out suggesting he is really not offloading this toe adequately even using what should be a  forefoot offloading shoe. I have asked him to keep his weight back on his heel 11/12/19-Patient returns at 2 weeks, right plantar toe wound with some callus debris that also required debridement, patient has  a new offloading shoe that is functioning better for him. Wound appears to be smaller by using silver alginate Readmission: 11-24-2022 upon evaluation today patient presents for reevaluation in the clinic although it has been since May 2021 since he was last seen. With that being said he has a wound on the plantar aspect of his foot which is of concern here. His most recent hemoglobin A1c was 8.5 that was March 2024. With that being said I do believe that he is working on this he tells me he is trying to get that down he needs to have ankle fusion surgery. 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 6/7; here for replacement of a total contact cast. 12-01-2022 upon evaluation today patient appears to be doing well currently in regard to his wound which is actually measuring significantly smaller compared to previous. Fortunately there does not appear to be any signs of infection locally nor systemically which is great news and in general I do believe that removing in the appropriate direction here. [address] Allen Gregory, Allen Gregory ([id number]) [id number] Page 7 of 9 Constitutional Well-nourished and well-hydrated in no acute distress. Vitals Time Taken: 2:19 PM, Height: 78 in, Weight: 310 lbs, BMI: 35.8, Temperature: 98.1 F, Pulse: 92 bpm, Respiratory Rate: 18 breaths/min, Blood Pressure: 133/81 mmHg. Respiratory normal breathing without difficulty. Psychiatric this patient is able to make decisions and demonstrates good insight into disease process. Alert and Oriented x 3. pleasant and cooperative. General Notes: Upon inspection patient's wound bed showed signs of good  granulation epithelization I did perform sharp debridement to clearway the necrotic debris he tolerated this today without complication I used some of this paring to take down some of the callus as well. Postdebridement this is significantly improved and I am very pleased with where we stand and I do think he is appropriate to continue with total contact casting. Integumentary (Hair, Skin) Wound #5 status is Open. Original cause of wound was Blister. The date acquired was: 10/21/2022. The wound has been in treatment 1 weeks. The wound is located on the Left,Plantar Foot. The wound measures 0.1cm length x 0.1cm width x 0.1cm depth; 0.008cm^2 area and 0.001cm^3 volume. There is Fat Layer (Subcutaneous Tissue) exposed. There is a medium amount of serosanguineous drainage noted. The wound margin is distinct with the outline attached to the wound base. There is large (67-100%) red granulation within the wound bed. There is no necrotic tissue within the wound bed. The periwound skin appearance exhibited: Callus, Dry/Scaly. The periwound skin appearance did not exhibit: Crepitus, Excoriation, Induration, Rash, Scarring, Maceration, Atrophie Blanche, Cyanosis, Ecchymosis, Hemosiderin Staining, Mottled, Pallor, Rubor, Erythema. Assessment Active Problems ICD-10 Type 2 diabetes mellitus with foot ulcer Non-pressure chronic ulcer of other part of left foot with fat layer exposed Essential (primary) hypertension Foot drop, right foot Personal history of other venous thrombosis and embolism Long term (current) use of anticoagulants Procedures Wound #5 Pre-procedure diagnosis of Wound #5 is a Diabetic Wound/Ulcer of the Lower Extremity located on the Left,Plantar Foot .Severity of Tissue Pre Debridement is: Fat layer exposed. There was a Excisional Skin/Subcutaneous Tissue Debridement with a total area of 0.06 sq cm performed by Lenda Kelp, PA. With the following instrument(s): Curette to remove Viable  and Non-Viable tissue/material. Material removed includes Callus, Subcutaneous Tissue, Skin: Dermis, and Skin: Epidermis after achieving pain control using Lidocaine 4% Topical Solution. A time out was conducted at  14:38, prior to the start of the procedure. A Minimum amount of bleeding was controlled with Silver Nitrate. The procedure was tolerated well with a pain level of 0 throughout and a pain level of 0 following the procedure. Post Debridement Measurements: 0.4cm length x 0.2cm width x 0.1cm depth; 0.006cm^3 volume. Character of Wound/Ulcer Post Debridement is improved. Severity of Tissue Post Debridement is: Fat layer exposed. Post procedure Diagnosis Wound #5: Same as Pre-Procedure Pre-procedure diagnosis of Wound #5 is a Diabetic Wound/Ulcer of the Lower Extremity located on the Left,Plantar Foot . There was a T Contact Cast otal Procedure by Lenda Kelp, PA. Post procedure Diagnosis Wound #5: Same as Pre-Procedure Notes: size 4. Plan Follow-up Appointments: Return Appointment in 1 week. - 12/08/2022 130pm Allen Gregory Wednesday room 8 Return Appointment in 2 weeks. - 12/15/2022 215pm Allen Gregory Wednesday room 8 Anesthetic: (In clinic) Topical Lidocaine 4% applied to wound bed Bathing/ Shower/ Hygiene: May shower with protection but do not get wound dressing(s) wet. Protect dressing(s) with water repellant cover (for example, large plastic bag) or a cast cover and may then take shower. Off-Loading: T Contact Cast to Left Lower Extremity - size 4 TCC pad the calf with foam for protection. otal WOUND #5: - Foot Wound Laterality: Plantar, Left Cleanser: Soap and Water 1 x Per Week/30 Days Discharge Instructions: May shower and wash wound with dial antibacterial soap and water prior to dressing change. Cleanser: Wound Cleanser 1 x Per Week/30 Days Discharge Instructions: Cleanse the wound with wound cleanser prior to applying a clean dressing using gauze sponges, not tissue or cotton  balls. Peri-Wound Care: Skin Prep 1 x Per Week/30 Days Discharge Instructions: Use skin prep as directed Prim Dressing: Hydrofera Blue Ready Transfer Foam, 2.5x2.5 (in/in) 1 x Per Week/30 Days Allen Gregory, Allen Gregory ([id number]) [id number] Page 8 of 9 Discharge Instructions: Apply directly to wound bed as directed Secondary Dressing: ABD Pad, 8x10 1 x Per Week/30 Days Discharge Instructions: Apply over primary dressing as directed. Secured With: [employer], 4.5x3.1 (in/yd) 1 x Per Week/30 Days Discharge Instructions: Secure with Kerlix as directed. Secured With: 53M Medipore H Soft Cloth Surgical T ape, 4 x 10 (in/yd) 1 x Per Week/30 Days Discharge Instructions: Secure with tape as directed. Add-Ons: TCC-EZ Cast Boot, Charcot, Xlarge 1 x Per Week/30 Days Discharge Instructions: Size 4 1. I did reapply the total contact cast today the patient tolerated this without any pain or complication and post application he seems to be doing quite well. Will get a going continue with the treatment plan once I heal him my plan is to keep him in the cast for 1 additional week in order to make sure that he as well. 2. I am good recommend as well that the patient should have some custom shoes. I again my plan is to have him go back to [facility] for them to see about getting in some custom orthotics. This should be a primary concern in my opinion. Otherwise he is going to continue to breakdown. We will see patient back for reevaluation in 1 week here in the clinic. If anything worsens or changes patient will contact our office for additional recommendations. Electronic Signature(s) Signed: 12/01/2022 2:57:09 PM By: Allen Derry PA-C Previous Signature: 12/01/2022 2:55:39 PM Version By: Allen Derry PA-C Entered By: Allen Derry on 12/01/2022 14:57:08 -------------------------------------------------------------------------------- Total Contact Cast Details Patient Name:  Date of Service: Allen Gregory [address] 12/01/2022 2:15 PM Medical Record Number: [id number] Patient Account  Number: [id number] Date of Birth/Sex: Treating RN: 07-Nov-1960 (61 y.o. Allen Gregory Primary Care Provider: [name], NO Other Clinician: Referring Provider: Treating Provider/Extender: Diona Browner in Treatment: 1 T Contact Cast Applied for Wound Assessment: otal Wound #5 Left,Plantar Foot Performed By: Physician Lenda Kelp, PA Post Procedure Diagnosis Same as Pre-procedure Notes size 4 Electronic Signature(s) Signed: 12/01/2022 4:47:25 PM By: Allen Derry PA-C Signed: 12/02/2022 5:31:32 PM By: Shawn Stall RN, BSN Entered By: Shawn Stall on 12/01/2022 14:44:06 -------------------------------------------------------------------------------- SuperBill Details Patient Name: Date of Service: Allen Gregory NA RD 12/01/2022 Medical Record Number: [id number] Patient Account Number: [id number] Date of Birth/Sex: Treating RN: 03-04-1961 (61 y.o. Allen Gregory Primary Care Provider: [name], NO Other Clinician: Referring Provider: Treating Provider/Extender: Diona Browner in Treatment: 1 Diagnosis Coding ICD-10 Codes Allen Gregory, Allen Gregory ([id number]) [id number] Page 9 of 9 Code Description E11.621 Type 2 diabetes mellitus with foot ulcer L97.522 Non-pressure chronic ulcer of other part of left foot with fat layer exposed I10 Essential (primary) hypertension M21.371 Foot drop, right foot Z86.718 Personal history of other venous thrombosis and embolism Z79.01 Long term (current) use of anticoagulants Facility Procedures : CPT4 Code: 08657846 Description: 11042 - DEB SUBQ TISSUE 20 SQ CM/< ICD-10 Diagnosis Description L97.522 Non-pressure chronic ulcer of other part of left foot with fat layer exposed Modifier: Quantity: 1 Physician Procedures : CPT4 Code Description Modifier [id number] 11042 - WC PHYS  SUBQ TISS 20 SQ CM ICD-10 Diagnosis Description L97.522 Non-pressure chronic ulcer of other part of left foot with fat layer exposed Quantity: 1 Electronic Signature(s) Signed: 12/01/2022 2:57:19 PM By: Allen Derry PA-C Entered By: Allen Derry on 12/01/2022 14:57:19

## 2022-12-03 ENCOUNTER — Ambulatory Visit: Payer: 59 | Admitting: Podiatry

## 2022-12-04 NOTE — Progress Notes (Signed)
Allen Gregory ([id number]) [id number] Page 1 of 6 Visit Report for 12/01/2022 Arrival Information Details Patient Name: Date of Service: Allen Gregory [address] 12/01/2022 2:15 PM Medical Record Number: [id number] Patient Account Number: [id number] Date of Birth/Sex: Treating RN: 06-Apr-1961 (61 y.o. M) Primary Care Luci Bellucci: [name], NO Other Clinician: Referring Lakeyia Surber: Treating Vadie Principato/Extender: Diona Browner in Treatment: 1 Visit Information History Since Last Visit Added or deleted any medications: No Patient Arrived: Ambulatory Any new allergies or adverse reactions: No Arrival Time: 14:18 Had a fall or experienced change in No Accompanied By: self activities of daily living that may affect Transfer Assistance: None risk of falls: Patient Identification Verified: Yes Signs or symptoms of abuse/neglect since last No Secondary Verification Process Completed: Yes visito Patient Requires Transmission-Based Precautions: No Hospitalized since last visit: No Patient Has Alerts: Yes Has Dressing in Place as Prescribed: Yes Patient Alerts: Patient on Blood Thinner Has Footwear/Offloading in Place as Yes Prescribed: Left: Removable Cast Walker/Walking Boot T Contact Cast otal Pain Present Now: No Electronic Signature(s) Signed: 12/01/2022 4:21:40 PM By: Thayer Dallas Entered By: Thayer Dallas on 12/01/2022 14:19:00 -------------------------------------------------------------------------------- Encounter Discharge Information Details Patient Name: Date of Service: Allen Gregory NA RD 12/01/2022 2:15 PM Medical Record Number: [id number] Patient Account Number: [id number] Date of Birth/Sex: Treating RN: [birth year] (61 y.o. Allen Gregory Primary Care Stacye Noori: [name], NO Other Clinician: Referring Kemaya Dorner: Treating Clever Geraldo/Extender: Diona Browner in Treatment: 1 Encounter Discharge Information Items  Post Procedure Vitals Discharge Condition: Stable Temperature (F): 98.1 Ambulatory Status: Ambulatory Pulse (bpm): 92 Discharge Destination: Home Respiratory Rate (breaths/min): 20 Transportation: Private Auto Blood Pressure (mmHg): 133/81 Accompanied By: girlfriend Schedule Follow-up Appointment: Yes Clinical Summary of Care: Electronic Signature(s) Signed: 12/02/2022 5:31:32 PM By: Shawn Stall RN, BSN Entered By: Shawn Stall on 12/01/2022 14:49:51 Allen Gregory ([id number]) [id number] Page 2 of 6 -------------------------------------------------------------------------------- Lower Extremity Assessment Details Patient Name: Date of Service: Allen Gregory [place] RD 12/01/2022 2:15 PM Medical Record Number: [id number] Patient Account Number: [id number] Date of Birth/Sex: Treating RN: [birth year] (61 y.o. M) Primary Care Izaya Netherton: [name], NO Other Clinician: Referring Joyous Gleghorn: Treating Wyllow Seigler/Extender: Diona Browner in Treatment: 1 Edema Assessment Assessed: [Left: No] [Right: No] Edema: [Left: Ye] [Right: s] Calf Left: Right: Point of Measurement: 32 cm From Medial Instep 49 cm Ankle Left: Right: Point of Measurement: 12 cm From Medial Instep 26.5 cm Electronic Signature(s) Signed: 12/01/2022 4:21:40 PM By: Thayer Dallas Entered By: Thayer Dallas on 12/01/2022 14:25:31 -------------------------------------------------------------------------------- Multi-Disciplinary Care Plan Details Patient Name: Date of Service: Allen Gregory NA RD 12/01/2022 2:15 PM Medical Record Number: [id number] Patient Account Number: [id number] Date of Birth/Sex: Treating RN: 1961/06/12 (61 y.o. Allen Gregory Primary Care Akyla Vavrek: [name], NO Other Clinician: Referring Camry Robello: Treating Asyah Candler/Extender: Diona Browner in Treatment: 1 Active Inactive Pain, Acute or Chronic Nursing Diagnoses: Pain, acute or  chronic: actual or potential Potential alteration in comfort, pain Goals: Patient will verbalize adequate pain control and receive pain control interventions during procedures as needed Date Initiated: 11/24/2022 Target Resolution Date: 02/19/2023 Goal Status: Active Patient/caregiver will verbalize comfort level met Date Initiated: 11/24/2022 Target Resolution Date: 02/19/2023 Goal Status: Active Interventions: Encourage patient to take pain medications as prescribed Provide education on pain management Treatment Activities: Administer pain control measures as ordered : 11/24/2022 Allen Gregory ([id number]) [id number] Page 3 of 6 Notes: Wound/Skin Impairment Nursing Diagnoses: Knowledge deficit related  to ulceration/compromised skin integrity Goals: Patient/caregiver will verbalize understanding of skin care regimen Date Initiated: 11/24/2022 Target Resolution Date: 02/19/2023 Goal Status: Active Interventions: Assess patient/caregiver ability to perform ulcer/skin care regimen upon admission and as needed Assess ulceration(s) every visit Provide education on ulcer and skin care Treatment Activities: Skin care regimen initiated : 11/24/2022 Topical wound management initiated : 11/24/2022 Notes: Electronic Signature(s) Signed: 12/02/2022 5:31:32 PM By: Shawn Stall RN, BSN Entered By: Shawn Stall on 12/01/2022 14:42:33 -------------------------------------------------------------------------------- Pain Assessment Details Patient Name: Date of Service: Allen Gregory NA RD 12/01/2022 2:15 PM Medical Record Number: [id number] Patient Account Number: [id number] Date of Birth/Sex: Treating RN: 01-17-1961 (61 y.o. M) Primary Care Marnette Perkins: [name], NO Other Clinician: Referring Shamond Skelton: Treating Aleka Twitty/Extender: Diona Browner in Treatment: 1 Active Problems Location of Pain Severity and Description of Pain Patient Has Paino No Site  Locations Pain Management and Medication Current Pain Management: Electronic Signature(s) Signed: 12/01/2022 4:21:40 PM By: Thayer Dallas Entered By: Thayer Dallas on 12/01/2022 14:19:50 Allen Gregory ([id number]) [id number] Page 4 of 6 -------------------------------------------------------------------------------- Patient/Caregiver Education Details Patient Name: Date of Service: Allen Gregory [place] RD 6/12/2024andnbsp2:15 PM Medical Record Number: [id number] Patient Account Number: [id number] Date of Birth/Gender: Treating RN: 29-Jul-1960 (62 y.o. Allen Gregory Primary Care Physician: [name], NO Other Clinician: Referring Physician: Treating Physician/Extender: Diona Browner in Treatment: 1 Education Assessment Education Provided To: Patient Education Topics Provided Wound/Skin Impairment: Handouts: Caring for Your Ulcer Methods: Explain/Verbal Responses: Reinforcements needed Electronic Signature(s) Signed: 12/02/2022 5:31:32 PM By: Shawn Stall RN, BSN Entered By: Shawn Stall on 12/01/2022 14:43:47 -------------------------------------------------------------------------------- Wound Assessment Details Patient Name: Date of Service: Allen Gregory NA RD 12/01/2022 2:15 PM Medical Record Number: [id number] Patient Account Number: [id number] Date of Birth/Sex: Treating RN: 1961-02-26 (61 y.o. M) Primary Care Keriann Rankin: [name], NO Other Clinician: Referring Elbert Polyakov: Treating Marili Vader/Extender: Diona Browner in Treatment: 1 Wound Status Wound Number: 5 Primary Diabetic Wound/Ulcer of the Lower Extremity Etiology: Wound Location: Left, Plantar Foot Wound Status: Open Wounding Event: Blister Comorbid Pneumothorax, Deep Vein Thrombosis, Type II Diabetes, Date Acquired: 10/21/2022 History: Neuropathy Weeks Of Treatment: 1 Clustered Wound: No Photos Wound Measurements Keidel, Darcel Bayley  ([id number]) Length: (cm) 0.1 Width: (cm) 0.1 Depth: (cm) 0.1 Area: (cm) 0.008 Volume: (cm) 0.001 [id number] Page 5 of 6 % Reduction in Area: 99.8% % Reduction in Volume: 99.9% Epithelialization: Medium (34-66%) Wound Description Classification: Unable to visualize wound bed Wound Margin: Distinct, outline attached Exudate Amount: Medium Exudate Type: Serosanguineous Exudate Color: red, brown Foul Odor After Cleansing: No Slough/Fibrino No Wound Bed Granulation Amount: Large (67-100%) Exposed Structure Granulation Quality: Red Fascia Exposed: No Necrotic Amount: None Present (0%) Fat Layer (Subcutaneous Tissue) Exposed: Yes Tendon Exposed: No Muscle Exposed: No Joint Exposed: No Bone Exposed: No Periwound Skin Texture Texture Color No Abnormalities Noted: No No Abnormalities Noted: No Callus: Yes Atrophie Blanche: No Crepitus: No Cyanosis: No Excoriation: No Ecchymosis: No Induration: No Erythema: No Rash: No Hemosiderin Staining: No Scarring: No Mottled: No Pallor: No Moisture Rubor: No No Abnormalities Noted: No Dry / Scaly: Yes Maceration: No Treatment Notes Wound #5 (Foot) Wound Laterality: Plantar, Left Cleanser Soap and Water Discharge Instruction: May shower and wash wound with dial antibacterial soap and water prior to dressing change. Wound Cleanser Discharge Instruction: Cleanse the wound with wound cleanser prior to applying a clean dressing using gauze sponges, not tissue or cotton balls. Peri-Wound Care Skin Prep  Discharge Instruction: Use skin prep as directed Topical Primary Dressing Hydrofera Blue Ready Transfer Foam, 2.5x2.5 (in/in) Discharge Instruction: Apply directly to wound bed as directed Secondary Dressing ABD Pad, 8x10 Discharge Instruction: Apply over primary dressing as directed. Secured With [employer], 4.5x3.1 (in/yd) Discharge Instruction: Secure with Kerlix as directed. 48M Medipore  H Soft Cloth Surgical T ape, 4 x 10 (in/yd) Discharge Instruction: Secure with tape as directed. Compression Wrap Compression Stockings Add-Ons TCC-EZ Cast [place] Pellant Discharge Instruction: Size 4 [place], Darcel Bayley ([id number]) [id number] Page 6 of 6 Electronic Signature(s) Signed: 12/01/2022 4:21:40 PM By: Thayer Dallas Entered By: Thayer Dallas on 12/01/2022 14:30:51 -------------------------------------------------------------------------------- Vitals Details Patient Name: Date of Service: Allen Gregory NA RD 12/01/2022 2:15 PM Medical Record Number: [id number] Patient Account Number: [id number] Date of Birth/Sex: Treating RN: [birth year] (61 y.o. M) Primary Care Holden Maniscalco: [name], NO Other Clinician: Referring Sarahanne Novakowski: Treating Lonisha Bobby/Extender: Diona Browner in Treatment: 1 Vital Signs Time Taken: 14:19 Temperature (F): 98.1 Height (in): 78 Pulse (bpm): 92 Weight (lbs): 310 Respiratory Rate (breaths/min): 18 Body Mass Index (BMI): 35.8 Blood Pressure (mmHg): 133/81 Reference Range: 80 - 120 mg / dl Electronic Signature(s) Signed: 12/01/2022 4:21:40 PM By: Thayer Dallas Entered By: Thayer Dallas on 12/01/2022 14:19:37

## 2022-12-06 ENCOUNTER — Encounter (HOSPITAL_BASED_OUTPATIENT_CLINIC_OR_DEPARTMENT_OTHER): Payer: 59 | Admitting: Internal Medicine

## 2022-12-08 ENCOUNTER — Encounter (HOSPITAL_BASED_OUTPATIENT_CLINIC_OR_DEPARTMENT_OTHER): Payer: 59 | Admitting: Physician Assistant

## 2022-12-08 DIAGNOSIS — E11621 Type 2 diabetes mellitus with foot ulcer: Secondary | ICD-10-CM | POA: Diagnosis not present

## 2022-12-08 NOTE — Progress Notes (Addendum)
NAKAI, POLLIO ([id number]) [id number] Page 1 of 8 Visit Report for 12/08/2022 Chief Complaint Document Details Patient Name: Date of Service: Allen Gregory NA RD 12/08/2022 1:30 PM Medical Record Number: [id number] Patient Account Number: [id number] Date of Birth/Sex: Treating RN: Aug 06, 1960 (62 y.o. M) Primary Care Provider: [name], NO Other Clinician: Referring Provider: Treating Provider/Extender: Diona Browner in Treatment: 2 Information Obtained from: Patient Chief Complaint Left foot ulcer Electronic Signature(s) Signed: 12/08/2022 1:37:45 PM By: Allen Derry PA-C Entered By: Allen Derry on 12/08/2022 13:37:45 -------------------------------------------------------------------------------- HPI Details Patient Name: Date of Service: Allen Gregory NA RD 12/08/2022 1:30 PM Medical Record Number: [id number] Patient Account Number: [id number] Date of Birth/Sex: Treating RN: 1960/07/26 (62 y.o. M) Primary Care Provider: [name], NO Other Clinician: Referring Provider: Treating Provider/Extender: Diona Browner in Treatment: 2 History of Present Illness HPI Description: ADMISSION 07/09/2019 This is a pleasant 62 year old man who referred himself here for second opinion sign a new area on the right plantar first toe and the dorsal part of his right fifth toe. He says he had these in August when he had removed callus from the first toe and then played pool volleyball for about 4 hours. He thinks the bottom of the pool simply caused an abrasion of the toe. The story sounds accurate. He has been going to podiatry for the last several months. Me a picture on his phone from August and the wound is gotten considerably smaller. He states that it started doing well recently when they started collagen. He has been using a surgical shoe to offload. 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 He had a brace for a long time but now he simply lifts his foot higher to clear but states that most people would not even notice that he had any disability. He is a type 2 diabetes Badik but he has diet-controlled he has lost weight by watching calories. 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 07/16/2019; right fifth toe is closed. Right first toe is smaller. He is using a surgical shoe but he tells me is fairly religious about using his scooter at home. Hopefully this will give him enough offloading to close this. 2/1; right fifth toe dorsally remains closed. Right first toe still not a lot of improvement. Although superficially it looks smaller there is undermining laterally from 6-6 of at least 3 to 4 mm. Also worrisome is that the granulation does not look that healthy. We have been using silver collagen He had his foot x-rayed by his podiatrist but I do not have access to this. I am going to x-ray the right great toe again. Otherwise I am thinking he is going to require a total contact cast probably next week and I discussed this with him today. 2/9; his x-ray of the right foot did not show plain x-ray evidence of osteomyelitis. We have been using silver alginate the wound is not improved looks static. He is going to get a total contact cast today He comes in today telling me that before he left podiatry before he came here he had noninvasive arterial studies that were done by a  mobile service and the podiatrist office. They are now trying to arrange I think an angiogram however this would be in [facility]. He asked my opinion on this and I told him that angiograms are done by several different doctor groups locally  and if he is from [place] I cannot see a reason to go out of [facility] for any procedure he might need. We will see if we can get the actual angiogram report and I will refer him as needed to either vein and vascular or interventional cardiology Kizzie Furnish ([id number]) [id number] Page 2 of 8 2/12; back for his first obligatory total contact cast change. I did get his arterial studies from in [facility]. Biphasic waveforms ABI in the right of 1.16 on the left at 1.14 I am not really sure what was so concerning about this [place] I will simply repeat these studies along with TBI's locally. There should not be any need for him to go out of town to have this evaluation. I am not sure that there is a clinical need 2/16; once again the total contact cast was too loose he has multiple skin excoriations where things were rubbing. He claims to not be on this all that much and is working from home but between the size of his foot the wasting in the distal part of his lower leg and large calfs we just cannot mold the cast in to fit his leg properly.-Changed him to a forefoot off loader but with his foot drop that may not be possible. 2/23; we put him out in a surgical shoe last week as we did not have a forefoot off loader. We have this today. We are using silver alginate. He did not tolerate a total contact cast 3/1; he is in a forefoot offloading boot. Using silver collagen as of last week. No major change 3/8; he is using a forefoot offloading boot. Silver collagen over the last 2 weeks. Perhaps some reduction in depth. Moreover surface looks healthy 3/15; he is a forefoot offloading boot. Silver collagen over the last 3 weeks. There has been reduction in depth. Much less circumferential callus and thick subcutaneous tissue. 3/22; his original wound on the plantar right great toe is somewhat improved. However  he arrives in clinic today with a completely denuded ulcer on the tip of the toe distal to the original wound. More concerning is there is loss of surface epithelium over a wide area around this even dorsally on the toe. He states that he last wrapped the area on Saturday. He was up at [facility] walking this weekend otherwise he had not done anything different. Noticeable for the fact that his forefoot offloading shoe was not in good condition fact that split open this could have had something to do with this but I have no doubt that there is also some degree of infection/cellulitis 3/29; patient came to the clinic last week with a new wound on the tip of the right great toe. This looked infected there was epithelial loss extending to the base of the first toe dorsally on the right. I gave him empiric doxycycline. Culture I did showed methicillin sensitive staph aureus Proteus and group B strep. I gave him doxycycline which is not a reliable cover of strep or Proteus. I am going to give him a course of Augmentin today. He has not been systemically unwell. 4/5; original wound on the  plantar right great toe. 2 weeks ago had a new wound on the tip of the right great toe with extensive cellulitis. Culture I did at that time ultimately showed methicillin sensitive staph aureus Proteus and group B strep. He received some doxycycline I changed him to Augmentin last week he has 3 more days. He is complaining of some pruritus but no rash. X-ray; showed no bony abnormalities in the right great toe. 4/12; no evidence of infection. Right great toe now working on the original wound. The new wound at the tip of the toe is callused but no open area. 4/19; wound measures about the same. Roughly 3 mm of depth. He has a lot of callus on the tip of the toe where the subsequent wound was however there is no open wound here. Some callus around the circumference of the wound we have been working on 4/26; the wound that he  originally had on the plantar aspect about the same. Medially he had a new wound which he says started as an "blood blister". Obviously this has unroofed if they were true. We have been using silver alginate 5/3; the wound on the plantar toe measures smaller although he still has thick callus and skin. It is difficult to tell whether this is going to close or not. The blister that we found medially last week is also close over but not necessarily with a vibrant lady healthy looking surface 5/10; small wound on the right plantar toe still callus debris over the surface requiring debridement. We have been using silver collagen. Noteworthy that the front part of his forefoot offloading shoe is totally worn out suggesting he is really not offloading this toe adequately even using what should be a forefoot offloading shoe. I have asked him to keep his weight back on his heel 11/12/19-Patient returns at 2 weeks, right plantar toe wound with some callus debris that also required debridement, patient has a new offloading shoe that is functioning better for him. Wound appears to be smaller by using silver alginate Readmission: 11-24-2022 upon evaluation today patient presents for reevaluation in the clinic although it has been since May 2021 since he was last seen. With that being said he has a wound on the plantar aspect of his foot which is of concern here. His most recent hemoglobin A1c was 8.5 that was March 2024. With that being said I do believe that he is working on this he tells me he is trying to get that down he needs to have ankle fusion surgery. 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 6/7; here for replacement of a total contact cast. 12-01-2022 upon evaluation today patient appears to be doing well currently in regard to his wound which is actually measuring significantly smaller compared  to previous. Fortunately there does not appear to be any signs of infection locally nor systemically which is great news and in general I do believe that removing in the appropriate direction here. 12-08-2022 upon evaluation today patient appears to be doing well currently in regard to the wound on his foot. The cast seems to be doing an excellent job and in general I think that we are actually very close to complete resolution. I am extremely pleased in that regard. Electronic Signature(s) Signed: 12/14/2022 8:42:17 AM By: Allen Derry PA-C Entered By: Allen Derry on 12/14/2022 08:42:17 -------------------------------------------------------------------------------- Physical Exam Details Patient Name: Date of Service:  Allen SO L, LEO NA RD 12/08/2022 1:30 PM Medical Record Number: [id number] Patient Account Number: [id number] Date of Birth/Sex: Treating RN: 12-Jul-1960 (61 y.o. M) Primary Care Provider: [name], NO Other Clinician: Referring Provider: Treating Provider/Extender: Diona Browner in Treatment: 2 Constitutional Well-nourished and well-hydrated in no acute distress. Respiratory normal breathing without difficulty. HUE, FRICK ([id number]) [id number] Page 3 of 8 Psychiatric this patient is able to make decisions and demonstrates good insight into disease process. Alert and Oriented x 3. pleasant and cooperative. Notes Patient's wound bed actually showed signs of good granulation epithelization at this point. Fortunately I do not see any evidence of active infection locally or systemically which is great news and I think the cast is doing awesome job. Electronic Signature(s) Signed: 12/14/2022 8:42:33 AM By: Allen Derry PA-C Entered By: Allen Derry on 12/14/2022 08:42:33 -------------------------------------------------------------------------------- Physician Orders Details Patient Name: Date of Service: Allen Gregory NA RD  12/08/2022 1:30 PM Medical Record Number: [id number] Patient Account Number: [id number] Date of Birth/Sex: Treating RN: 18-Dec-1960 (61 y.o. [name] Primary Care Provider: [name], NO Other Clinician: Referring Provider: Treating Provider/Extender: Diona Browner in Treatment: 2 Verbal / Phone Orders: No Diagnosis Coding ICD-10 Coding Code Description E11.621 Type 2 diabetes mellitus with foot ulcer L97.522 Non-pressure chronic ulcer of other part of left foot with fat layer exposed I10 Essential (primary) hypertension M21.371 Foot drop, right foot Z86.718 Personal history of other venous thrombosis and embolism Z79.01 Long term (current) use of anticoagulants Follow-up Appointments ppointment in 1 week. - 12/15/2022 215pm [place] Wednesday room 8 Castw Return A Anesthetic (In clinic) Topical Lidocaine 4% applied to wound bed Bathing/ Shower/ Hygiene May shower with protection but do not get wound dressing(s) wet. Protect dressing(s) with water repellant cover (for example, large plastic bag) or a cast cover and may then take shower. Off-Loading Total Contact Cast to Left Lower Extremity - size 4 TCC pad the calf with foam for protection. Wound Treatment Wound #5 - Foot Wound Laterality: Plantar, Left Cleanser: Soap and Water 1 x Per Week/30 Days Discharge Instructions: May shower and wash wound with dial antibacterial soap and water prior to dressing change. Cleanser: Wound Cleanser 1 x Per Week/30 Days Discharge Instructions: Cleanse the wound with wound cleanser prior to applying a clean dressing using gauze sponges, not tissue or cotton balls. Peri-Wound Care: Skin Prep 1 x Per Week/30 Days Discharge Instructions: Use skin prep as directed Prim Dressing: Maxorb Extra Calcium Alginate, 2x2 (in/in) 1 x Per Week/30 Days ary Discharge Instructions: Apply to wound bed as instructed Secondary Dressing: ABD Pad, 8x10 1 x Per Week/30 Days Discharge  Instructions: Apply over primary dressing as directed. Secured With: [employer], 4.5x3.1 (in/yd) 1 x Per Week/30 Days Discharge Instructions: Secure with Kerlix as directed. Secured With: 26M Medipore H Soft Cloth Surgical Tape, 4 x 10 (in/yd) 1 x Per Week/30 Days BOBBYJOE, PABST ([id number]) [id number] Page 4 of 8 Discharge Instructions: Secure with tape as directed. Add-Ons: TCC-EZ Cast Boot, Charcot, Xlarge 1 x Per Week/30 Days Discharge Instructions: Size 4 Electronic Signature(s) Signed: 12/08/2022 3:27:24 PM By: Shawn Stall RN, BSN Signed: 12/09/2022 5:52:49 PM By: Allen Derry PA-C Entered By: Shawn Stall on 12/08/2022 14:11:08 -------------------------------------------------------------------------------- Problem List Details Patient Name: Date of Service: Allen Gregory NA RD 12/08/2022 1:30 PM Medical Record Number: [id number] Patient Account Number: [id number] Date of Birth/Sex: Treating RN: 10-26-1960 (61 y.o. [name] Primary Care Provider:  [name], NO Other Clinician: Referring Provider: Treating Provider/Extender: Diona Browner in Treatment: 2 Active Problems ICD-10 Encounter Code Description Active Date MDM Diagnosis E11.621 Type 2 diabetes mellitus with foot ulcer 11/24/2022 No Yes L97.522 Non-pressure chronic ulcer of other part of left foot with fat layer exposed 11/24/2022 No Yes I10 Essential (primary) hypertension 11/24/2022 No Yes M21.371 Foot drop, right foot 11/24/2022 No Yes Z86.718 Personal history of other venous thrombosis and embolism 11/24/2022 No Yes Z79.01 Long term (current) use of anticoagulants 11/24/2022 No Yes Inactive Problems Resolved Problems Electronic Signature(s) Signed: 12/08/2022 1:37:31 PM By: Allen Derry PA-C Entered By: Allen Derry on 12/08/2022 13:37:31 Kizzie Furnish ([id number]) [id number] Page 5 of  8 -------------------------------------------------------------------------------- Progress Note Details Patient Name: Date of Service: Allen Gregory [place] RD 12/08/2022 1:30 PM Medical Record Number: [id number] Patient Account Number: [id number] Date of Birth/Sex: Treating RN: 1960-08-18 (61 y.o. M) Primary Care Provider: [name], NO Other Clinician: Referring Provider: Treating Provider/Extender: Diona Browner in Treatment: 2 Subjective Chief Complaint Information obtained from Patient Left foot ulcer History of Present Illness (HPI) ADMISSION 07/09/2019 This is a pleasant 62 year old man who referred himself here for second opinion sign a new area on the right plantar first toe and the dorsal part of his right fifth toe. He says he had these in August when he had removed callus from the first toe and then played pool volleyball for about 4 hours. He thinks the bottom of the pool simply caused an abrasion of the toe. The story sounds accurate. He has been going to podiatry for the last several months. Me a picture on his phone from August and the wound is gotten considerably smaller. He states that it started doing well recently when they started collagen. He has been using a surgical shoe to offload. 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 He had a brace for a long time but now he simply lifts his foot higher to clear but states that most people would not even notice that he had any disability. He is a type 2 diabetes Badik but he has diet-controlled he has lost weight by watching calories. 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 07/16/2019; right fifth toe is closed.  Right first toe is smaller. He is using a surgical shoe but he tells me is fairly religious about using his scooter at home. Hopefully this will give him enough offloading to close this. 2/1; right fifth toe dorsally remains closed. Right first toe still not a lot of improvement. Although superficially it looks smaller there is undermining laterally from 6-6 of at least 3 to 4 mm. Also worrisome is that the granulation does not look that healthy. We have been using silver collagen He had his foot x-rayed by his podiatrist but I do not have access to this. I am going to x-ray the right great toe again. Otherwise I am thinking he is going to require a total contact cast probably next week and I discussed this with him today. 2/9; his x-ray of the right foot did not show plain x-ray evidence of osteomyelitis. We have been using silver alginate the wound is  not improved looks static. He is going to get a total contact cast today He comes in today telling me that before he left podiatry before he came here he had noninvasive arterial studies that were done by a mobile service and the podiatrist office. They are now trying to arrange I think an angiogram however this would be in [facility]. He asked my opinion on this and I told him that angiograms are done by several different doctor groups locally and if he is from [place] I cannot see a reason to go out of [facility] for any procedure he might need. We will see if we can get the actual angiogram report and I will refer him as needed to either vein and vascular or interventional cardiology 2/12; back for his first obligatory total contact cast change. I did get his arterial studies from in [facility] office. Biphasic waveforms ABI in the right of 1.16 on the left at 1.14 I am not really sure what was so concerning about this [place] I will simply repeat these studies along with TBI's  locally. There should not be any need for him to go out of town to have this evaluation. I am not sure that there is a clinical need 2/16; once again the total contact cast was too loose he has multiple skin excoriations where things were rubbing. He claims to not be on this all that much and is working from home but between the size of his foot the wasting in the distal part of his lower leg and large calfs we just cannot mold the cast in to fit his leg properly.-Changed him to a forefoot off loader but with his foot drop that may not be possible. 2/23; we put him out in a surgical shoe last week as we did not have a forefoot off loader. We have this today. We are using silver alginate. He did not tolerate a total contact cast 3/1; he is in a forefoot offloading boot. Using silver collagen as of last week. No major change 3/8; he is using a forefoot offloading boot. Silver collagen over the last 2 weeks. Perhaps some reduction in depth. Moreover surface looks healthy 3/15; he is a forefoot offloading boot. Silver collagen over the last 3 weeks. There has been reduction in depth. Much less circumferential callus and thick subcutaneous tissue. 3/22; his original wound on the plantar right great toe is somewhat improved. However he arrives in clinic today with a completely denuded ulcer on the tip of the toe distal to the original wound. More concerning is there is loss of surface epithelium over a wide area around this even dorsally on the toe. He states that he last wrapped the area on Saturday. He was up at [facility] walking this weekend otherwise he had not done anything different. Noticeable for the fact that his forefoot offloading shoe was not in good condition fact that split open this could have had something to do with this but I have no doubt that there is also some degree of infection/cellulitis 3/29; patient came to the clinic last week with a new wound on the tip of the right great toe.  This looked infected there was epithelial loss extending to the base of the first toe dorsally on the right. I gave him empiric doxycycline. Culture I did showed methicillin sensitive staph aureus Proteus and group B strep. I gave him doxycycline which  is not a reliable cover of strep or Proteus. I am going to give him a course of Augmentin today. He has not been systemically unwell. 4/5; original wound on the plantar right great toe. 2 weeks ago had a new wound on the tip of the right great toe with extensive cellulitis. Culture I did at that time ultimately showed methicillin sensitive staph aureus Proteus and group B strep. He received some doxycycline I changed him to Augmentin last week he has 3 more days. He is complaining of some pruritus but no rash. X-ray; showed no bony abnormalities in the right great toe. 4/12; no evidence of infection. Right great toe now working on the original wound. The new wound at the tip of the toe is callused but no open area. 4/19; wound measures about the same. Roughly 3 mm of depth. He has a lot of callus on the tip of the toe where the subsequent wound was however there is no open wound here. Some callus around the circumference of the wound we have been working on 4/26; the wound that he originally had on the plantar aspect about the same. Medially he had a new wound which he says started as an "blood blister". Obviously this has unroofed if they were true. We have been using silver alginate 5/3; the wound on the plantar toe measures smaller although he still has thick callus and skin. It is difficult to tell whether this is going to close or not. The blister that we found medially last week is also close over but not necessarily with a vibrant lady healthy looking surface TAHIR, BLANK ([id number]) [id number] Page 6 of 8 5/10; small wound on the right plantar toe still callus debris over the surface requiring debridement. We have  been using silver collagen. Noteworthy that the front part of his forefoot offloading shoe is totally worn out suggesting he is really not offloading this toe adequately even using what should be a forefoot offloading shoe. I have asked him to keep his weight back on his heel 11/12/19-Patient returns at 2 weeks, right plantar toe wound with some callus debris that also required debridement, patient has a new offloading shoe that is functioning better for him. Wound appears to be smaller by using silver alginate Readmission: 11-24-2022 upon evaluation today patient presents for reevaluation in the clinic although it has been since May 2021 since he was last seen. With that being said he has a wound on the plantar aspect of his foot which is of concern here. His most recent hemoglobin A1c was 8.5 that was March 2024. With that being said I do believe that he is working on this he tells me he is trying to get that down he needs to have ankle fusion surgery. 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 6/7; here for replacement of a total contact cast. 12-01-2022 upon evaluation today patient appears to be doing well currently in regard to his wound which is actually measuring significantly smaller compared to previous. Fortunately there does not appear to be any signs of infection locally nor systemically which is great news and in general I do believe that removing in the appropriate direction here. 12-08-2022 upon evaluation today patient appears to be doing well currently in regard to the wound on his foot. The cast seems to be doing an excellent job and in general I think that we are actually  very close to complete resolution. I am extremely pleased in that regard. Objective Constitutional Well-nourished and well-hydrated in no acute distress. Vitals Time Taken: 1:33 PM, Height: 78 in, Weight: 310 lbs,  BMI: 35.8, Temperature: 98.3 F, Pulse: 91 bpm, Respiratory Rate: 18 breaths/min, Blood Pressure: 125/78 mmHg. Respiratory normal breathing without difficulty. Psychiatric this patient is able to make decisions and demonstrates good insight into disease process. Alert and Oriented x 3. pleasant and cooperative. General Notes: Patient's wound bed actually showed signs of good granulation epithelization at this point. Fortunately I do not see any evidence of active infection locally or systemically which is great news and I think the cast is doing awesome job. Integumentary (Hair, Skin) Wound #5 status is Open. Original cause of wound was Blister. The date acquired was: 10/21/2022. The wound has been in treatment 2 weeks. The wound is located on the Left,Plantar Foot. The wound measures 0.1cm length x 0.1cm width x 0.1cm depth; 0.008cm^2 area and 0.001cm^3 volume. There is Fat Layer (Subcutaneous Tissue) exposed. There is no tunneling or undermining noted. There is a none present amount of drainage noted. The wound margin is distinct with the outline attached to the wound base. There is no granulation within the wound bed. There is no necrotic tissue within the wound bed. The periwound skin appearance exhibited: Callus, Dry/Scaly. The periwound skin appearance did not exhibit: Crepitus, Excoriation, Induration, Rash, Scarring, Maceration, Atrophie Blanche, Cyanosis, Ecchymosis, Hemosiderin Staining, Mottled, Pallor, Rubor, Erythema. Assessment Active Problems ICD-10 Type 2 diabetes mellitus with foot ulcer Non-pressure chronic ulcer of other part of left foot with fat layer exposed Essential (primary) hypertension Foot drop, right foot Personal history of other venous thrombosis and embolism Long term (current) use of anticoagulants Procedures Wound #5 Pre-procedure diagnosis of Wound #5 is a Diabetic Wound/Ulcer of the Lower Extremity located on the Left,Plantar Foot . There was a T Contact  Cast otal Procedure by Lenda Kelp, PA. Post procedure Diagnosis Wound #5: Same as Pre-Procedure Notes: size 4. GALVIN, AVERSA ([id number]) [id number] Page 7 of 8 Plan Follow-up Appointments: Return Appointment in 1 week. - 12/15/2022 215pm [facility] Wednesday room 8 Castw Anesthetic: (In clinic) Topical Lidocaine 4% applied to wound bed Bathing/ Shower/ Hygiene: May shower with protection but do not get wound dressing(s) wet. Protect dressing(s) with water repellant cover (for example, large plastic bag) or a cast cover and may then take shower. Off-Loading: T Contact Cast to Left Lower Extremity - size 4 TCC pad the calf with foam for protection. otal WOUND #5: - Foot Wound Laterality: Plantar, Left Cleanser: Soap and Water 1 x Per Week/30 Days Discharge Instructions: May shower and wash wound with dial antibacterial soap and water prior to dressing change. Cleanser: Wound Cleanser 1 x Per Week/30 Days Discharge Instructions: Cleanse the wound with wound cleanser prior to applying a clean dressing using gauze sponges, not tissue or cotton balls. Peri-Wound Care: Skin Prep 1 x Per Week/30 Days Discharge Instructions: Use skin prep as directed Prim Dressing: Maxorb Extra Calcium Alginate, 2x2 (in/in) 1 x Per Week/30 Days ary Discharge Instructions: Apply to wound bed as instructed Secondary Dressing: ABD Pad, 8x10 1 x Per Week/30 Days Discharge Instructions: Apply over primary dressing as directed. Secured With: [employer], 4.5x3.1 (in/yd) 1 x Per Week/30 Days Discharge Instructions: Secure with Kerlix as directed. Secured With: 58M Medipore H Soft Cloth Surgical T ape, 4 x 10 (in/yd) 1 x Per Week/30 Days Discharge Instructions: Secure with tape as directed. Add-Ons: TCC-EZ Cast  Boot, Charcot, Xlarge 1 x Per Week/30 Days Discharge Instructions: Size 4 1. I would recommend that we have the patient continue with total contact casting. Based on what I  am seeing I think that he is very close to complete resolution and in fact I think once this toughen he will be in good shape to proceed with normal activity. He is very pleased to hear this. With that being said we will see how things look next week. 2. I am good recommend as well patient should along with a total contact casting still take it easy he is in agreement with that plan. We will see patient back for reevaluation in 1 week here in the clinic. If anything worsens or changes patient will contact our office for additional recommendations. Electronic Signature(s) Signed: 12/14/2022 8:42:57 AM By: Allen Derry PA-C Entered By: Allen Derry on 12/14/2022 08:42:57 -------------------------------------------------------------------------------- Total Contact Cast Details Patient Name: Date of Service: Allen Gregory [place] RD 12/08/2022 1:30 PM Medical Record Number: [id number] Patient Account Number: [id number] Date of Birth/Sex: Treating RN: [birth year] (61 y.o. [name] Primary Care Provider: [name], NO Other Clinician: Referring Provider: Treating Provider/Extender: Diona Browner in Treatment: 2 T Contact Cast Applied for Wound Assessment: otal Wound #5 Left,Plantar Foot Performed By: Physician Lenda Kelp, PA Post Procedure Diagnosis Same as Pre-procedure Notes size 4 Electronic Signature(s) Signed: 12/08/2022 3:27:24 PM By: Shawn Stall RN, BSN Signed: 12/09/2022 5:52:49 PM By: Allen Derry PA-C Entered By: Shawn Stall on 12/08/2022 14:04:27 Kizzie Furnish ([id number]) [id number] Page 8 of 8 -------------------------------------------------------------------------------- SuperBill Details Patient Name: Date of Service: Allen Gregory NA RD 12/08/2022 Medical Record Number: [id number] Patient Account Number: [id number] Date of Birth/Sex: Treating RN: 1960/12/27 (62 y.o. [name] Primary Care Provider: [name], NO  Other Clinician: Referring Provider: Treating Provider/Extender: Diona Browner in Treatment: 2 Diagnosis Coding ICD-10 Codes Code Description [phone] Type 2 diabetes mellitus with foot ulcer L97.522 Non-pressure chronic ulcer of other part of left foot with fat layer exposed I10 Essential (primary) hypertension M21.371 Foot drop, right foot Z86.718 Personal history of other venous thrombosis and embolism Z79.01 Long term (current) use of anticoagulants Facility Procedures : CPT4 Code: [id number] Description: 29445 - APPLY TOTAL CONTACT LEG CAST ICD-10 Diagnosis Description L97.522 Non-pressure chronic ulcer of other part of left foot with fat layer exposed E11.621 Type 2 diabetes mellitus with foot ulcer Modifier: Quantity: 1 Physician Procedures : CPT4 Code Description Modifier [id number] 29445 - WC PHYS APPLY TOTAL CONTACT CAST ICD-10 Diagnosis Description L97.522 Non-pressure chronic ulcer of other part of left foot with fat layer exposed E11.621 Type 2 diabetes mellitus with foot ulcer Quantity: 1 Electronic Signature(s) Signed: 12/14/2022 8:43:14 AM By: Allen Derry PA-C Previous Signature: 12/08/2022 3:27:24 PM Version By: Shawn Stall RN, BSN Previous Signature: 12/09/2022 5:52:49 PM Version By: Allen Derry PA-C Entered By: Allen Derry on 12/14/2022 08:43:13

## 2022-12-15 ENCOUNTER — Encounter (HOSPITAL_BASED_OUTPATIENT_CLINIC_OR_DEPARTMENT_OTHER): Payer: 59 | Admitting: Physician Assistant

## 2022-12-15 DIAGNOSIS — E11621 Type 2 diabetes mellitus with foot ulcer: Secondary | ICD-10-CM | POA: Diagnosis not present

## 2022-12-15 NOTE — Progress Notes (Addendum)
VICTORIA, FIORITO ([id number]) [id number] Page 1 of 7 Visit Report for 12/15/2022 Chief Complaint Document Details Patient Name: Date of Service: Allen Gregory 12/15/2022 2:15 PM Medical Record Number: [id number] Patient Account Number: [id number] Date of Birth/Sex: Treating RN: 08/07/1960 (62 y.o. M) Primary Care Provider: [name], NO Other Clinician: Referring Provider: Treating Provider/Extender: Diona Browner in Treatment: 3 Information Obtained from: Patient Chief Complaint Left foot ulcer Electronic Signature(s) Signed: 12/15/2022 2:05:53 PM By: Allen Derry PA-C Entered By: Allen Derry on 12/15/2022 14:05:53 -------------------------------------------------------------------------------- HPI Details Patient Name: Date of Service: Allen Gregory 12/15/2022 2:15 PM Medical Record Number: [id number] Patient Account Number: [id number] Date of Birth/Sex: Treating RN: [birth year] (62 y.o. M) Primary Care Provider: [name], NO Other Clinician: Referring Provider: Treating Provider/Extender: Diona Browner in Treatment: 3 History of Present Illness HPI Description: ADMISSION 07/09/2019 This is a pleasant 62 year old man who referred himself here for second opinion sign a new area on the right plantar first toe and the dorsal part of his right fifth toe. He says he had these in August when he had removed callus from the first toe and then played pool volleyball for about 4 hours. He thinks the bottom of the pool simply caused an abrasion of the toe. The story sounds accurate. He has been going to podiatry for the last several months. Me a picture on his phone from August and the wound is gotten considerably smaller. He states that it started doing well recently when they started collagen. He has been using a surgical shoe to offload. 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 He had a brace for a long time but now he simply lifts his foot higher to clear but states that most people would not even notice that he had any disability. He is a type 2 diabetes Badik but he has diet-controlled he has lost weight by watching calories. 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 07/16/2019; right fifth toe is closed. Right first toe is smaller. He is using a surgical shoe but he tells me is fairly religious about using his scooter at home. Hopefully this will give him enough offloading to close this. 2/1; right fifth toe dorsally remains closed. Right first toe still not a lot of improvement. Although superficially it looks smaller there is undermining laterally from 6-6 of at least 3 to 4 mm. Also worrisome is that the granulation does not look that healthy. We have been using silver collagen He had his foot x-rayed by his podiatrist but I do not have access to this. I am going to x-ray the right great toe again. Otherwise I am thinking he is going to require a total contact cast probably next week and I discussed this with him today. 2/9; his x-ray of the right foot did not show plain x-ray evidence of osteomyelitis. We have been using silver alginate the wound is not improved looks static. He is going to get a total contact cast today He comes in today telling me that before he left podiatry before he came here he had noninvasive arterial studies that were done by a  mobile service and the podiatrist office. They are now trying to arrange I think an angiogram however this would be in [facility]. He asked my opinion on this and I told him that angiograms are done by several different doctor groups locally  and if he is from [place] I cannot see a reason to go out of [facility] for any procedure he might need. We will see if we can get the actual angiogram report and I will refer him as needed to either vein and vascular or interventional cardiology Allen Gregory, Allen Gregory ([id number]) [id number] Page 2 of 7 2/12; back for his first obligatory total contact cast change. I did get his arterial studies from in [facility]. Biphasic waveforms ABI in the right of 1.16 on the left at 1.14 I am not really sure what was so concerning about this [place] I will simply repeat these studies along with TBI's locally. There should not be any need for him to go out of town to have this evaluation. I am not sure that there is a clinical need 2/16; once again the total contact cast was too loose he has multiple skin excoriations where things were rubbing. He claims to not be on this all that much and is working from home but between the size of his foot the wasting in the distal part of his lower leg and large calfs we just cannot mold the cast in to fit his leg properly.-Changed him to a forefoot off loader but with his foot drop that may not be possible. 2/23; we put him out in a surgical shoe last week as we did not have a forefoot off loader. We have this today. We are using silver alginate. He did not tolerate a total contact cast 3/1; he is in a forefoot offloading boot. Using silver collagen as of last week. No major change 3/8; he is using a forefoot offloading boot. Silver collagen over the last 2 weeks. Perhaps some reduction in depth. Moreover surface looks healthy 3/15; he is a forefoot offloading boot. Silver collagen over the last 3 weeks. There has been reduction in depth. Much less circumferential callus and thick subcutaneous tissue. 3/22; his original wound on the plantar right great toe is somewhat improved. However  he arrives in clinic today with a completely denuded ulcer on the tip of the toe distal to the original wound. More concerning is there is loss of surface epithelium over a wide area around this even dorsally on the toe. He states that he last wrapped the area on Saturday. He was up at [facility] walking this weekend otherwise he had not done anything different. Noticeable for the fact that his forefoot offloading shoe was not in good condition fact that split open this could have had something to do with this but I have no doubt that there is also some degree of infection/cellulitis 3/29; patient came to the clinic last week with a new wound on the tip of the right great toe. This looked infected there was epithelial loss extending to the base of the first toe dorsally on the right. I gave him empiric doxycycline. Culture I did showed methicillin sensitive staph aureus Proteus and group B strep. I gave him doxycycline which is not a reliable cover of strep or Proteus. I am going to give him a course of Augmentin today. He has not been systemically unwell. 4/5; original wound on the  plantar right great toe. 2 weeks ago had a new wound on the tip of the right great toe with extensive cellulitis. Culture I did at that time ultimately showed methicillin sensitive staph aureus Proteus and group B strep. He received some doxycycline I changed him to Augmentin last week he has 3 more days. He is complaining of some pruritus but no rash. X-ray; showed no bony abnormalities in the right great toe. 4/12; no evidence of infection. Right great toe now working on the original wound. The new wound at the tip of the toe is callused but no open area. 4/19; wound measures about the same. Roughly 3 mm of depth. He has a lot of callus on the tip of the toe where the subsequent wound was however there is no open wound here. Some callus around the circumference of the wound we have been working on 4/26; the wound that he  originally had on the plantar aspect about the same. Medially he had a new wound which he says started as an "blood blister". Obviously this has unroofed if they were true. We have been using silver alginate 5/3; the wound on the plantar toe measures smaller although he still has thick callus and skin. It is difficult to tell whether this is going to close or not. The blister that we found medially last week is also close over but not necessarily with a vibrant lady healthy looking surface 5/10; small wound on the right plantar toe still callus debris over the surface requiring debridement. We have been using silver collagen. Noteworthy that the front part of his forefoot offloading shoe is totally worn out suggesting he is really not offloading this toe adequately even using what should be a forefoot offloading shoe. I have asked him to keep his weight back on his heel 11/12/19-Patient returns at 2 weeks, right plantar toe wound with some callus debris that also required debridement, patient has a new offloading shoe that is functioning better for him. Wound appears to be smaller by using silver alginate Readmission: 11-24-2022 upon evaluation today patient presents for reevaluation in the clinic although it has been since May 2021 since he was last seen. With that being said he has a wound on the plantar aspect of his foot which is of concern here. His most recent hemoglobin A1c was 8.5 that was March 2024. With that being said I do believe that he is working on this he tells me he is trying to get that down he needs to have ankle fusion surgery. 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 6/7; here for replacement of a total contact cast. 12-01-2022 upon evaluation today patient appears to be doing well currently in regard to his wound which is actually measuring significantly smaller compared  to previous. Fortunately there does not appear to be any signs of infection locally nor systemically which is great news and in general I do believe that removing in the appropriate direction here. 12-08-2022 upon evaluation today patient appears to be doing well currently in regard to the wound on his foot. The cast seems to be doing an excellent job and in general I think that we are actually very close to complete resolution. I am extremely pleased in that regard. 12-15-2022 upon evaluation today patient appears to be doing excellent in regard to his foot ulcer. In fact this appears to be completely healed based on  what I am seeing. I am extremely pleased with where we stand currently. Electronic Signature(s) Signed: 12/15/2022 3:08:31 PM By: Allen Derry PA-C Entered By: Allen Derry on 12/15/2022 15:08:31 -------------------------------------------------------------------------------- Physical Exam Details Patient Name: Date of Service: Allen Gregory 12/15/2022 2:15 PM Medical Record Number: [id number] Patient Account Number: [id number] Date of Birth/Sex: Treating RN: 09-16-1960 (61 y.o. M) Primary Care Provider: [name], NO Other Clinician: Referring Provider: Treating Provider/Extender: Diona Browner in Treatment: 3 Constitutional Well-nourished and well-hydrated in no acute distress. Respiratory Allen Gregory, Allen Gregory ([id number]) [id number] Page 3 of 7 normal breathing without difficulty. Psychiatric this patient is able to make decisions and demonstrates good insight into disease process. Alert and Oriented x 3. pleasant and cooperative. Notes Upon inspection patient's wound bed I did test to make sure there was nothing open but again it seems to be very good he seems to be making good progress towards remodeling the skin and to be honest I think the extra week in the cast did excellent for him. I think he is ready for discharge  today. Electronic Signature(s) Signed: 12/15/2022 3:08:50 PM By: Allen Derry PA-C Entered By: Allen Derry on 12/15/2022 15:08:50 -------------------------------------------------------------------------------- Physician Orders Details Patient Name: Date of Service: Allen Gregory 12/15/2022 2:15 PM Medical Record Number: [id number] Patient Account Number: [id number] Date of Birth/Sex: Treating RN: 1961-01-22 (61 y.o. Allen Gregory Primary Care Provider: PA Allen Gregory, NO Other Clinician: Referring Provider: Treating Provider/Extender: Diona Browner in Treatment: 3 Verbal / Phone Orders: No Diagnosis Coding ICD-10 Coding Code Description E11.621 Type 2 diabetes mellitus with foot ulcer L97.522 Non-pressure chronic ulcer of other part of left foot with fat layer exposed I10 Essential (primary) hypertension M21.371 Foot drop, right foot Z86.718 Personal history of other venous thrombosis and embolism Z79.01 Long term (current) use of anticoagulants Discharge From [facility] Discharge from [facility] - Congratulations you are healed! Wound Treatment Electronic Signature(s) Signed: 12/15/2022 4:15:09 PM By: Allen Derry PA-C Signed: 12/15/2022 4:22:46 PM By: Karie Schwalbe RN Entered By: Karie Schwalbe on 12/15/2022 14:51:32 -------------------------------------------------------------------------------- Problem List Details Patient Name: Date of Service: Allen Gregory 12/15/2022 2:15 PM Medical Record Number: [id number] Patient Account Number: [id number] Date of Birth/Sex: Treating RN: 1961/04/03 (61 y.o. M) Primary Care Provider: [name], NO Other Clinician: Referring Provider: Treating Provider/Extender: Diona Browner in Treatment: 3 Active Problems ICD-10 KINDLE, CARVER ([id number]) [id number] Page 4 of 7 Encounter Code Description Active Date MDM Diagnosis E11.621 Type 2 diabetes  mellitus with foot ulcer 11/24/2022 No Yes L97.522 Non-pressure chronic ulcer of other part of left foot with fat layer exposed 11/24/2022 No Yes I10 Essential (primary) hypertension 11/24/2022 No Yes M21.371 Foot drop, right foot 11/24/2022 No Yes Z86.718 Personal history of other venous thrombosis and embolism 11/24/2022 No Yes Z79.01 Long term (current) use of anticoagulants 11/24/2022 No Yes Inactive Problems Resolved Problems Electronic Signature(s) Signed: 12/15/2022 2:05:47 PM By: Allen Derry PA-C Entered By: Allen Derry on 12/15/2022 14:05:47 -------------------------------------------------------------------------------- Progress Note Details Patient Name: Date of Service: Allen Gregory 12/15/2022 2:15 PM Medical Record Number: [id number] Patient Account Number: [id number] Date of Birth/Sex: Treating RN: [birth year] (61 y.o. M) Primary Care Provider: [name], NO Other Clinician: Referring Provider: Treating Provider/Extender: Diona Browner in Treatment: 3 Subjective Chief Complaint Information obtained from Patient Left foot ulcer History of Present Illness (HPI) ADMISSION 07/09/2019 This is  a pleasant 62 year old man who referred himself here for second opinion sign a new area on the right plantar first toe and the dorsal part of his right fifth toe. He says he had these in August when he had removed callus from the first toe and then played pool volleyball for about 4 hours. He thinks the bottom of the pool simply caused an abrasion of the toe. The story sounds accurate. He has been going to podiatry for the last several months. Me a picture on his phone from August and the wound is gotten considerably smaller. He states that it started doing well recently when they started collagen. He has been using a surgical shoe to offload. The history is complicated not just by diabetes but he has a long history of right foot drop apparently related to nerve damage to  the sciatic nerve sustained during hip surgery during the 1990s. He had a brace for a long time but now he simply lifts his foot higher to clear but states that most people would not even notice that he had any disability. He is a type 2 diabetes Badik but he has diet-controlled he has lost weight by watching calories. 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 07/16/2019; right fifth toe is closed. Right first toe is smaller. He is using a surgical shoe but he tells me is fairly religious about using his scooter at home. Hopefully this will give him enough offloading to close this. 2/1; right fifth toe dorsally remains closed. Right first toe still not a lot of improvement. Although superficially it looks smaller there is undermining laterally Allen Gregory, Allen Gregory ([id number]) [id number] Page 5 of 7 from 6-6 of at least 3 to 4 mm. Also worrisome is that the granulation does not look that healthy. We have been using silver collagen He had his foot x-rayed by his podiatrist but I do not have access to this. I am going to x-ray the right great toe again. Otherwise I am thinking he is going to require a total contact cast probably next week and I discussed this with him today. 2/9; his x-ray of the right foot did not show plain x-ray evidence of osteomyelitis. We have been using silver alginate the wound is not improved looks static. He is going to get a total contact cast today He comes in today telling me that before he left podiatry before he came here he had noninvasive arterial studies that were done by a mobile service and the podiatrist office. They are now trying to arrange I think an angiogram however this would be in [facility]. He asked my opinion on this and I told him that angiograms are done by several  different doctor groups locally and if he is from [place] I cannot see a reason to go out of [facility] for any procedure he might need. We will see if we can get the actual angiogram report and I will refer him as needed to either vein and vascular or interventional cardiology 2/12; back for his first obligatory total contact cast change. I did get his arterial studies from in [facility]. Biphasic waveforms ABI in the right of 1.16 on the left at 1.14 I am not really sure what was so concerning about this [place]  surgeon I will simply repeat these studies along with TBI's locally. There should not be any need for him to go out of town to have this evaluation. I am not sure that there is a clinical need 2/16; once again the total contact cast was too loose he has multiple skin excoriations where things were rubbing. He claims to not be on this all that much and is working from home but between the size of his foot the wasting in the distal part of his lower leg and large calfs we just cannot mold the cast in to fit his leg properly.-Changed him to a forefoot off loader but with his foot drop that may not be possible. 2/23; we put him out in a surgical shoe last week as we did not have a forefoot off loader. We have this today. We are using silver alginate. He did not tolerate a total contact cast 3/1; he is in a forefoot offloading boot. Using silver collagen as of last week. No major change 3/8; he is using a forefoot offloading boot. Silver collagen over the last 2 weeks. Perhaps some reduction in depth. Moreover surface looks healthy 3/15; he is a forefoot offloading boot. Silver collagen over the last 3 weeks. There has been reduction in depth. Much less circumferential callus and thick subcutaneous tissue. 3/22; his original wound on the plantar right great toe is somewhat improved. However he arrives in clinic today with a completely  denuded ulcer on the tip of the toe distal to the original wound. More concerning is there is loss of surface epithelium over a wide area around this even dorsally on the toe. He states that he last wrapped the area on Saturday. He was up at [facility] walking this weekend otherwise he had not done anything different. Noticeable for the fact that his forefoot offloading shoe was not in good condition fact that split open this could have had something to do with this but I have no doubt that there is also some degree of infection/cellulitis 3/29; patient came to the clinic last week with a new wound on the tip of the right great toe. This looked infected there was epithelial loss extending to the base of the first toe dorsally on the right. I gave him empiric doxycycline. Culture I did showed methicillin sensitive staph aureus Proteus and group B strep. I gave him doxycycline which is not a reliable cover of strep or Proteus. I am going to give him a course of Augmentin today. He has not been systemically unwell. 4/5; original wound on the plantar right great toe. 2 weeks ago had a new wound on the tip of the right great toe with extensive cellulitis. Culture I did at that time ultimately showed methicillin sensitive staph aureus Proteus and group B strep. He received some doxycycline I changed him to Augmentin last week he has 3 more days. He is complaining of some pruritus but no rash. X-ray; showed no bony abnormalities in the right great toe. 4/12; no evidence of infection. Right great toe now working on the original wound. The new wound at the tip of the toe is callused but no open area. 4/19; wound measures about the same. Roughly 3 mm of depth. He has a lot of callus on the tip of the toe where the subsequent wound was however there is no open wound here. Some callus around the circumference of the wound we have been working on 4/26; the wound that he originally had on  the plantar aspect about the  same. Medially he had a new wound which he says started as an "blood blister". Obviously this has unroofed if they were true. We have been using silver alginate 5/3; the wound on the plantar toe measures smaller although he still has thick callus and skin. It is difficult to tell whether this is going to close or not. The blister that we found medially last week is also close over but not necessarily with a vibrant lady healthy looking surface 5/10; small wound on the right plantar toe still callus debris over the surface requiring debridement. We have been using silver collagen. Noteworthy that the front part of his forefoot offloading shoe is totally worn out suggesting he is really not offloading this toe adequately even using what should be a forefoot offloading shoe. I have asked him to keep his weight back on his heel 11/12/19-Patient returns at 2 weeks, right plantar toe wound with some callus debris that also required debridement, patient has a new offloading shoe that is functioning better for him. Wound appears to be smaller by using silver alginate Readmission: 11-24-2022 upon evaluation today patient presents for reevaluation in the clinic although it has been since May 2021 since he was last seen. With that being said he has a wound on the plantar aspect of his foot which is of concern here. His most recent hemoglobin A1c was 8.5 that was March 2024. With that being said I do believe that he is working on this he tells me he is trying to get that down he needs to have ankle fusion surgery. 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 6/7; here for replacement of a total contact cast. 12-01-2022 upon evaluation today patient appears to be doing well currently in regard to his wound which is actually measuring significantly smaller compared to previous. Fortunately there does not appear to be  any signs of infection locally nor systemically which is great news and in general I do believe that removing in the appropriate direction here. 12-08-2022 upon evaluation today patient appears to be doing well currently in regard to the wound on his foot. The cast seems to be doing an excellent job and in general I think that we are actually very close to complete resolution. I am extremely pleased in that regard. 12-15-2022 upon evaluation today patient appears to be doing excellent in regard to his foot ulcer. In fact this appears to be completely healed based on what I am seeing. I am extremely pleased with where we stand currently. Objective Constitutional Well-nourished and well-hydrated in no acute distress. Vitals Time Taken: 2:06 PM, Height: 78 in, Weight: 310 lbs, BMI: 35.8, Temperature: 98.7 F, Pulse: 103 bpm, Respiratory Rate: 18 breaths/min, Blood Pressure: 133/78 mmHg. Respiratory normal breathing without difficulty. Allen Gregory, Allen Gregory ([id number]) [id number] Page 6 of 7 Psychiatric this patient is able to make decisions and demonstrates good insight into disease process. Alert and Oriented x 3. pleasant and cooperative. General Notes: Upon inspection patient's wound bed I did test to make sure there was nothing open but again it seems to be very good he seems to be making good progress towards remodeling the skin and to be honest I think the extra week in the cast did excellent for him. I think he is ready for discharge today. Integumentary (Hair, Skin) Wound #5 status is Open. Original cause of wound was Blister.  The date acquired was: 10/21/2022. The wound has been in treatment 3 weeks. The wound is located on the Left,Plantar Foot. The wound measures 0cm length x 0cm width x 0cm depth; 0cm^2 area and 0cm^3 volume. There is Fat Layer (Subcutaneous Tissue) exposed. There is a none present amount of drainage noted. The wound margin is distinct with the outline  attached to the wound base. There is no granulation within the wound bed. There is no necrotic tissue within the wound bed. The periwound skin appearance exhibited: Callus, Dry/Scaly. The periwound skin appearance did not exhibit: Crepitus, Excoriation, Induration, Rash, Scarring, Maceration, Atrophie Blanche, Cyanosis, Ecchymosis, Hemosiderin Staining, Mottled, Pallor, Rubor, Erythema. Assessment Active Problems ICD-10 Type 2 diabetes mellitus with foot ulcer Non-pressure chronic ulcer of other part of left foot with fat layer exposed Essential (primary) hypertension Foot drop, right foot Personal history of other venous thrombosis and embolism Long term (current) use of anticoagulants Plan Discharge From [facility] Services: Discharge from Wound Care Center - Congratulations you are healed! 1. I did advise that the patient should continue to still limit his walking to the necessary things only. I do not think he needs to be up and about as much as possible obviously and in general once he gets his new shoes he should be able to be more active in that regard. 2. I am good recommend as well the patient should continue to monitor for any signs of infection or worsening. I did advise him that if he feels of any callus that he feels like it is too thick and starting to crack and he is to have that removed that I would be happy to see him and do that we just need him to contact and let me know if that occurs am hopeful however having issues and this will be necessary. We will see patient back for reevaluation in 1 week here in the clinic. If anything worsens or changes patient will contact our office for additional recommendations. Electronic Signature(s) Signed: 12/15/2022 3:09:46 PM By: Allen Derry PA-C Entered By: Allen Derry on 12/15/2022 15:09:46 -------------------------------------------------------------------------------- SuperBill Details Patient Name: Date of Service: Allen Gregory  12/15/2022 Medical Record Number: [id number] Patient Account Number: [id number] Date of Birth/Sex: Treating RN: 11-08-1960 (61 y.o. M) Primary Care Provider: [name], NO Other Clinician: Referring Provider: Treating Provider/Extender: Diona Browner in Treatment: 3 Diagnosis Coding ICD-10 Codes Code Description E11.621 Type 2 diabetes mellitus with foot ulcer L97.522 Non-pressure chronic ulcer of other part of left foot with fat layer exposed Kizzie Furnish ([id number]) [phone].pdf Page 7 of 7 I10 Essential (primary) hypertension M21.371 Foot drop, right foot Z86.718 Personal history of other venous thrombosis and embolism Z79.01 Long term (current) use of anticoagulants Facility Procedures : CPT4 Code: [id number] Description: [phone] - WOUND CARE VISIT-LEV 2 EST PT Modifier: Quantity: 1 Physician Procedures : CPT4 Code Description Modifier [id number] - WC PHYS LEVEL 3 - EST PT ICD-10 Diagnosis Description E11.621 Type 2 diabetes mellitus with foot ulcer L97.522 Non-pressure chronic ulcer of other part of left foot with fat layer exposed I10 Essential  (primary) hypertension M21.371 Foot drop, right foot Quantity: 1 Electronic Signature(s) Signed: 12/20/2022 1:00:45 PM By: Pearletha Alfred Signed: 12/20/2022 3:12:33 PM By: Allen Derry PA-C Previous Signature: 12/15/2022 3:10:08 PM Version By: Allen Derry PA-C Entered By: Pearletha Alfred on 12/20/2022 13:00:45

## 2022-12-20 NOTE — Progress Notes (Signed)
STATLER, BROAS ([id number]) [id number] Page 1 of 7 Visit Report for 12/15/2022 Arrival Information Details Patient Name: Date of Service: Allen Gregory [address] RD 12/15/2022 2:15 PM Medical Record Number: [id number] Patient Account Number: [id number] Date of Birth/Sex: Treating RN: [birth year] (62 y.o. M) Primary Care Quay Simkin: [name], NO Other Clinician: Referring Cozette Braggs: Treating Shereena Berquist/Extender: Diona Browner in Treatment: 3 Visit Information History Since Last Visit Added or deleted any medications: No Patient Arrived: Ambulatory Any new allergies or adverse reactions: No Arrival Time: 14:05 Had a fall or experienced change in No Accompanied By: girlfriend activities of daily living that may affect Transfer Assistance: None risk of falls: Patient Identification Verified: Yes Signs or symptoms of abuse/neglect since last visito No Secondary Verification Process Completed: Yes Hospitalized since last visit: No Patient Requires Transmission-Based Precautions: No Implantable device outside of the clinic excluding No Patient Has Alerts: Yes cellular tissue based products placed in the center Patient Alerts: Patient on Blood Thinner since last visit: Has Dressing in Place as Prescribed: Yes Pain Present Now: No Electronic Signature(s) Signed: 12/17/2022 9:36:46 AM By: Karl Ito Entered By: Karl Ito on 12/15/2022 14:05:58 -------------------------------------------------------------------------------- Clinic Level of Care Assessment Details Patient Name: Date of Service: Allen Gregory [place] RD 12/15/2022 2:15 PM Medical Record Number: [id number] Patient Account Number: [id number] Date of Birth/Sex: Treating RN: 27-May-1961 (62 y.o. M) Primary Care Dashaun Onstott: [name], NO Other Clinician: Referring Dru Laurel: Treating Mlissa Tamayo/Extender: Diona Browner in Treatment: 3 Clinic Level of Care Assessment  Items TOOL 4 Quantity Score []  - 0 Use when only an EandM is performed on FOLLOW-UP visit ASSESSMENTS - Nursing Assessment / Reassessment X- 1 10 Reassessment of Co-morbidities (includes updates in patient status) X- 1 5 Reassessment of Adherence to Treatment Plan ASSESSMENTS - Wound and Skin A ssessment / Reassessment X - Simple Wound Assessment / Reassessment - one wound 1 5 []  - 0 Complex Wound Assessment / Reassessment - multiple wounds []  - 0 Dermatologic / Skin Assessment (not related to wound area) ASSESSMENTS - Focused Assessment []  - 0 Circumferential Edema Measurements - multi extremities []  - 0 Nutritional Assessment / Counseling / Intervention BRAYDN, PICKLER ([id number]) [id number] Page 2 of 7 []  - 0 Lower Extremity Assessment (monofilament, tuning fork, pulses) []  - 0 Peripheral Arterial Disease Assessment (using hand held doppler) ASSESSMENTS - Ostomy and/or Continence Assessment and Care []  - 0 Incontinence Assessment and Management []  - 0 Ostomy Care Assessment and Management (repouching, etc.) PROCESS - Coordination of Care X - Simple Patient / Family Education for ongoing care 1 15 []  - 0 Complex (extensive) Patient / Family Education for ongoing care X- 1 10 Staff obtains Chiropractor, Records, T Results / Process Orders est []  - 0 Staff telephones HHA, Nursing Homes / Clarify orders / etc []  - 0 Routine Transfer to another Facility (non-emergent condition) []  - 0 Routine Hospital Admission (non-emergent condition) []  - 0 New Admissions / Manufacturing engineer / Ordering NPWT Apligraf, etc. , []  - 0 Emergency Hospital Admission (emergent condition) X- 1 10 Simple Discharge Coordination []  - 0 Complex (extensive) Discharge Coordination PROCESS - Special Needs []  - 0 Pediatric / Minor Patient Management []  - 0 Isolation Patient Management []  - 0 Hearing / Language / Visual special needs []  - 0 Assessment of  Community assistance (transportation, D/C planning, etc.) []  - 0 Additional assistance / Altered mentation []  - 0 Support Surface(s) Assessment (bed, cushion, seat, etc.) INTERVENTIONS - Wound Cleansing /  Measurement X - Simple Wound Cleansing - one wound 1 5 []  - 0 Complex Wound Cleansing - multiple wounds X- 1 5 Wound Imaging (photographs - any number of wounds) []  - 0 Wound Tracing (instead of photographs) []  - 0 Simple Wound Measurement - one wound []  - 0 Complex Wound Measurement - multiple wounds INTERVENTIONS - Wound Dressings []  - 0 Small Wound Dressing one or multiple wounds []  - 0 Medium Wound Dressing one or multiple wounds []  - 0 Large Wound Dressing one or multiple wounds []  - 0 Application of Medications - topical []  - 0 Application of Medications - injection INTERVENTIONS - Miscellaneous []  - 0 External ear exam []  - 0 Specimen Collection (cultures, biopsies, blood, body fluids, etc.) []  - 0 Specimen(s) / Culture(s) sent or taken to Lab for analysis []  - 0 Patient Transfer (multiple staff / Nurse, adult / Similar devices) []  - 0 Simple Staple / Suture removal (25 or less) []  - 0 Complex Staple / Suture removal (26 or more) []  - 0 Hypo / Hyperglycemic Management (close monitor of Blood Glucose) Kizzie Furnish ([id number]) [id number] Page 3 of 7 []  - 0 Ankle / Brachial Index (ABI) - do not check if billed separately X- 1 5 Vital Signs Has the patient been seen at the hospital within the last three years: Yes Total Score: 70 Level Of Care: New/Established - Level 2 Electronic Signature(s) Signed: 12/20/2022 2:58:52 PM By: Pearletha Alfred Entered By: Pearletha Alfred on 12/15/2022 15:38:11 -------------------------------------------------------------------------------- Encounter Discharge Information Details Patient Name: Date of Service: Allen Gregory, Allen Gregory RD 12/15/2022 2:15 PM Medical Record Number: [id number] Patient Account Number:  [id number] Date of Birth/Sex: Treating RN: 1960/10/18 (62 y.o. M) Primary Care Vadhir Mcnay: [name], NO Other Clinician: Referring Kripa Foskey: Treating Kenesha Moshier/Extender: Diona Browner in Treatment: 3 Encounter Discharge Information Items Discharge Condition: Stable Ambulatory Status: Ambulatory Discharge Destination: Home Transportation: Private Auto Schedule Follow-up Appointment: Yes Clinical Summary of Care: Electronic Signature(s) Signed: 12/15/2022 3:39:33 PM By: Pearletha Alfred Entered By: Pearletha Alfred on 12/15/2022 15:39:33 -------------------------------------------------------------------------------- Lower Extremity Assessment Details Patient Name: Date of Service: Allen Gregory Gregory RD 12/15/2022 2:15 PM Medical Record Number: [id number] Patient Account Number: [id number] Date of Birth/Sex: Treating RN: [birth year] (61 y.o. Dianna Limbo Primary Care Windsor Goeken: [name], NO Other Clinician: Referring Keldrick Pomplun: Treating Zarianna Dicarlo/Extender: Diona Browner in Treatment: 3 Edema Assessment Assessed: [Left: No] [Right: No] Edema: [Left: N] [Right: o] Calf Left: Right: Point of Measurement: 32 cm From Medial Instep 49 cm Ankle Left: Right: Point of Measurement: 12 cm From Medial Instep 26.5 cm Vascular Assessment PulsesBRECKEN, BARKUS ([id number]) [Right:[id number] Page 4 of 7] Dorsalis Pedis Palpable: [Left:Yes] Electronic Signature(s) Signed: 12/15/2022 4:22:46 PM By: Karie Schwalbe RN Entered By: Karie Schwalbe on 12/15/2022 14:51:03 -------------------------------------------------------------------------------- Multi-Disciplinary Care Plan Details Patient Name: Date of Service: Allen Gregory Gregory RD 12/15/2022 2:15 PM Medical Record Number: [id number] Patient Account Number: [id number] Date of Birth/Sex: Treating RN: [birth year] (62 y.o. M) Primary Care Virjean Boman: [name], NO Other  Clinician: Referring Albertia Carvin: Treating Torrey Horseman/Extender: Diona Browner in Treatment: 3 Multidisciplinary Care Plan reviewed with physician Active Inactive Electronic Signature(s) Signed: 12/15/2022 3:36:57 PM By: Pearletha Alfred Entered By: Pearletha Alfred on 12/15/2022 15:36:56 -------------------------------------------------------------------------------- Pain Assessment Details Patient Name: Date of Service: Allen Gregory Gregory RD 12/15/2022 2:15 PM Medical Record Number: [id number] Patient Account Number: [id number] Date of Birth/Sex: Treating RN: [birth year] (62 y.o. M) Primary  Care Truxton Stupka: [name], [place] Other Clinician: Referring Karlos Scadden: Treating Cordero Surette/Extender: Diona Browner in Treatment: 3 Active Problems Location of Pain Severity and Description of Pain Patient Has Paino No Site Locations Pain Management and Medication RIAAN, SKOKAN ([id number]) [id number] Page 5 of 7 Current Pain Management: Electronic Signature(s) Signed: 12/17/2022 9:36:46 AM By: Karl Ito Entered By: Karl Ito on 12/15/2022 14:07:40 -------------------------------------------------------------------------------- Patient/Caregiver Education Details Patient Name: Date of Service: Allen Gregory Gregory RD 6/26/2024andnbsp2:15 PM Medical Record Number: [id number] Patient Account Number: [id number] Date of Birth/Gender: Treating RN: 12/13/1960 (62 y.o. M) Primary Care Physician: [name], NO Other Clinician: Referring Physician: Treating Physician/Extender: Diona Browner in Treatment: 3 Education Assessment Education Provided To: Patient Education Topics Provided Offloading: Methods: Explain/Verbal Responses: State content correctly Electronic Signature(s) Signed: 12/20/2022 2:58:52 PM By: Pearletha Alfred Entered By: Pearletha Alfred on 12/15/2022  15:37:24 -------------------------------------------------------------------------------- Wound Assessment Details Patient Name: Date of Service: Allen Gregory Gregory RD 12/15/2022 2:15 PM Medical Record Number: [id number] Patient Account Number: [id number] Date of Birth/Sex: Treating RN: 12-03-1960 (62 y.o. M) Primary Care Kaidynce Pfister: [name], NO Other Clinician: Referring Abie Cheek: Treating Frances Ambrosino/Extender: Diona Browner in Treatment: 3 Wound Status Wound Number: 5 Primary Diabetic Wound/Ulcer of the Lower Extremity Etiology: Wound Location: Left, Plantar Foot Wound Status: Open Wounding Event: Blister Comorbid Pneumothorax, Deep Vein Thrombosis, Type II Diabetes, Date Acquired: 10/21/2022 History: Neuropathy Weeks Of Treatment: 3 Clustered Wound: No Photos Kizzie Furnish ([id number]) [id number] Page 6 of 7 Wound Measurements Length: (cm) Width: (cm) Depth: (cm) Area: (cm) Volume: (cm) 0 % Reduction in Area: 100% 0 % Reduction in Volume: 100% 0 Epithelialization: Large (67-100%) 0 0 Wound Description Classification: Unable to visualize wound bed Wound Margin: Distinct, outline attached Exudate Amount: None Present Foul Odor After Cleansing: No Slough/Fibrino No Wound Bed Granulation Amount: None Present (0%) Exposed Structure Necrotic Amount: None Present (0%) Fascia Exposed: No Fat Layer (Subcutaneous Tissue) Exposed: Yes Tendon Exposed: No Muscle Exposed: No Joint Exposed: No Bone Exposed: No Periwound Skin Texture Texture Color No Abnormalities Noted: No No Abnormalities Noted: No Callus: Yes Atrophie Blanche: No Crepitus: No Cyanosis: No Excoriation: No Ecchymosis: No Induration: No Erythema: No Rash: No Hemosiderin Staining: No Scarring: No Mottled: No Pallor: No Moisture Rubor: No No Abnormalities Noted: No Dry / Scaly: Yes Maceration: No Electronic Signature(s) Signed: 12/17/2022 9:36:46 AM By:  Karl Ito Entered By: Karl Ito on 12/15/2022 14:20:31 -------------------------------------------------------------------------------- Vitals Details Patient Name: Date of Service: Allen Gregory, Allen Gregory RD 12/15/2022 2:15 PM Medical Record Number: [id number] Patient Account Number: [id number] Date of Birth/Sex: Treating RN: Oct 27, 1960 (62 y.o. M) Primary Care Yida Hyams: [name], NO Other Clinician: Referring Magdaline Zollars: Treating Laken Lobato/Extender: Diona Browner in Treatment: 3 Vital Signs Time Taken: 14:06 Temperature (F): 98.7 Height (in): 78 Pulse (bpm): 103 Gancarz, Dino ([id number]) [phone].pdf Page 7 of 7 Weight (lbs): 310 Respiratory Rate (breaths/min): 18 Body Mass Index (BMI): 35.8 Blood Pressure (mmHg): 133/78 Reference Range: 80 - 120 mg / dl Electronic Signature(s) Signed: 12/17/2022 9:36:46 AM By: Karl Ito Entered By: Karl Ito on 12/15/2022 14:07:35

## 2023-01-12 NOTE — Progress Notes (Signed)
Gregory, Allen ([id number]) [id number] Page 1 of 6 Visit Report for 12/08/2022 Arrival Information Details Patient Name: Date of Service: Allen Gregory [place] RD 12/08/2022 1:30 PM Medical Record Number: [id number] Patient Account Number: [id number] Date of Birth/Sex: Treating RN: [birth year] (62 y.o. M) Primary Care Wynn Kernes: [name], NO Other Clinician: Referring Taven Strite: Treating Burk Hoctor/Extender: Diona Browner in Treatment: 2 Visit Information History Since Last Visit Added or deleted any medications: No Patient Arrived: Ambulatory Any new allergies or adverse reactions: No Arrival Time: 13:32 Had a fall or experienced change in No Accompanied By: girlfriend activities of daily living that may affect Transfer Assistance: None risk of falls: Patient Identification Verified: Yes Signs or symptoms of abuse/neglect since last visito No Secondary Verification Process Completed: Yes Hospitalized since last visit: No Patient Requires Transmission-Based Precautions: No Implantable device outside of the clinic excluding No Patient Has Alerts: Yes cellular tissue based products placed in the center Patient Alerts: Patient on Blood Thinner since last visit: Has Dressing in Place as Prescribed: Yes Pain Present Now: No Electronic Signature(s) Signed: 01/12/2023 2:27:14 PM By: Karl Ito Entered By: Karl Ito on 12/08/2022 13:33:05 -------------------------------------------------------------------------------- Encounter Discharge Information Details Patient Name: Date of Service: Allen Gregory, Allen Gregory NA RD 12/08/2022 1:30 PM Medical Record Number: [id number] Patient Account Number: [id number] Date of Birth/Sex: Treating RN: [birth year] (62 y.o. Allen Gregory Primary Care Evin Loiseau: [name], NO Other Clinician: Referring Leeona Mccardle: Treating Daekwon Beswick/Extender: Diona Browner in Treatment: 2 Encounter Discharge  Information Items Discharge Condition: Stable Ambulatory Status: Ambulatory Discharge Destination: Home Transportation: Private Auto Accompanied By: friend Schedule Follow-up Appointment: Yes Clinical Summary of Care: Electronic Signature(s) Signed: 12/08/2022 3:27:24 PM By: Shawn Stall RN, BSN Entered By: Shawn Stall on 12/08/2022 14:12:04 Kizzie Furnish ([id number]) [id number] Page 2 of 6 -------------------------------------------------------------------------------- Lower Extremity Assessment Details Patient Name: Date of Service: Allen Gregory [place] RD 12/08/2022 1:30 PM Medical Record Number: [id number] Patient Account Number: [id number] Date of Birth/Sex: Treating RN: 1961-04-03 (62 y.o. Allen Gregory Primary Care [name]: [name], NO Other Clinician: Referring [name]: Treating Skylor Schnapp/Extender: Diona Browner in Treatment: 2 Edema Assessment Assessed: [Left: Yes] [Right: No] Edema: [Left: N] [Right: o] Calf Left: Right: Point of Measurement: 32 cm From Medial Instep 49 cm Ankle Left: Right: Point of Measurement: 12 cm From Medial Instep 26.5 cm Vascular Assessment Pulses: Dorsalis Pedis Palpable: [Left:Yes] Electronic Signature(s) Signed: 12/08/2022 3:27:24 PM By: Shawn Stall RN, BSN Entered By: Shawn Stall on 12/08/2022 13:51:43 -------------------------------------------------------------------------------- Multi-Disciplinary Care Plan Details Patient Name: Date of Service: Allen Gregory NA RD 12/08/2022 1:30 PM Medical Record Number: [id number] Patient Account Number: [id number] Date of Birth/Sex: Treating RN: [birth year] (62 y.o. Allen Gregory Primary Care [name]: [name], NO Other Clinician: Referring [name]: Treating [name]/Extender: Diona Browner in Treatment: 2 Active Inactive Pain, Acute or Chronic Nursing Diagnoses: Pain, acute or chronic: actual or  potential Potential alteration in comfort, pain Goals: Patient will verbalize adequate pain control and receive pain control interventions during procedures as needed Date Initiated: 11/24/2022 Target Resolution Date: 02/19/2023 Goal Status: Active Patient/caregiver will verbalize comfort level met Date Initiated: 11/24/2022 Target Resolution Date: 02/19/2023 Goal Status: Active AKSH, SWART ([id number]) [phone].pdf Page 3 of 6 Interventions: Encourage patient to take pain medications as prescribed Provide education on pain management Treatment Activities: Administer pain control measures as ordered : 11/24/2022 Notes: Wound/Skin Impairment Nursing Diagnoses: Knowledge deficit related to ulceration/compromised skin  integrity Goals: Patient/caregiver will verbalize understanding of skin care regimen Date Initiated: 11/24/2022 Target Resolution Date: 02/19/2023 Goal Status: Active Interventions: Assess patient/caregiver ability to perform ulcer/skin care regimen upon admission and as needed Assess ulceration(s) every visit Provide education on ulcer and skin care Treatment Activities: Skin care regimen initiated : 11/24/2022 Topical wound management initiated : 11/24/2022 Notes: Electronic Signature(s) Signed: 12/08/2022 3:27:24 PM By: Shawn Stall RN, BSN Entered By: Shawn Stall on 12/08/2022 13:33:22 -------------------------------------------------------------------------------- Pain Assessment Details Patient Name: Date of Service: Allen Gregory NA RD 12/08/2022 1:30 PM Medical Record Number: [id number] Patient Account Number: [id number] Date of Birth/Sex: Treating RN: 1960/09/05 (62 y.o. M) Primary Care Naod Sweetland: [name], NO Other Clinician: Referring Rice Walsh: Treating [name]/Extender: Diona Browner in Treatment: 2 Active Problems Location of Pain Severity and Description of Pain Patient Has Paino No Site Locations Pain  Management and Medication Current Pain ManagementCHAUNCEY, SCIULLI ([id number]) [id number] Page 4 of 6 Electronic Signature(s) Signed: 01/12/2023 2:27:14 PM By: Karl Ito Entered By: Karl Ito on 12/08/2022 13:33:35 -------------------------------------------------------------------------------- Patient/Caregiver Education Details Patient Name: Date of Service: Allen Gregory NA RD 6/19/2024andnbsp1:30 PM Medical Record Number: [id number] Patient Account Number: [id number] Date of Birth/Gender: Treating RN: 04-24-1961 (61 y.o. Allen Gregory Primary Care Physician: [name], NO Other Clinician: Referring Physician: Treating Physician/Extender: Diona Browner in Treatment: 2 Education Assessment Education Provided To: Patient Education Topics Provided Wound/Skin Impairment: Handouts: Caring for Your Ulcer Methods: Explain/Verbal Responses: Reinforcements needed Electronic Signature(s) Signed: 12/08/2022 3:27:24 PM By: Shawn Stall RN, BSN Entered By: Shawn Stall on 12/08/2022 13:33:31 -------------------------------------------------------------------------------- Wound Assessment Details Patient Name: Date of Service: Allen Gregory NA RD 12/08/2022 1:30 PM Medical Record Number: [id number] Patient Account Number: [id number] Date of Birth/Sex: Treating RN: 01-Mar-1961 (61 y.o. M) Primary Care Shaolin Armas: [name], NO Other Clinician: Referring Dung Prien: Treating Amarrion Pastorino/Extender: Diona Browner in Treatment: 2 Wound Status Wound Number: 5 Primary Diabetic Wound/Ulcer of the Lower Extremity Etiology: Wound Location: Left, Plantar Foot Wound Status: Open Wounding Event: Blister Comorbid Pneumothorax, Deep Vein Thrombosis, Type II Diabetes, Date Acquired: 10/21/2022 History: Neuropathy Weeks Of Treatment: 2 Clustered Wound: No Photos Kizzie Furnish ([id number])  [id number] Page 5 of 6 Wound Measurements Length: (cm) 0.1 Width: (cm) 0.1 Depth: (cm) 0.1 Area: (cm) 0.008 Volume: (cm) 0.001 % Reduction in Area: 99.8% % Reduction in Volume: 99.9% Epithelialization: Large (67-100%) Tunneling: No Undermining: No Wound Description Classification: Unable to visualize wound bed Wound Margin: Distinct, outline attached Exudate Amount: None Present Foul Odor After Cleansing: No Slough/Fibrino No Wound Bed Granulation Amount: None Present (0%) Exposed Structure Necrotic Amount: None Present (0%) Fascia Exposed: No Fat Layer (Subcutaneous Tissue) Exposed: Yes Tendon Exposed: No Muscle Exposed: No Joint Exposed: No Bone Exposed: No Periwound Skin Texture Texture Color No Abnormalities Noted: No No Abnormalities Noted: No Callus: Yes Atrophie Blanche: No Crepitus: No Cyanosis: No Excoriation: No Ecchymosis: No Induration: No Erythema: No Rash: No Hemosiderin Staining: No Scarring: No Mottled: No Pallor: No Moisture Rubor: No No Abnormalities Noted: No Dry / Scaly: Yes Maceration: No Electronic Signature(s) Signed: 12/08/2022 3:27:24 PM By: Shawn Stall RN, BSN Entered By: Shawn Stall on 12/08/2022 13:51:55 -------------------------------------------------------------------------------- Vitals Details Patient Name: Date of Service: Allen Gregory, LEO NA RD 12/08/2022 1:30 PM Medical Record Number: [id number] Patient Account Number: [id number] Date of Birth/Sex: Treating RN: 12-09-1960 (61 y.o. M) Primary Care Delynn Pursley: [name], NO Other Clinician: Referring Fallan Mccarey: Treating Calin Ellery/Extender: Larina Bras  III, Cathleen Fears, Molli Hazard Weeks in Treatment: 2 Vital Signs Time Taken: 13:33 Temperature (F): 98.3 Height (in): 78 Pulse (bpm): 91 Hudon, Carlis ([id number]) [id number] Page 6 of 6 Weight (lbs): 310 Respiratory Rate (breaths/min): 18 Body Mass Index (BMI): 35.8 Blood  Pressure (mmHg): 125/78 Reference Range: 80 - 120 mg / dl Electronic Signature(s) Signed: 01/12/2023 2:27:14 PM By: Karl Ito Entered By: Karl Ito on 12/08/2022 13:33:29

## 2023-01-17 ENCOUNTER — Ambulatory Visit: Payer: 59 | Admitting: Podiatry

## 2023-07-11 ENCOUNTER — Other Ambulatory Visit: Payer: Self-pay | Admitting: Podiatry

## 2023-11-15 ENCOUNTER — Ambulatory Visit (INDEPENDENT_AMBULATORY_CARE_PROVIDER_SITE_OTHER)

## 2023-11-15 ENCOUNTER — Ambulatory Visit (INDEPENDENT_AMBULATORY_CARE_PROVIDER_SITE_OTHER): Admitting: Podiatry

## 2023-11-15 ENCOUNTER — Encounter: Payer: Self-pay | Admitting: Podiatry

## 2023-11-15 DIAGNOSIS — L97522 Non-pressure chronic ulcer of other part of left foot with fat layer exposed: Secondary | ICD-10-CM

## 2023-11-15 DIAGNOSIS — M869 Osteomyelitis, unspecified: Secondary | ICD-10-CM

## 2023-11-15 DIAGNOSIS — E08621 Diabetes mellitus due to underlying condition with foot ulcer: Secondary | ICD-10-CM | POA: Diagnosis not present

## 2023-11-15 MED ORDER — AMOXICILLIN-POT CLAVULANATE 875-125 MG PO TABS: 1.0000 | ORAL_TABLET | Freq: Two times a day (BID) | ORAL | 0 refills | Status: AC

## 2023-11-15 MED ORDER — MUPIROCIN 2 % EX OINT
1.0000 | TOPICAL_OINTMENT | Freq: Two times a day (BID) | CUTANEOUS | 2 refills | Status: AC
Start: 2023-11-15 — End: ?

## 2023-11-15 NOTE — Progress Notes (Unsigned)
 Subjective:   Patient ID: Allen Gregory, male   DOB: 63 y.o.   MRN: [id number]   HPI Chief Complaint  Patient presents with   Foot Ulcer    RM#11 Left foot pinky ulcer just completed antibiotics has gotten better once on them. Minor drainage no pain reported.   63 year old male presents the office today for concerns of a wound to the left fifth toe.  Was seen by his primary care doctor and started on Augmentin  and the wound has gotten better.  Since starting antibiotics has been moderate drainage and he feels the wound has been healing.  His last A1c was around 7.  He currently does not report any fevers or chills.   Review of Systems  All other systems reviewed and are negative.  History reviewed. No pertinent past medical history.  History reviewed. No pertinent surgical history.   Current Outpatient Medications:    amoxicillin -clavulanate (AUGMENTIN ) 875-125 MG tablet, Take 1 tablet by mouth 2 (two) times daily., Disp: 20 tablet, Rfl: 0   dapagliflozin propanediol (FARXIGA) 10 MG TABS tablet, Take by mouth., Disp: , Rfl:    hydrochlorothiazide (HYDRODIURIL) 25 MG tablet, Take 1 tablet by mouth daily., Disp: , Rfl:    losartan (COZAAR) 100 MG tablet, TAKE ONE TABLET (100 MG DOSE) BY MOUTH DAILY., Disp: , Rfl:    mupirocin  ointment (BACTROBAN ) 2 %, Apply 1 Application topically 2 (two) times daily., Disp: 30 g, Rfl: 2   OZEMPIC, 0.25 OR 0.5 MG/DOSE, 2 MG/3ML SOPN, Inject into the skin., Disp: , Rfl:    scopolamine (TRANSDERM-SCOP) 1 MG/3DAYS, Place patch on skin 1 day prior to travel; replace every 72 hours until travel is completed., Disp: , Rfl:    sildenafil (VIAGRA) 100 MG tablet, Take 1 tablet by mouth daily as needed., Disp: , Rfl:    silver  sulfADIAZINE  (SILVADENE ) 1 % cream, APPLY 1 APPLICATION TOPICALLY DAILY, Disp: 50 g, Rfl: 0   XARELTO 20 MG TABS tablet, Take 1 tablet by mouth daily., Disp: , Rfl:    amoxicillin -clavulanate (AUGMENTIN ) 875-125 MG tablet, Take 1 tablet by  mouth 2 (two) times daily. (Patient not taking: Reported on 11/15/2023), Disp: 20 tablet, Rfl: 0  Allergies  Allergen Reactions   Lisinopril     Other Reaction(s): Cough, Not available           Objective:  Physical Exam  ***     Assessment:  ***     Plan:  ***

## 2023-11-17 ENCOUNTER — Ambulatory Visit: Admitting: Podiatry

## 2023-11-18 NOTE — Addendum Note (Signed)
 Addended by: Bobbie Burows R on: 11/18/2023 03:21 PM   Modules accepted: Level of Service

## 2023-12-09 ENCOUNTER — Ambulatory Visit (INDEPENDENT_AMBULATORY_CARE_PROVIDER_SITE_OTHER): Admitting: Podiatry

## 2023-12-09 VITALS — BP 138/80 | HR 95 | Temp 97.6°F

## 2023-12-09 DIAGNOSIS — M869 Osteomyelitis, unspecified: Secondary | ICD-10-CM

## 2023-12-09 NOTE — Progress Notes (Signed)
  Subjective:   Patient ID: Allen Gregory, male   DOB: 63 y.o.   MRN: [id number]   HPI Chief Complaint  Patient presents with   Foot Ulcer    RM#11 Follow up on left foot ulcer.    63 year old male presents the office today for concerns of a wound to the left fifth toe.  He states that has been doing well he thinks the area is closed.  He was wearing the offloading shoe with since the wound is healed he has gone back to wearing his regular shoe.  He has not seen any drainage, bleeding or any purulence.  No pain.  Denies any fevers or chills.     Review of Systems  All other systems reviewed and are negative.  History reviewed. No pertinent past medical history.  History reviewed. No pertinent surgical history.         Objective:  Physical Exam  General: AAO x3, NAD  Dermatological: On the left fifth toe the toe there is still localized edema present of the toe but there is no significant erythema, cellulitis.  There is a hyperkeratotic lesion on the lateral aspect and upon debridement there is no underlying ulceration, drainage or signs of infection however it is still preulcerative.  There is no fluctuation or crepitation.  Vascular: Dorsalis Pedis artery and Posterior Tibial artery pedal pulses are palpable bilateral with immedate capillary fill time. There is no pain with calf compression, swelling, warmth, erythema.   Neruologic: Grossly intact via light touch bilateral.  Musculoskeletal: There is present left fifth toe      Assessment:   Left fifth toe ulcer with cellulitis and concern for possible osteomyelitis     Plan:  -Treatment options discussed including all alternatives, risks, and complications -Etiology of symptoms were discussed -Sharply debrided the hyperkeratotic lesion without any complications or bleeding.  Continue offloading.  Given the continuation of edema, somewhat improved we will still proceed with MRI to rule out underlying.  Unable to  appreciate any obvious clinical signs of infection otherwise today needs to monitor closely.  Should any signs or symptoms of infection and/or reoccurrence of the wound occur to let me know.  No follow-ups on file.  Donnice JONELLE Fees DPM
